# Patient Record
Sex: Female | Born: 1975 | Hispanic: No | Marital: Married | Smoking: Never smoker
Health system: Southern US, Community
[De-identification: ages and names within clinical notes are randomized; demographics above are authoritative.]

## PROBLEM LIST (undated history)

## (undated) ENCOUNTER — Inpatient Hospital Stay (HOSPITAL_COMMUNITY): Payer: Self-pay

## (undated) DIAGNOSIS — L309 Dermatitis, unspecified: Secondary | ICD-10-CM

## (undated) DIAGNOSIS — E049 Nontoxic goiter, unspecified: Secondary | ICD-10-CM

## (undated) DIAGNOSIS — Z87442 Personal history of urinary calculi: Secondary | ICD-10-CM

## (undated) DIAGNOSIS — Z8669 Personal history of other diseases of the nervous system and sense organs: Secondary | ICD-10-CM

## (undated) DIAGNOSIS — G8929 Other chronic pain: Secondary | ICD-10-CM

## (undated) DIAGNOSIS — E042 Nontoxic multinodular goiter: Secondary | ICD-10-CM

## (undated) DIAGNOSIS — M1990 Unspecified osteoarthritis, unspecified site: Secondary | ICD-10-CM

## (undated) DIAGNOSIS — K581 Irritable bowel syndrome with constipation: Secondary | ICD-10-CM

## (undated) DIAGNOSIS — S83207A Unspecified tear of unspecified meniscus, current injury, left knee, initial encounter: Secondary | ICD-10-CM

## (undated) DIAGNOSIS — K603 Anal fistula: Secondary | ICD-10-CM

## (undated) DIAGNOSIS — G4733 Obstructive sleep apnea (adult) (pediatric): Secondary | ICD-10-CM

## (undated) DIAGNOSIS — U071 COVID-19: Secondary | ICD-10-CM

## (undated) DIAGNOSIS — F329 Major depressive disorder, single episode, unspecified: Principal | ICD-10-CM

## (undated) DIAGNOSIS — K219 Gastro-esophageal reflux disease without esophagitis: Secondary | ICD-10-CM

## (undated) DIAGNOSIS — Z86011 Personal history of benign neoplasm of the brain: Secondary | ICD-10-CM

## (undated) DIAGNOSIS — N281 Cyst of kidney, acquired: Secondary | ICD-10-CM

## (undated) DIAGNOSIS — Z8711 Personal history of peptic ulcer disease: Secondary | ICD-10-CM

## (undated) DIAGNOSIS — R51 Headache: Secondary | ICD-10-CM

## (undated) DIAGNOSIS — N200 Calculus of kidney: Secondary | ICD-10-CM

## (undated) DIAGNOSIS — K649 Unspecified hemorrhoids: Secondary | ICD-10-CM

## (undated) DIAGNOSIS — R058 Other specified cough: Secondary | ICD-10-CM

## (undated) DIAGNOSIS — F419 Anxiety disorder, unspecified: Principal | ICD-10-CM

## (undated) DIAGNOSIS — Z8719 Personal history of other diseases of the digestive system: Secondary | ICD-10-CM

## (undated) DIAGNOSIS — G932 Benign intracranial hypertension: Secondary | ICD-10-CM

## (undated) DIAGNOSIS — F32A Depression, unspecified: Secondary | ICD-10-CM

## (undated) DIAGNOSIS — R519 Headache, unspecified: Secondary | ICD-10-CM

## (undated) HISTORY — PX: OTHER SURGICAL HISTORY: SHX169

## (undated) HISTORY — DX: Unspecified hemorrhoids: K64.9

## (undated) HISTORY — DX: Gastro-esophageal reflux disease without esophagitis: K21.9

## (undated) HISTORY — DX: Anxiety disorder, unspecified: F41.9

## (undated) HISTORY — DX: Nontoxic goiter, unspecified: E04.9

## (undated) HISTORY — DX: Major depressive disorder, single episode, unspecified: F32.9

## (undated) HISTORY — DX: Anal fistula: K60.3

---

## 1998-10-19 ENCOUNTER — Emergency Department (HOSPITAL_COMMUNITY): Admission: EM | Admit: 1998-10-19 | Discharge: 1998-10-19 | Payer: Self-pay | Admitting: Emergency Medicine

## 1998-10-19 ENCOUNTER — Encounter: Payer: Self-pay | Admitting: Emergency Medicine

## 1998-10-21 ENCOUNTER — Encounter: Admission: RE | Admit: 1998-10-21 | Discharge: 1998-10-21 | Payer: Self-pay | Admitting: Urology

## 1998-10-31 ENCOUNTER — Encounter: Payer: Self-pay | Admitting: Urology

## 1998-10-31 ENCOUNTER — Encounter: Admission: RE | Admit: 1998-10-31 | Discharge: 1998-10-31 | Payer: Self-pay | Admitting: Urology

## 2000-04-11 ENCOUNTER — Encounter: Payer: Self-pay | Admitting: Family Medicine

## 2000-04-11 ENCOUNTER — Ambulatory Visit (HOSPITAL_COMMUNITY): Admission: RE | Admit: 2000-04-11 | Discharge: 2000-04-11 | Payer: Self-pay | Admitting: Family Medicine

## 2000-06-16 ENCOUNTER — Ambulatory Visit (HOSPITAL_COMMUNITY): Admission: RE | Admit: 2000-06-16 | Discharge: 2000-06-16 | Payer: Self-pay | Admitting: Neurology

## 2000-06-16 ENCOUNTER — Encounter: Payer: Self-pay | Admitting: Neurology

## 2000-07-22 ENCOUNTER — Encounter: Admission: RE | Admit: 2000-07-22 | Discharge: 2000-10-20 | Payer: Self-pay | Admitting: Neurology

## 2000-08-05 ENCOUNTER — Ambulatory Visit (HOSPITAL_COMMUNITY): Admission: RE | Admit: 2000-08-05 | Discharge: 2000-08-05 | Payer: Self-pay | Admitting: Neurology

## 2002-01-11 HISTORY — PX: CHOLECYSTECTOMY: SHX55

## 2002-11-03 ENCOUNTER — Ambulatory Visit (HOSPITAL_COMMUNITY): Admission: RE | Admit: 2002-11-03 | Discharge: 2002-11-03 | Payer: Self-pay | Admitting: Internal Medicine

## 2002-11-03 ENCOUNTER — Encounter: Payer: Self-pay | Admitting: Internal Medicine

## 2002-11-19 ENCOUNTER — Ambulatory Visit (HOSPITAL_COMMUNITY): Admission: RE | Admit: 2002-11-19 | Discharge: 2002-11-19 | Payer: Self-pay | Admitting: Internal Medicine

## 2003-01-03 ENCOUNTER — Observation Stay (HOSPITAL_COMMUNITY): Admission: RE | Admit: 2003-01-03 | Discharge: 2003-01-04 | Payer: Self-pay | Admitting: General Surgery

## 2003-01-03 ENCOUNTER — Encounter (INDEPENDENT_AMBULATORY_CARE_PROVIDER_SITE_OTHER): Payer: Self-pay | Admitting: *Deleted

## 2003-01-03 HISTORY — PX: LAPAROSCOPIC CHOLECYSTECTOMY: SUR755

## 2003-11-03 ENCOUNTER — Emergency Department (HOSPITAL_COMMUNITY): Admission: EM | Admit: 2003-11-03 | Discharge: 2003-11-03 | Payer: Self-pay | Admitting: Family Medicine

## 2004-10-05 ENCOUNTER — Ambulatory Visit: Payer: Self-pay | Admitting: Nurse Practitioner

## 2004-10-15 ENCOUNTER — Ambulatory Visit: Payer: Self-pay | Admitting: *Deleted

## 2004-11-27 ENCOUNTER — Ambulatory Visit: Payer: Self-pay | Admitting: Nurse Practitioner

## 2004-12-02 ENCOUNTER — Ambulatory Visit: Payer: Self-pay | Admitting: Nurse Practitioner

## 2004-12-07 ENCOUNTER — Ambulatory Visit (HOSPITAL_COMMUNITY): Admission: RE | Admit: 2004-12-07 | Discharge: 2004-12-07 | Payer: Self-pay | Admitting: Internal Medicine

## 2004-12-11 ENCOUNTER — Ambulatory Visit: Payer: Self-pay | Admitting: Nurse Practitioner

## 2005-02-12 ENCOUNTER — Ambulatory Visit: Payer: Self-pay | Admitting: Nurse Practitioner

## 2005-03-16 ENCOUNTER — Ambulatory Visit: Payer: Self-pay | Admitting: Family Medicine

## 2005-03-16 ENCOUNTER — Ambulatory Visit (HOSPITAL_COMMUNITY): Admission: RE | Admit: 2005-03-16 | Discharge: 2005-03-16 | Payer: Self-pay | Admitting: Nurse Practitioner

## 2005-04-06 ENCOUNTER — Ambulatory Visit: Payer: Self-pay | Admitting: Nurse Practitioner

## 2005-05-05 ENCOUNTER — Ambulatory Visit: Payer: Self-pay | Admitting: Internal Medicine

## 2005-06-09 ENCOUNTER — Ambulatory Visit: Payer: Self-pay | Admitting: Internal Medicine

## 2009-02-07 ENCOUNTER — Emergency Department (HOSPITAL_COMMUNITY): Admission: EM | Admit: 2009-02-07 | Discharge: 2009-02-07 | Payer: Self-pay | Admitting: Emergency Medicine

## 2009-02-11 ENCOUNTER — Emergency Department (HOSPITAL_COMMUNITY): Admission: EM | Admit: 2009-02-11 | Discharge: 2009-02-11 | Payer: Self-pay | Admitting: Family Medicine

## 2010-05-29 NOTE — Procedures (Signed)
. [facility]  Patient:    Stephanie Davies                          MRN: [id number] Adm. Date:  [id number] Attending:  Erich Montane                           Procedure Report  ADDRESS:  [address], [place], [place] [address].  DESCRIPTION OF PROCEDURE:  The patient was prepped and draped in the left lateral decubitus position with Betadine.  The L4-5 interspace was entered without difficulty.  The opening pressure was 245 mmH2O.  Approximately 12 cc of CSF was removed and sent for studies, including cell count, differential, protein, glucose, and cryptococcal antigens, with two tubes to be held for further evaluation.  The patient tolerated the procedure well. DD:  08/05/00 TD:  08/05/00 Job: [id number]

## 2010-05-29 NOTE — Op Note (Signed)
NAMEJeanann Davies                             ACCOUNT NO.:  [id number]   MEDICAL RECORD NO.:  [id number]                   PATIENT TYPE:  OBV   LOCATION:  0340                                 FACILITY:  [facility]   PHYSICIAN:  Angelia Mould. Derrell Lolling, M.D.             DATE OF BIRTH:  02/15/1975   DATE OF PROCEDURE:  01/03/2003  DATE OF DISCHARGE:                                 OPERATIVE REPORT   PREOPERATIVE DIAGNOSES:  Chronic cholecystitis with cholelithiasis.   POSTOPERATIVE DIAGNOSES:  Chronic cholecystitis with cholelithiasis.   OPERATION PERFORMED:  Laparoscopic cholecystectomy with intraoperative  cholangiogram.   SURGEON:  Angelia Mould. Derrell Lolling, M.D.   FIRST ASSISTANT:  Anselm Pancoast. Zachery Dakins, M.D.   OPERATIVE INDICATIONS:  This is a 35 year old [place] female, who was sent  to me with a two month history of intermittent episodes of epigastric pain  and nausea.  She has occasional vomiting.  Her lab work and specifically her  liver function tests are normal.  A gallbladder ultrasound shows single, 14  mm gallstone and no other findings.  She is brought to the operating room  electively for cholecystectomy.   OPERATIVE FINDINGS:  The gallbladder was chronically inflamed.  There were  extensive adhesions of omentum to the gallbladder which required a fairly  extensive dissection.  The anatomy of the cystic duct and the cystic artery  and common bile duct were conventional.  The cystic duct was tiny but was  patent.  The cholangiogram showed normal intrahepatic and extrahepatic  biliary anatomy, no filling defects, and good flow of contrast into the  duodenum with no evidence of obstruction.  The liver looked normal.  The  stomach and duodenum, small intestine, large intestine were normal.  The  right ovary had a functional cyst on it.  The uterus could not be visualized  because the bladder was distended.   OPERATIVE TECHNIQUE:  Following the induction of general endotracheal  anesthesia, the patient's abdomen was prepped and draped in a sterile  fashion.  Marcaine 0.5% with epinephrine was used as a local infiltration  anesthetic.  A vertically-oriented incision was made at the lower rim of the  umbilicus.  The fascia was incised in the midline and the abdominal cavity  entered under direct vision.  A 10 mm Hasson trocar was inserted and secured  with a pursestring suture of 0 Vicryl.  Pneumoperitoneum was created.  Video  camera was inserted with visualization and findings as described above.  A  10 mm trocar was placed in the subxiphoid region and two 5 mm trocars placed  in the right mid abdomen.  The patient was positioned.  The gallbladder  fundus was elevated.  We took down all the adhesions off the gallbladder,  dissected out the infundibulum of the gallbladder.  We then dissected out  the cystic duct and the cystic artery.  The cystic artery was divided into  two branches.  The anterior branch was isolated as it wound around the  cystic duct, was secured with metal clips and divided.  The posterior branch  was then visualized as it went onto the gallbladder wall posteriorly, was  isolated, secured with multiple metal clips and divided.  This created a  large window behind the cystic duct.  The cystic duct was secured with a  metal clip close to the gallbladder.  The cholangiogram catheter was  inserted into the cystic duct.  A cholangiogram was obtained using the C-  arm.  The intrahepatic and extrahepatic biliary anatomy was normal.  There  were no filling defects, and there was good flow of contrast into the  duodenum on the cholangiogram.  The cholangiogram catheter was removed.  The  cystic duct was secured with multiple metal clips and divided.  The  gallbladder was dissected from its bed with electrocautery and removed  through the umbilical port.  The operative field was inspected and irrigated  a little bit.  It was completely dry.  The irrigation  fluid was completely  clear.  There was no evidence of any bleeding or bile leak whatsoever.  The  trocars were removed under direct vision, and there was no bleeding from the  trocar sites.  The pneumoperitoneum was released.  The fascia at the  umbilicus was closed with 0 Vicryl sutures.  The skin incisions were closed  with subcuticular sutures of 4-0 Vicryl and Steri-Strips.  Clean bandages  were placed and the patient taken to the recovery room in stable condition.  Estimated blood loss was about 10 cc.  Complications none.  Sponge, needle,  and instrument counts were correct.                                               Angelia Mould. Derrell Lolling, M.D.    HMI/MEDQ  D:  01/03/2003  T:  01/03/2003  Job:  [id number]   cc:   Tresa Endo L. Philipp Deputy, M.D.  [phone] [address]  [place] [address]  Fax: [phone]

## 2010-11-24 ENCOUNTER — Encounter: Payer: Self-pay | Admitting: Gastroenterology

## 2010-12-14 ENCOUNTER — Ambulatory Visit: Payer: Self-pay | Admitting: Gastroenterology

## 2010-12-30 ENCOUNTER — Ambulatory Visit: Payer: Self-pay | Admitting: Gastroenterology

## 2010-12-31 ENCOUNTER — Other Ambulatory Visit: Payer: Self-pay | Admitting: Specialist

## 2010-12-31 ENCOUNTER — Telehealth: Payer: Self-pay | Admitting: Gastroenterology

## 2010-12-31 DIAGNOSIS — G43909 Migraine, unspecified, not intractable, without status migrainosus: Secondary | ICD-10-CM

## 2010-12-31 DIAGNOSIS — G8929 Other chronic pain: Secondary | ICD-10-CM

## 2010-12-31 DIAGNOSIS — G932 Benign intracranial hypertension: Secondary | ICD-10-CM

## 2010-12-31 NOTE — Telephone Encounter (Signed)
Message copied by Arna Snipe on Thu Dec 31, 2010  4:57 PM ------      Message from: Donata Duff      Created: Wed Dec 30, 2010 10:52 AM       Please bill pt

## 2011-01-14 ENCOUNTER — Ambulatory Visit
Admission: RE | Admit: 2011-01-14 | Discharge: 2011-01-14 | Disposition: A | Discharge status: Home / Self Care | Payer: PRIVATE HEALTH INSURANCE | Source: Ambulatory Visit | Attending: Specialist | Admitting: Specialist

## 2011-01-14 ENCOUNTER — Other Ambulatory Visit: Payer: Self-pay | Admitting: Specialist

## 2011-01-14 DIAGNOSIS — G43909 Migraine, unspecified, not intractable, without status migrainosus: Secondary | ICD-10-CM

## 2011-01-14 DIAGNOSIS — G8929 Other chronic pain: Secondary | ICD-10-CM

## 2011-01-14 DIAGNOSIS — G932 Benign intracranial hypertension: Secondary | ICD-10-CM

## 2011-01-14 LAB — CSF CELL COUNT WITH DIFFERENTIAL
RBC Count, CSF: 0 cu mm
Tube #: 4
WBC, CSF: 0 cu mm (ref 0–5)

## 2011-01-14 LAB — PROTEIN, CSF: Total Protein, CSF: 29 mg/dL (ref 15–45)

## 2011-01-14 LAB — GLUCOSE, CSF: Glucose, CSF: 58 mg/dL (ref 43–76)

## 2011-01-14 NOTE — Patient Instructions (Signed)
Lumbar Puncture Discharge Instructions  1. Go home and rest quietly for the next 24 hours.  It is important to lie flat for the next 24 hours.  Get up only to go to the restroom.  You may lie in the bed or on a couch on your back, your stomach, your left side or your right side.  You may have one pillow under your head.  You may have pillows between your knees while you are on your side or under your knees while you are on your back.  2. DO NOT drive today.  Recline the seat as far back as it will go, while still wearing your seat belt, on the way home.  3. You may get up to go to the bathroom as needed.  You may sit up for 10 minutes to eat.  You may resume your normal diet and medications unless otherwise indicated. Drink lots of extra fluids for the next few days.  4. The incidence of headache, nausea, or vomiting is about 5% (one in 20 patients).  If you develop a headache, lie flat and drink plenty of fluids until the headache goes away.  Caffeinated beverages may be helpful.  If you develop severe nausea and vomiting or a headache that does not go away with flat bed rest, call [phone].  5. You may resume normal activities after your 24 hours of bed rest is over; however, do not exert yourself strongly or do any heavy lifting tomorrow.  6. Call your physician for a follow-up appointment.   7. If you have any questions or if complications develop after you arrive home, please call [phone].  Discharge instructions have been explained to the patient.  The patient, or the person responsible for the patient, fully understands these instructions.

## 2011-01-16 ENCOUNTER — Encounter (HOSPITAL_COMMUNITY): Payer: Self-pay | Admitting: Emergency Medicine

## 2011-01-16 ENCOUNTER — Emergency Department (INDEPENDENT_AMBULATORY_CARE_PROVIDER_SITE_OTHER)
Admission: EM | Admit: 2011-01-16 | Discharge: 2011-01-16 | Disposition: A | Discharge status: Home / Self Care | Payer: PRIVATE HEALTH INSURANCE | Source: Home / Self Care | Attending: Family Medicine | Admitting: Family Medicine

## 2011-01-16 DIAGNOSIS — G971 Other reaction to spinal and lumbar puncture: Secondary | ICD-10-CM

## 2011-01-16 LAB — ANGIOTENSIN CONVERTING ENZYME, CSF: ACE, CSF: 4 U/L (ref <=15)

## 2011-01-16 MED ORDER — ONDANSETRON HCL 4 MG PO TABS: 4.0000 mg | ORAL_TABLET | Freq: Three times a day (TID) | ORAL | Status: AC | PRN

## 2011-01-16 MED ORDER — OXYCODONE-ACETAMINOPHEN 5-325 MG PO TABS: ORAL_TABLET | ORAL | Status: AC

## 2011-01-16 NOTE — Discharge Instructions (Signed)
This could be a CSF leak from your recent lumbar puncture. Please call the center that performed the procedure for follow up. Until then, take medications as directed. Return to care should your symptoms not improve, or worsen in any way.

## 2011-01-16 NOTE — ED Provider Notes (Signed)
History     CSN: [id number]  Arrival date & time 01/16/11  1712   First MD Initiated Contact with Patient 01/16/11 1733      Chief Complaint  Patient presents with  . Migraine    (Consider location/radiation/quality/duration/timing/severity/associated sxs/prior treatment) HPI Comments: Stephanie Davies presents for evaluation of persistent headache after receiving a lumbar puncture 2 days ago. She states that she was referred by her neurologist for an MRI and lumbar puncture for evaluation of headaches (evaluating for migraine vs pseudotumor cerebri). She reports that she was told that she had elevated opening pressure, and some CSF was removed.   Patient is a 36 y.o. female presenting with headaches. The history is provided by the patient.  Headache The primary symptoms include headaches, dizziness and nausea. Primary symptoms do not include syncope, paresthesias, focal weakness, fever or vomiting. The symptoms began 2 days ago. The symptoms are unchanged. The neurological symptoms are diffuse.  The headache began 2 days ago. Headache is a new problem. The headache is present continuously. The pain from the headache is at a severity of 10/10. The headache is not associated with paresthesias or weakness.  Dizziness also occurs with nausea. Dizziness does not occur with vomiting or weakness.   Additional symptoms do not include weakness. Workup history includes MRI and lumbar puncture.    Past Medical History  Diagnosis Date  . Alopecia     Past Surgical History  Procedure Date  . Cholecystectomy     Family History  Problem Relation Age of Onset  . Esophageal cancer Maternal Grandmother   . Esophageal cancer Maternal Grandfather   . Diabetes Mother   . Diabetes Maternal Grandmother   . Diabetes Maternal Grandfather     History  Substance Use Topics  . Smoking status: Never Smoker   . Smokeless tobacco: Not on file  . Alcohol Use: No    OB History    Grav Para Term Preterm  Abortions TAB SAB Ect Mult Living                  Review of Systems  Constitutional: Negative.  Negative for fever.  Eyes: Negative.   Respiratory: Negative.   Cardiovascular: Negative.  Negative for syncope.  Gastrointestinal: Positive for nausea. Negative for vomiting.  Genitourinary: Negative.   Musculoskeletal: Negative.   Skin: Negative.   Neurological: Positive for dizziness and headaches. Negative for focal weakness, weakness, numbness and paresthesias.    Allergies  Review of patient's allergies indicates no known allergies.  Home Medications   Current Outpatient Rx  Name Route Sig Dispense Refill  . ONDANSETRON HCL 4 MG PO TABS Oral Take 1 tablet (4 mg total) by mouth every 8 (eight) hours as needed for nausea. 15 tablet 0  . OXYCODONE-ACETAMINOPHEN 5-325 MG PO TABS  Take one to two tablets every 4 to 6 hours as needed for pain 20 tablet 0    BP 138/90  Pulse 82  Temp(Src) 99.7 F (37.6 C) (Oral)  Resp 16  SpO2 99%  LMP 01/05/2011  Physical Exam  Nursing note and vitals reviewed. Constitutional: She is oriented to person, place, and time. She appears well-developed and well-nourished.  HENT:  Head: Normocephalic and atraumatic.  Right Ear: Tympanic membrane is retracted.  Left Ear: Tympanic membrane is retracted.  Mouth/Throat: Uvula is midline, oropharynx is clear and moist and mucous membranes are normal.  Eyes: Conjunctivae, EOM and lids are normal. Pupils are equal, round, and reactive to light.  Fundoscopic exam:  The right eye shows no hemorrhage and no papilledema.       The left eye shows no hemorrhage and no papilledema.  Neck: Normal range of motion.  Pulmonary/Chest: Effort normal and breath sounds normal. She has no wheezes.  Musculoskeletal: Normal range of motion.  Neurological: She is alert and oriented to person, place, and time.  Skin: Skin is warm and dry.  Psychiatric: Her behavior is normal.    ED Course  Procedures (including  critical care time)  Labs Reviewed - No data to display No results found.   1. Headache following lumbar puncture       MDM  Advised to follow up with imaging center that performed procedure for re-evaluation of possible CSF leak        Richardo Priest, MD 01/16/11 2059

## 2011-01-16 NOTE — ED Notes (Signed)
Pt here with severe frontal h/a radiating to top of head and bialt eyes that started last Thursday s/p lumbar puncture.pt states she seen neurologist for sx on thurs and mri done and was [facility]sx sharp constant shooting pain with blurry vision,dizziness and nausea.pt also states spinal fluid was 34 and should've been 12.

## 2011-01-16 NOTE — ED Notes (Signed)
Sx started Friday post puncture and mri

## 2011-01-21 ENCOUNTER — Encounter: Payer: Self-pay | Admitting: *Deleted

## 2011-01-21 ENCOUNTER — Encounter: Payer: PRIVATE HEALTH INSURANCE | Attending: Specialist | Admitting: *Deleted

## 2011-01-21 DIAGNOSIS — E669 Obesity, unspecified: Secondary | ICD-10-CM | POA: Insufficient documentation

## 2011-01-21 DIAGNOSIS — Z713 Dietary counseling and surveillance: Secondary | ICD-10-CM | POA: Insufficient documentation

## 2011-01-21 NOTE — Progress Notes (Signed)
  Medical Nutrition Therapy:  Appt start time: 1500 end time:  1600.   Assessment:  Primary concerns today: Patient here with her mother who listened throughout the visit. Patient states she is in school full time and also has a full time job. She is getting married in about 5 months and wants to lose as much weight as she can before the wedding so she "can look perfect on her wedding day". She also states she has headaches that have been associated with her obesity and that a quick weight loss in necessary to treat that condition too. She has already adjusted her eating habits significantly as demonstrated below. Her previous food choices included frequent fast food meals, excessive fried foods, sweets and especially large portions of chocolate candy daily. She is not currently physically active other than walking to her classes when in school. She also states she cannot do any activity that involves bending over due to headaches.   MEDICATIONS: see list   DIETARY INTAKE:  Usual eating pattern includes 3 meals and 1-3 snacks per day.  Everyday foods currently include good variety from all food groups.  Avoided foods include soda, juices, rice and pasta lately  caffeine  24-hr recall:  B ( AM): 2 boiled eggs, [place] yogurt, water  Snk ( AM): fresh fruit  L ( PM): salad, fried fish, grilled chicken or [place], mixed vegetables or soup (previously large portions of sweets, rice, pasta sodas) Snk ( PM): fresh fruit, yogurt, looking for something  Sweet (used to eat chocolate candy) D ( PM): yogurt, milk, occasionally salad (lettuce, tomato, onion, cucumber, lemon and salt) Snk ( PM): not usually, if so- an orange Beverages: water,   Usual physical activity: walks to classes when in school, otherwise no  Estimated energy needs: 1400 calories 158 g carbohydrates 105 g protein 39 g fat  Progress Towards Goal(s):  In progress.   Nutritional Diagnosis:  NI-1.5 Excessive energy intake As  related to obesity and lack of exercise.  As evidenced by BMI of 38.9%.    Intervention:  Nutrition counseling provided on adequate nutrition for weight loss of up to 2 pounds per week. Also discussed value of increasing her activity level and suggested several options including water aerobics and other aerobic choices. Cautioned her about her comment of looking perfect on her wedding day and that their marriage would be based on their relationship that they build together, not what each of them looks like on that particular day. Also cautioned her on the risks of losing too much weight too quickly in terms of muscle mass and potential headaches from too little food.  Goals:  Eat 3 meals/day, Avoid meal skipping   Consider following "Plate Method" for portion control and to increase vegetable servings  Limit carbohydrate1-2 servings/meal   Choose more whole grains, lean protein, low-fat dairy, and fruits/non-starchy vegetables.   Aim for >30 min of physical activity daily such as water aerobics, bicycling, fast walking as tolerated with headaches  Limit sugar-sweetened beverages and concentrated sweets  Read Food Labels for Total Carbohydrate, Fat and Sodium content of foods   Handouts given during visit include:  Carb Counting and Label Reading Handouts  Meal Plan Card  Monitoring/Evaluation:  Dietary intake, exercise, label reading, and body weight in 4 week(s).

## 2011-01-22 NOTE — Patient Instructions (Signed)
Goals:  Eat 3 meals/day, Avoid meal skipping   Consider following "Plate Method" for portion control and to increase vegetable servings  Limit carbohydrate1-2 servings/meal   Choose more whole grains, lean protein, low-fat dairy, and fruits/non-starchy vegetables.   Aim for >30 min of physical activity daily such as water aerobics, bicycling, fast walking as tolerated with headaches  Limit sugar-sweetened beverages and concentrated sweets  Read Food Labels for Total Carbohydrate, Fat and Sodium content of foods

## 2011-02-12 LAB — FUNGUS CULTURE W SMEAR: Smear Result: NONE SEEN

## 2011-02-22 ENCOUNTER — Ambulatory Visit: Payer: PRIVATE HEALTH INSURANCE | Admitting: *Deleted

## 2011-08-23 ENCOUNTER — Encounter (HOSPITAL_COMMUNITY): Payer: Self-pay | Admitting: Emergency Medicine

## 2011-08-23 ENCOUNTER — Encounter (HOSPITAL_COMMUNITY): Payer: Self-pay

## 2011-08-23 ENCOUNTER — Emergency Department (INDEPENDENT_AMBULATORY_CARE_PROVIDER_SITE_OTHER)
Admission: EM | Admit: 2011-08-23 | Discharge: 2011-08-23 | Disposition: A | Discharge status: Other Acute Inpatient Hospital | Payer: Self-pay | Source: Home / Self Care | Attending: Emergency Medicine | Admitting: Emergency Medicine

## 2011-08-23 ENCOUNTER — Emergency Department (HOSPITAL_COMMUNITY)
Admission: EM | Admit: 2011-08-23 | Discharge: 2011-08-24 | Disposition: A | Discharge status: Home / Self Care | Payer: PRIVATE HEALTH INSURANCE | Attending: Emergency Medicine | Admitting: Emergency Medicine

## 2011-08-23 DIAGNOSIS — G932 Benign intracranial hypertension: Secondary | ICD-10-CM

## 2011-08-23 DIAGNOSIS — R519 Headache, unspecified: Secondary | ICD-10-CM

## 2011-08-23 DIAGNOSIS — R51 Headache: Secondary | ICD-10-CM | POA: Insufficient documentation

## 2011-08-23 HISTORY — DX: Benign intracranial hypertension: G93.2

## 2011-08-23 LAB — POCT URINALYSIS DIP (DEVICE)
Bilirubin Urine: NEGATIVE
Glucose, UA: NEGATIVE mg/dL
Hgb urine dipstick: NEGATIVE
Ketones, ur: NEGATIVE mg/dL
Leukocytes, UA: NEGATIVE
Nitrite: NEGATIVE
Protein, ur: NEGATIVE mg/dL
Specific Gravity, Urine: 1.02 (ref 1.005–1.030)
Urobilinogen, UA: 0.2 mg/dL (ref 0.0–1.0)
pH: 5.5 (ref 5.0–8.0)

## 2011-08-23 LAB — POCT PREGNANCY, URINE: Preg Test, Ur: NEGATIVE

## 2011-08-23 MED ORDER — TETRACAINE HCL 0.5 % OP SOLN
1.0000 [drp] | Freq: Once | OPHTHALMIC | Status: AC
  Administered 2011-08-23: 1 [drp] via OPHTHALMIC
  Filled 2011-08-23: qty 2

## 2011-08-23 MED ORDER — IBUPROFEN 800 MG PO TABS
800.0000 mg | ORAL_TABLET | Freq: Once | ORAL | Status: AC
  Administered 2011-08-24: 800 mg via ORAL
  Filled 2011-08-23: qty 1

## 2011-08-23 NOTE — ED Provider Notes (Signed)
History     CSN: [id number]  Arrival date & time 08/23/11  1751   First MD Initiated Contact with Patient 08/23/11 2103      Chief Complaint  Patient presents with  . Headache    (Consider location/radiation/quality/duration/timing/severity/associated sxs/prior treatment) HPI Comments: Patient is a 36 year old female with a recent diagnosis of pseudotumor cerebri that presents to the emergency department from urgent care with a chief complaint of headache.  Patient is currently being followed by Dr. Neale Burly this.  Headache onset began approximately 3 weeks ago, location is bifrontal, characterized as gradually worsening, & described as a throbbing sensation associated with intermittent dizziness. She denies any nausea, vomiting, change in vision, tinnitus, paresthesias, numbness, weakness, tingling sensation, slurred speech, ataxia or disequilibrium.  Patient states her headaches were treated with a lumbar puncture 3 months ago which did not relieve her pain. Later received multiple trigger point injections which only temporarily relieved her headache.  Since this time her headache has been slowly coming back.  She has no other complaints at this time. She denies being placed on any medication for her ICP.   Patient is a 36 y.o. female presenting with headaches.  Headache  Pertinent negatives include no fever.    Past Medical History  Diagnosis Date  . Alopecia   . Pseudotumor cerebri     Past Surgical History  Procedure Date  . Cholecystectomy     Family History  Problem Relation Age of Onset  . Esophageal cancer Maternal Grandmother   . Esophageal cancer Maternal Grandfather   . Diabetes Mother   . Diabetes Maternal Grandmother   . Diabetes Maternal Grandfather     History  Substance Use Topics  . Smoking status: Never Smoker   . Smokeless tobacco: Not on file  . Alcohol Use: No    OB History    Grav Para Term Preterm Abortions TAB SAB Ect Mult Living        Review of Systems  Constitutional: Negative for fever, diaphoresis and activity change.  HENT: Negative for congestion and neck pain.   Eyes: Negative for photophobia, pain, discharge, redness, itching and visual disturbance.  Respiratory: Negative for cough.   Genitourinary: Negative for dysuria.  Musculoskeletal: Negative for myalgias.  Skin: Negative for color change and wound.  Neurological: Positive for dizziness and headaches. Negative for tremors, seizures, syncope, facial asymmetry, speech difficulty, weakness, light-headedness and numbness.  All other systems reviewed and are negative.    Allergies  Review of patient's allergies indicates no known allergies.  Home Medications  No current outpatient prescriptions on file.  BP 122/79  Pulse 93  Temp 98.5 F (36.9 C) (Oral)  Resp 18  SpO2 99%  LMP 08/14/2011  Physical Exam  Nursing note and vitals reviewed. Constitutional: She is oriented to person, place, and time. She appears well-developed and well-nourished. No distress.  HENT:  Head: Normocephalic and atraumatic.  Eyes: Conjunctivae and EOM are normal. Pupils are equal, round, and reactive to light. No scleral icterus.       IOPs < 20 bilaterally. No Visual field deficits.   Neck: Normal range of motion and full passive range of motion without pain. Neck supple. No JVD present. Carotid bruit is not present. No rigidity. No Brudzinski's sign noted.  Cardiovascular: Normal rate, regular rhythm, normal heart sounds and intact distal pulses.   Pulmonary/Chest: Effort normal and breath sounds normal. No respiratory distress. She has no wheezes. She has no rales.  Musculoskeletal: Normal range of motion.  Lymphadenopathy:    She has no cervical adenopathy.  Neurological: She is alert and oriented to person, place, and time. She has normal strength. No cranial nerve deficit or sensory deficit. She displays a negative Romberg sign. Coordination and gait normal. GCS  eye subscore is 4. GCS verbal subscore is 5. GCS motor subscore is 6.       A&O x3.  Able to follow commands. CN III-XII intact  Motor strength 5/5 bilaterally. Light touch intact in all 4 distal limbs.  Intact finger to nose, shin to heel and rapid alternating movements. No ataxia or dysequilibrium.   Skin: Skin is warm and dry. No rash noted. She is not diaphoretic.  Psychiatric: She has a normal mood and affect. Her behavior is normal.    ED Course  Procedures (including critical care time)  Labs Reviewed - No data to display No results found.   No diagnosis found.    MDM  HA  Patient with diagnosis of pseudotumor cerebri followed by Dr. Neale Burly presents to the emergency department with a gradually worsening headache over the last 3 weeks.  No focal neuro deficits or objective findings on physical exam.  Vision and intraocular pressures within normal limits.  Patient has been advised to followup with her neurologist and to return to the emergency department if symptoms worsen or she develops any visual changes.  Recommended low sodium diet and weight reduction and advised patient to discuss prophylactic medication with her physician. At this time there does not appear to be any evidence of an acute emergency medical condition and the patient appears stable for discharge with appropriate outpatient follow up. Pt case discussed with Dr. Judd Lien who agrees with my plan.          Jaci Carrel, [place] 08/23/11 2339

## 2011-08-23 NOTE — ED Notes (Signed)
Pt sent from [facility] for increased HA and dizziness x 3 weeks; pt with hx of pseudotumor cerebri and sent for further eval

## 2011-08-23 NOTE — ED Provider Notes (Signed)
Chief Complaint  Patient presents with  . Headache    History of Present Illness:   The patient is a 36 year old female with a history of pseudotumor cerebri who presents today with a three-week history of a constant bifrontal headache. This feels like a sharp pain and is rated 10 over 10 in intensity at times, however right now it is a 5/10. It's associated with nausea, eye pain, photophobia, and photophobia. She denies any vomiting. She's had no blurry vision or diplopia. No difficulty hearing, ringing in her ears, paresthesias, numbness, tingling, weakness, difficulty speaking, difficulty ambulating, or passing out spells. She also has had a three-week history of intermittent dizziness. This comes and goes and lasts for seconds at a time. The headache tends to get worse during the dizzy spells. There no specific precipitating or relieving factors. She denies any whirling vertigo, but feels like she is about to fall. Both her ears itch but she denies any ear pain. She does have a history of pseudotumor cerebri which was diagnosed about 6 months ago. She underwent an LP and has been followed by Dr. Santiago Glad. She's had ongoing headaches since then and received trigger point injections, but she states the headaches are worse the past 3 weeks. She denies any fever, chills, sweats, or stiff neck.  Review of Systems:  Other than noted above, the patient denies any of the following symptoms: Systemic:  No fever, chills, fatigue, photophobia, stiff neck. Eye:  No redness, eye pain, discharge, blurred vision, or diplopia. ENT:  No nasal congestion, rhinorrhea, sinus pressure or pain, sneezing, earache, or sore throat.  No jaw claudication. Neuro:  No paresthesias, loss of consciousness, seizure activity, muscle weakness, trouble with coordination or gait, trouble speaking or swallowing. Psych:  No depression, anxiety or trouble sleeping.  PMFSH:  Past medical history, family history, social history,  meds, and allergies were reviewed.  Physical Exam:   Vital signs:  BP 112/74  Pulse 98  Temp 98.4 F (36.9 C) (Oral)  Resp 18  SpO2 96%  LMP 08/14/2011 General:  Alert and oriented.  In no distress. Eye:  Lids and conjunctivas normal.  PERRL,  Full EOMs.  Fundi benign with normal discs and vessels. ENT:  No cranial or facial tenderness to palpation.  TMs and canals clear.  Nasal mucosa was normal and uncongested without any drainage. No intra oral lesions, pharynx clear, mucous membranes moist, dentition normal. Neck:  Supple, full ROM, no tenderness to palpation.  No adenopathy or mass. Neuro:  Alert and orented times 3.  Speech was clear, fluent, and appropriate.  Cranial nerves intact. No pronator drift, muscle strength normal. Finger to nose normal.  DTRs were 2+ and symmetrical.Station and gait were normal.  Romberg's sign was normal.  Able to perform tandem gait well. Psych:  Normal affect.  Assessment:  The encounter diagnosis was Pseudotumor cerebri. It sounds like her pseudotumor has come back again, and I feel she needs further evaluation, possibly a CT scan to assess.  Plan:   1.  The following meds were prescribed:   New Prescriptions   No medications on file   2.  The patient was transferred to the emergency department by shuttle.   Reuben Likes, MD 08/23/11 1745

## 2011-08-23 NOTE — ED Notes (Signed)
Stated history of pseudotumor cerebri ; c/o HA for past 3 weeks , no relief w OTC medications; c/o dizziness that comes and goes , pain in her ear, nausea, but no vomiting; LMP 8-3

## 2011-08-23 NOTE — ED Notes (Signed)
Pt c/o 10/10 HA, PA notified, new orders placed.

## 2011-08-24 NOTE — ED Provider Notes (Signed)
Medical screening examination/treatment/procedure(s) were performed by non-physician practitioner and as supervising physician I was immediately available for consultation/collaboration.  Issac Moure, MD 08/24/11 0130 

## 2013-01-11 NOTE — L&D Delivery Note (Signed)
Delivery Note  Second stage started at -2 pushed times 2 hours progress made to +2 descent and became frustrated, requested vacuum and refused to push any further.  Dr. Despina Hidden consulted to discuss vacuum. Discussion between patient and healthcare team to continue to encourage pushing due to adequate progress with decent. Dr. Despina Hidden and CNM remain at bedside throughout remainder of second stage. At 3:15 AM a viable and healthy female was delivered via Vaginal, Spontaneous Delivery (Presentation: ; R Occiput Anterior).  APGAR: 6, 8. Placenta status: delivered shultz , .  Cord: 3 vessels with the following complications: None.  Cord pH: attempted to obtain, unable to aspirate sufficient sample size.  Anesthesia: Epidural  Lacerations: 1st degree Suture Repair: 3.0 Monocryl Est. Blood Loss (mL):  Mom to postpartum.  Baby to Couplet care / Skin to Skin Supervised by Wynelle Bourgeois CNM.  Lind Covert 09/03/2013, 4:03 AM\  Attended delivery Agree with note Aviva Signs, CNM

## 2013-05-14 ENCOUNTER — Encounter (HOSPITAL_COMMUNITY): Payer: Self-pay | Admitting: Emergency Medicine

## 2013-05-14 ENCOUNTER — Emergency Department (INDEPENDENT_AMBULATORY_CARE_PROVIDER_SITE_OTHER)
Admission: EM | Admit: 2013-05-14 | Discharge: 2013-05-14 | Disposition: A | Discharge status: Home / Self Care | Payer: PRIVATE HEALTH INSURANCE | Source: Home / Self Care

## 2013-05-14 DIAGNOSIS — Z3490 Encounter for supervision of normal pregnancy, unspecified, unspecified trimester: Secondary | ICD-10-CM

## 2013-05-14 DIAGNOSIS — Z3201 Encounter for pregnancy test, result positive: Secondary | ICD-10-CM

## 2013-05-14 LAB — POCT PREGNANCY, URINE: Preg Test, Ur: POSITIVE — AB

## 2013-05-14 MED ORDER — PRENATAL VITAMINS 28-0.8 MG PO TABS: ORAL_TABLET | ORAL | Status: DC

## 2013-05-14 NOTE — ED Notes (Signed)
[place]. Needs pregnancy confirmation for services

## 2013-05-14 NOTE — Discharge Instructions (Signed)
Prenatal Care  °WHAT IS PRENATAL CARE?  °Prenatal care means health care during your pregnancy, before your baby is born. It is very important to take care of yourself and your baby during your pregnancy by:  °· Getting early prenatal care. If you know you are pregnant, or think you might be pregnant, call your health care provider as soon as possible. Schedule a visit for a prenatal exam. °· Getting regular prenatal care. Follow your health care provider's schedule for blood and other necessary tests. Do not miss appointments. °· Doing everything you can to keep yourself and your baby healthy during your pregnancy. °· Getting complete care. Prenatal care should include evaluation of the medical, dietary, educational, psychological, and social needs of you and your significant other. The medical and genetic history of your family and the family of your baby's father should be discussed with your health care provider. °· Discussing with your health care provider: °· Prescription, over-the-counter, and herbal medicines that you take. °· Any history of substance abuse, alcohol use, smoking, and illegal drug use. °· Any history of domestic abuse and violence. °· Immunizations you have received. °· Your nutrition and diet. °· The amount of exercise you do. °· Any environmental and occupational hazards to which you are exposed. °· History of sexually transmitted infections for both you and your partner. °· Previous pregnancies you have had. °WHY IS PRENATAL CARE SO IMPORTANT?  °By regularly seeing your health care provider, you help ensure that problems can be identified early so that they can be treated as soon as possible. Other problems might be prevented. Many studies have shown that early and regular prenatal care is important for the health of mothers and their babies.  °HOW CAN I TAKE CARE OF MYSELF WHILE I AM PREGNANT?  °Here are ways to take care of yourself and your baby:  °· Start or continue taking your  multivitamin with 400 micrograms (mcg) of folic acid every day. °· Get early and regular prenatal care. It is very important to see a health care provider during your pregnancy. Your health care provider will check at each visit to make sure that you and the baby are healthy. If there are any problems, action can be taken right away to help you and the baby. °· Eat a healthy diet that includes: °· Fruits. °· Vegetables. °· Foods low in saturated fat. °· Whole grains. °· Calcium-rich foods, such as milk, yogurt, and hard cheeses. °· Drink 6 to 8 glasses of liquids a day. °· Unless your health care provider tells you not to, try to be physically active for 30 minutes, most days of the week. If you are pressed for time, you can get your activity in through 10-minute segments, three times a day. °· Do not smoke, drink alcohol, or use drugs. These can cause long-term damage to your baby. Talk with your health care provider about steps to take to stop smoking. Talk with a member of your faith community, a counselor, a trusted friend, or your health care provider if you are concerned about your alcohol or drug use. °· Ask your health care provider before taking any medicine, even over-the-counter medicines. Some medicines are not safe to take during pregnancy. °· Get plenty of rest and sleep. °· Avoid hot tubs and saunas during pregnancy. °· Do not have X-rays taken unless absolutely necessary and with the recommendation of your health care provider. A lead shield can be placed on your abdomen to protect the   baby when X-rays are taken in other parts of the body. °· Do not empty the cat litter when you are pregnant. It may contain a parasite that causes an infection called toxoplasmosis, which can cause birth defects. Also, use gloves when working in garden areas used by cats. °· Do not eat uncooked or undercooked meats or fish. °· Do not eat soft, mold-ripened cheeses (Brie, Camembert, and chevre) or soft, blue-veined  cheese (Danish blue and Roquefort). °· Stay away from toxic chemicals like: °· Insecticides. °· Solvents (some cleaners or paint thinners). °· Lead. °· Mercury. °· Sexual intercourse may continue until the end of the pregnancy, unless you have a medical problem or there is a problem with the pregnancy and your health care provider tells you not to. °· Do not wear high-heel shoes, especially during the second half of the pregnancy. You can lose your balance and fall. °· Do not take long trips, unless absolutely necessary. Be sure to see your health care provider before going on the trip. °· Do not sit in one position for more than 2 hours when on a trip. °· Take a copy of your medical records when going on a trip. Know where a hospital is located in the city you are visiting, in case of an emergency. °· Most dangerous household products will have pregnancy warnings on their labels. Ask your health care provider about products if you are unsure. °· Limit or eliminate your caffeine intake from coffee, tea, sodas, medicines, and chocolate. °· Many women continue working through pregnancy. Staying active might help you stay healthier. If you have a question about the safety or the hours you work at your particular job, talk with your health care provider. °· Get informed: °· Read books. °· Watch videos. °· Go to childbirth classes for you and your significant other. °· Talk with experienced moms. °· Ask your health care provider about childbirth education classes for you and your partner. Classes can help you and your partner prepare for the birth of your baby. °· Ask about a baby doctor (pediatrician) and methods and pain medicine for labor, delivery, and possible cesarean delivery. °HOW OFTEN SHOULD I SEE MY HEALTH CARE PROVIDER DURING PREGNANCY?  °Your health care provider will give you a schedule for your prenatal visits. You will have visits more often as you get closer to the end of your pregnancy. An average  pregnancy lasts about 40 weeks.  °A typical schedule includes visiting your health care provider:  °· About once each month during your first 6 months of pregnancy. °· Every 2 weeks during the next 2 months. °· Weekly in the last month, until the delivery date. °Your health care provider will probably want to see you more often if: °· You are older than 35 years. °· Your pregnancy is high risk because you have certain health problems or problems with the pregnancy, such as: °· Diabetes. °· High blood pressure. °· The baby is not growing on schedule, according to the dates of the pregnancy. °Your health care provider will do special tests to make sure you and the baby are not having any serious problems. °WHAT HAPPENS DURING PRENATAL VISITS?  °· At your first prenatal visit, your health care provider will do a physical exam and talk to you about your health history and the health history of your partner and your family. Your health care provider will be able to tell you what date to expect your baby to be born on. °·   Your first physical exam will include checks of your blood pressure, measurements of your height and weight, and an exam of your pelvic organs. Your health care provider will do a Pap test if you have not had one recently and will do cultures of your cervix to make sure there is no infection. °· At each prenatal visit, there will be tests of your blood, urine, blood pressure, weight, and checking the progress of the baby. °· At your later prenatal visits, your health care provider will check how you are doing and how the baby is developing. You may have a number of tests done as your pregnancy progresses. °· Ultrasound exams are often used to check on the baby's growth and health. °· You may have more urine and blood tests, as well as special tests, if needed. These may include amniocentesis to examine fluid in the pregnancy sac, stress tests to check how the baby responds to contractions, or a  biophysical profile to measure fetus well-being. Your health care provider will explain the tests and why they are necessary. °· You should discuss with your health care provider your plans to breastfeed or bottle-feed your baby. °· Each visit is also a chance for you to learn about staying healthy during pregnancy and to ask questions. °Document Released: 12/31/2002 Document Revised: 10/18/2012 Document Reviewed: 06/15/2012 °ExitCare® Patient Information ©2014 [employer]. ° °

## 2013-05-14 NOTE — ED Provider Notes (Signed)
CSN: [id number]     Arrival date & time 05/14/13  1025 History   First MD Initiated Contact with Patient 05/14/13 1210     Chief Complaint  Patient presents with  . Possible Pregnancy   (Consider location/radiation/quality/duration/timing/severity/associated sxs/prior Treatment) HPI Comments: 38 y o F from [place], recently arrived to [place] to have her birth in country. She had been under the care of an Obstetrician in [place] prior to arrival. LMP 11/17/12. She is requesting a confirmation note of pregnancy to start the process of Medicaid. Denies medical problems.   Past Medical History  Diagnosis Date  . Alopecia   . Pseudotumor cerebri    Past Surgical History  Procedure Laterality Date  . Cholecystectomy     Family History  Problem Relation Age of Onset  . Esophageal cancer Maternal Grandmother   . Esophageal cancer Maternal Grandfather   . Diabetes Mother   . Diabetes Maternal Grandmother   . Diabetes Maternal Grandfather    History  Substance Use Topics  . Smoking status: Never Smoker   . Smokeless tobacco: Not on file  . Alcohol Use: No   OB History   Grav Para Term Preterm Abortions TAB SAB Ect Mult Living                 Review of Systems  All other systems reviewed and are negative.   Allergies  Review of patient's allergies indicates no known allergies.  Home Medications   Prior to Admission medications   Not on File   BP 133/96  Pulse 90  Temp(Src) 98.5 F (36.9 C) (Oral)  Resp 18  SpO2 98%  LMP 11/17/2012 Physical Exam  Nursing note and vitals reviewed. Constitutional: She is oriented to person, place, and time. She appears well-developed and well-nourished. No distress.  Cardiovascular: Normal rate.   Pulmonary/Chest: Effort normal. No respiratory distress.  Neurological: She is alert and oriented to person, place, and time. She exhibits normal muscle tone.  Skin: Skin is warm and dry.  Psychiatric: She has a normal mood and affect.    ED  Course  Procedures (including critical care time) Labs Review Labs Reviewed  POCT PREGNANCY, URINE - Abnormal; Notable for the following:    Preg Test, Ur POSITIVE (*)    All other components within normal limits    Imaging Review No results found.   MDM   1. Pregnancy     Prenatal vitamins Note of pregnancy.    Hayden Rasmussenavid Peyson Postema, NP 05/14/13 [phone]

## 2013-05-14 NOTE — ED Provider Notes (Signed)
Medical screening examination/treatment/procedure(s) were performed by non-physician practitioner and as supervising physician I was immediately available for consultation/collaboration.  Lorris Carducci, M.D.  Beryl Hornberger C Bennie Chirico, MD 05/14/13 1941 

## 2013-05-19 ENCOUNTER — Encounter (HOSPITAL_COMMUNITY): Payer: Self-pay

## 2013-05-19 ENCOUNTER — Emergency Department (HOSPITAL_COMMUNITY)
Admission: EM | Admit: 2013-05-19 | Discharge: 2013-05-19 | Disposition: A | Discharge status: Home / Self Care | Payer: Self-pay

## 2013-05-19 ENCOUNTER — Inpatient Hospital Stay (HOSPITAL_COMMUNITY)
Admission: AD | Admit: 2013-05-19 | Discharge: 2013-05-19 | Disposition: A | Discharge status: Home / Self Care | Payer: Medicaid Other | Source: Ambulatory Visit | Attending: Family Medicine | Admitting: Family Medicine

## 2013-05-19 ENCOUNTER — Inpatient Hospital Stay (HOSPITAL_COMMUNITY): Payer: Medicaid Other

## 2013-05-19 DIAGNOSIS — O99891 Other specified diseases and conditions complicating pregnancy: Secondary | ICD-10-CM | POA: Insufficient documentation

## 2013-05-19 DIAGNOSIS — K5900 Constipation, unspecified: Secondary | ICD-10-CM | POA: Insufficient documentation

## 2013-05-19 DIAGNOSIS — R109 Unspecified abdominal pain: Secondary | ICD-10-CM

## 2013-05-19 DIAGNOSIS — R300 Dysuria: Secondary | ICD-10-CM | POA: Insufficient documentation

## 2013-05-19 DIAGNOSIS — O9989 Other specified diseases and conditions complicating pregnancy, childbirth and the puerperium: Principal | ICD-10-CM

## 2013-05-19 LAB — URINALYSIS, ROUTINE W REFLEX MICROSCOPIC
Bilirubin Urine: NEGATIVE
Glucose, UA: NEGATIVE mg/dL
Hgb urine dipstick: NEGATIVE
Ketones, ur: NEGATIVE mg/dL
Leukocytes, UA: NEGATIVE
Nitrite: NEGATIVE
Protein, ur: NEGATIVE mg/dL
Specific Gravity, Urine: 1.025 (ref 1.005–1.030)
Urobilinogen, UA: 0.2 mg/dL (ref 0.0–1.0)
pH: 6 (ref 5.0–8.0)

## 2013-05-19 LAB — WET PREP, GENITAL
Clue Cells Wet Prep HPF POC: NONE SEEN
Trich, Wet Prep: NONE SEEN
Yeast Wet Prep HPF POC: NONE SEEN

## 2013-05-19 MED ORDER — DOCUSATE SODIUM 100 MG PO CAPS: 100.0000 mg | ORAL_CAPSULE | Freq: Two times a day (BID) | ORAL | Status: DC

## 2013-05-19 MED ORDER — HYDROCODONE-ACETAMINOPHEN 5-325 MG PO TABS: 1.0000 | ORAL_TABLET | Freq: Four times a day (QID) | ORAL | Status: DC | PRN

## 2013-05-19 NOTE — Progress Notes (Signed)
Phone answered by Dr. Shawnie PonsPratt. Asked if [facility] provider could see pt and Dr. Shawnie PonsPratt agreed

## 2013-05-19 NOTE — Progress Notes (Signed)
Notified of Dr. Shawnie PonsPratt request to see pt. Agreed and will see pt

## 2013-05-19 NOTE — Progress Notes (Signed)
Notified of pt arrival in [facility]. Will come see pt 

## 2013-05-19 NOTE — MAU Provider Note (Signed)
Attestation of Attending Supervision of Advanced Practitioner (PA/CNM/NP): Evaluation and management procedures were performed by the Advanced Practitioner under my supervision and collaboration.  I have reviewed the Advanced Practitioner's note and chart, and I agree with the management and plan.  Gavin Faivre S Kesa Birky, MD [facility] Attending 05/19/2013 5:38 PM   

## 2013-05-19 NOTE — Discharge Instructions (Signed)
Abdominal Pain During Pregnancy °Abdominal pain is common in pregnancy. Most of the time, it does not cause harm. There are many causes of abdominal pain. Some causes are more serious than others. Some of the causes of abdominal pain in pregnancy are easily diagnosed. Occasionally, the diagnosis takes time to understand. Other times, the cause is not determined. Abdominal pain can be a sign that something is very wrong with the pregnancy, or the pain may have nothing to do with the pregnancy at all. For this reason, always tell your health care provider if you have any abdominal discomfort. °HOME CARE INSTRUCTIONS  °Monitor your abdominal pain for any changes. The following actions may help to alleviate any discomfort you are experiencing: °· Do not have sexual intercourse or put anything in your vagina until your symptoms go away completely. °· Get plenty of rest until your pain improves. °· Drink clear fluids if you feel nauseous. Avoid solid food as long as you are uncomfortable or nauseous. °· Only take over-the-counter or prescription medicine as directed by your health care provider. °· Keep all follow-up appointments with your health care provider. °SEEK IMMEDIATE MEDICAL CARE IF: °· You are bleeding, leaking fluid, or passing tissue from the vagina. °· You have increasing pain or cramping. °· You have persistent vomiting. °· You have painful or bloody urination. °· You have a fever. °· You notice a decrease in your baby's movements. °· You have extreme weakness or feel faint. °· You have shortness of breath, with or without abdominal pain. °· You develop a severe headache with abdominal pain. °· You have abnormal vaginal discharge with abdominal pain. °· You have persistent diarrhea. °· You have abdominal pain that continues even after rest, or gets worse. °MAKE SURE YOU:  °· Understand these instructions. °· Will watch your condition. °· Will get help right away if you are not doing well or get  worse. °Document Released: 12/28/2004 Document Revised: 10/18/2012 Document Reviewed: 07/27/2012 °ExitCare® Patient Information ©2014 [employer]. ° °

## 2013-05-19 NOTE — MAU Provider Note (Signed)
Chief Complaint:  Flank Pain and Burning when voiding    Stephanie Davies is a 38 y.o.  G1P0 with IUP at 139w6d presenting for Flank Pain and Burning when voiding   Pt just moved back from [place] last week and has not established care in this country. She has had 3 days of left flank pain that is constant.  shrp pain that radiates around her side to her abdomen.  Does get worse with laying on her left side.  Some nausea. No vomiting.  +constipation and gas and initially thought her symptoms were related to this but she had a bowel movement this AM and her symptoms did not improve.  Due to this, she decided to come in.  + dysuria and increased frequency. No hematuria.   No ctx, lof, vb.  +FM.  PNC prior in [place]. Wants to establish care in [place].      Menstrual History: OB History   Grav Para Term Preterm Abortions TAB SAB Ect Mult Living   1                Patient's last menstrual period was 11/17/2012.      Past Medical History  Diagnosis Date  . Alopecia   . Pseudotumor cerebri     Past Surgical History  Procedure Laterality Date  . Cholecystectomy      Family History  Problem Relation Age of Onset  . Esophageal cancer Maternal Grandmother   . Esophageal cancer Maternal Grandfather   . Diabetes Mother   . Diabetes Maternal Grandmother   . Diabetes Maternal Grandfather     History  Substance Use Topics  . Smoking status: Never Smoker   . Smokeless tobacco: Not on file  . Alcohol Use: No     No Known Allergies  Prescriptions prior to admission  Medication Sig Dispense Refill  . CALCIUM PO Take 1 tablet by mouth daily.      . Cholecalciferol (VITAMIN D PO) Take 1 tablet by mouth daily.      . IRON PO Take 1 tablet by mouth daily.      Marland Kitchen. OVER THE COUNTER MEDICATION Take 2 tablets by mouth 2 (two) times daily as needed (pain). Paracetamol 500 mg oversea brand of Tylenol.      . Prenatal Vit-Fe Fumarate-FA (PRENATAL MULTIVITAMIN) TABS tablet Take 1 tablet by mouth daily  at 12 noon.        Review of Systems - Negative except for what is mentioned in HPI.  Physical Exam  Blood pressure 117/63, pulse 105, temperature 98.6 F (37 C), temperature source Oral, resp. rate 18, height 5\' 6"  (1.676 m), weight 107.956 kg (238 lb), last menstrual period 11/17/2012, SpO2 98.00%. GENERAL: Well-developed, well-nourished female in no acute distress.  LUNGS: Clear to auscultation bilaterally.  HEART: Regular rate and rhythm. ABDOMEN: Soft, nontender, nondistended, gravid. Appropriate for GA. Tender on the left flank but no true CVA tenderness. Tender with palpation of the bladder and LLQ. No rebound or guarding.    EXTREMITIES: Nontender, no edema, 2+ distal pulses. Cervical Exam: Dilatation 0cm   Effacement thick%   Station -3  SSE: NEFG, normal vagina, cervix visually closed and long.  Physiologic discharge. Uterus appropriate for dates.    FHT:  Baseline rate 150 bpm   Variability moderate  Accelerations present   Decelerations none Contractions: quiet   Labs: Results for orders placed during the hospital encounter of 05/19/13 (from the past 24 hour(s))  URINALYSIS, ROUTINE W REFLEX MICROSCOPIC  Collection Time    05/19/13 11:10 AM      Result Value Ref Range   Color, Urine YELLOW  YELLOW   APPearance CLEAR  CLEAR   Specific Gravity, Urine 1.025  1.005 - 1.030   pH 6.0  5.0 - 8.0   Glucose, UA NEGATIVE  NEGATIVE mg/dL   Hgb urine dipstick NEGATIVE  NEGATIVE   Bilirubin Urine NEGATIVE  NEGATIVE   Ketones, ur NEGATIVE  NEGATIVE mg/dL   Protein, ur NEGATIVE  NEGATIVE mg/dL   Urobilinogen, UA 0.2  0.0 - 1.0 mg/dL   Nitrite NEGATIVE  NEGATIVE   Leukocytes, UA NEGATIVE  NEGATIVE    Imaging Studies:  No results found.  Assessment: Stephanie Davies is  38 y.o. G1P0 at 9356w6d presents with Flank Pain and Burning when voiding   - UA neg for infection. Hx and exam most consistent with likely nephrolithiasis. Exam very mild and reassuring.   Ddx included chronic  constipation vs. Gastrtits.   - renal US done and neg for hydronephrosis - no blood on UA  Plan: 1) flank pain - still likely nephrolithiasis as above.  - given hat and strainer for urine - vicodin for only severe pain - ok to cont taking tylenol  - discussed reasons to return- including severe worsening, changes in symptoms or other concerns  2) constipation - recommend daily colace  3) PNC - message sent to clinic for appt - recommend she call if no response in the next 3 business days.    4) FWB - cat I tracing.   Mckinlee Dunk L Darrill Vre[place] 5/9/20153:07 PM

## 2013-05-19 NOTE — MAU Note (Signed)
Pt states for past three days has had burning with voiding, and is having left sided flank pain.

## 2013-05-21 LAB — GC/CHLAMYDIA PROBE AMP
CT Probe RNA: NEGATIVE
GC Probe RNA: NEGATIVE

## 2013-05-27 ENCOUNTER — Encounter (HOSPITAL_COMMUNITY): Payer: Self-pay | Admitting: Family

## 2013-05-27 ENCOUNTER — Inpatient Hospital Stay (HOSPITAL_COMMUNITY)
Admission: AD | Admit: 2013-05-27 | Discharge: 2013-05-27 | Disposition: A | Discharge status: Home / Self Care | Payer: Medicaid Other | Source: Ambulatory Visit | Attending: Obstetrics and Gynecology | Admitting: Obstetrics and Gynecology

## 2013-05-27 DIAGNOSIS — O9989 Other specified diseases and conditions complicating pregnancy, childbirth and the puerperium: Principal | ICD-10-CM

## 2013-05-27 DIAGNOSIS — O99891 Other specified diseases and conditions complicating pregnancy: Secondary | ICD-10-CM

## 2013-05-27 DIAGNOSIS — O0930 Supervision of pregnancy with insufficient antenatal care, unspecified trimester: Secondary | ICD-10-CM | POA: Insufficient documentation

## 2013-05-27 DIAGNOSIS — N6459 Other signs and symptoms in breast: Secondary | ICD-10-CM

## 2013-05-27 NOTE — MAU Note (Signed)
38 yo, G1P0 at 5741w0d, presents to MAU with c/o bilateral breast itching, progressively worsening over the last 10 weeks. Denies medications, detergent changes. Has changed from [employer] to [employer] recently, problem persists.  No PNC to date. No VB, LOF, contractions. Reports +FM.

## 2013-05-27 NOTE — MAU Provider Note (Signed)
  History    G1 @ 27 wks no PNC in with itching breast. States has been going on for several weeks and she has been scratching them. Was med to stop them from itching. CSN: [id number]  Arrival date and time: 05/27/13 1138   None     Chief Complaint  Patient presents with  . Pruritis  . Breast Problem   HPI  OB History   Grav Para Term Preterm Abortions TAB SAB Ect Mult Living   1               Past Medical History  Diagnosis Date  . Alopecia   . Pseudotumor cerebri     Past Surgical History  Procedure Laterality Date  . Cholecystectomy      Family History  Problem Relation Age of Onset  . Esophageal cancer Maternal Grandmother   . Esophageal cancer Maternal Grandfather   . Diabetes Mother   . Diabetes Maternal Grandmother   . Diabetes Maternal Grandfather     History  Substance Use Topics  . Smoking status: Never Smoker   . Smokeless tobacco: Not on file  . Alcohol Use: No    Allergies: No Known Allergies  Prescriptions prior to admission  Medication Sig Dispense Refill  . HYDROcodone-acetaminophen (NORCO/VICODIN) 5-325 MG per tablet Take 1 tablet by mouth every 6 (six) hours as needed.  15 tablet  0    Review of Systems  Constitutional: Negative.   HENT: Negative.   Eyes: Negative.   Respiratory: Negative.   Cardiovascular: Negative.   Gastrointestinal: Negative.   Genitourinary: Negative.   Musculoskeletal: Negative.   Skin: Positive for itching.  Neurological: Negative.   Endo/Heme/Allergies: Negative.   Psychiatric/Behavioral: Negative.    Physical Exam   Blood pressure 133/57, pulse 96, temperature 98.7 F (37.1 C), temperature source Oral, resp. rate 16, last menstrual period 11/17/2012.  Physical Exam  Constitutional: She is oriented to person, place, and time. She appears well-developed and well-nourished.  HENT:  Head: Normocephalic.  Cardiovascular: Normal rate, regular rhythm, normal heart sounds and intact distal pulses.    Respiratory: Effort normal and breath sounds normal.  GI: Soft. Bowel sounds are normal.  Genitourinary: Vagina normal and uterus normal.  Musculoskeletal: Normal range of motion.  Neurological: She is alert and oriented to person, place, and time. She has normal reflexes.  Skin: Skin is warm and dry.  Areas of scratches from pt scratching self.  Psychiatric: She has a normal mood and affect. Her behavior is normal. Judgment and thought content normal.    MAU Course  Procedures  MDM Breast growth from pregnancy  Assessment and Plan  breast growth from pregnancy, OTC cortisone crm BID.  Ferdie PingMarie Darlene Derrick Orris 05/27/2013, 12:22 PM

## 2013-05-28 NOTE — MAU Provider Note (Signed)
Attestation of Attending Supervision of Advanced Practitioner (CNM/NP): Evaluation and management procedures were performed by the Advanced Practitioner under my supervision and collaboration.  I have reviewed the Advanced Practitioner's note and chart, and I agree with the management and plan.  Aliyanah Rozas 05/28/2013 12:22 AM   

## 2013-06-05 ENCOUNTER — Other Ambulatory Visit: Payer: Medicaid Other

## 2013-06-05 DIAGNOSIS — O0930 Supervision of pregnancy with insufficient antenatal care, unspecified trimester: Secondary | ICD-10-CM

## 2013-06-05 DIAGNOSIS — Z23 Encounter for immunization: Secondary | ICD-10-CM

## 2013-06-05 MED ORDER — TETANUS-DIPHTH-ACELL PERTUSSIS 5-2.5-18.5 LF-MCG/0.5 IM SUSP
0.5000 mL | Freq: Once | INTRAMUSCULAR | Status: DC
Start: 2013-06-05 — End: 2013-06-25

## 2013-06-06 ENCOUNTER — Encounter: Payer: Self-pay | Admitting: Obstetrics & Gynecology

## 2013-06-06 ENCOUNTER — Telehealth: Payer: Self-pay | Admitting: *Deleted

## 2013-06-06 DIAGNOSIS — O26899 Other specified pregnancy related conditions, unspecified trimester: Secondary | ICD-10-CM | POA: Insufficient documentation

## 2013-06-06 DIAGNOSIS — Z3490 Encounter for supervision of normal pregnancy, unspecified, unspecified trimester: Secondary | ICD-10-CM | POA: Insufficient documentation

## 2013-06-06 DIAGNOSIS — Z6791 Unspecified blood type, Rh negative: Secondary | ICD-10-CM | POA: Insufficient documentation

## 2013-06-06 LAB — OBSTETRIC PANEL
Antibody Screen: NEGATIVE
Basophils Absolute: 0 10*3/uL (ref 0.0–0.1)
Basophils Relative: 0 % (ref 0–1)
Eosinophils Absolute: 0.1 10*3/uL (ref 0.0–0.7)
Eosinophils Relative: 1 % (ref 0–5)
HCT: 34.2 % — ABNORMAL LOW (ref 36.0–46.0)
Hemoglobin: 11.6 g/dL — ABNORMAL LOW (ref 12.0–15.0)
Hepatitis B Surface Ag: NEGATIVE
Lymphocytes Relative: 13 % (ref 12–46)
Lymphs Abs: 1.4 10*3/uL (ref 0.7–4.0)
MCH: 27.7 pg (ref 26.0–34.0)
MCHC: 33.9 g/dL (ref 30.0–36.0)
MCV: 81.6 fL (ref 78.0–100.0)
Monocytes Absolute: 0.5 10*3/uL (ref 0.1–1.0)
Monocytes Relative: 5 % (ref 3–12)
Neutro Abs: 8.6 10*3/uL — ABNORMAL HIGH (ref 1.7–7.7)
Neutrophils Relative %: 81 % — ABNORMAL HIGH (ref 43–77)
Platelets: 348 10*3/uL (ref 150–400)
RBC: 4.19 MIL/uL (ref 3.87–5.11)
RDW: 15.3 % (ref 11.5–15.5)
Rh Type: NEGATIVE
Rubella: 4.78 Index — ABNORMAL HIGH (ref <0.90)
WBC: 10.6 10*3/uL — ABNORMAL HIGH (ref 4.0–10.5)

## 2013-06-06 LAB — HIV ANTIBODY (ROUTINE TESTING W REFLEX): HIV 1&2 Ab, 4th Generation: NONREACTIVE

## 2013-06-06 LAB — GLUCOSE TOLERANCE, 1 HOUR (50G) W/O FASTING: Glucose, 1 Hour GTT: 112 mg/dL (ref 70–140)

## 2013-06-06 NOTE — Telephone Encounter (Signed)
Patient called back in to the front office and I informed her of O - blood type and need for rhogam. Patient verbalized understanding. Told patient we would give her the rhogam at her visit on Monday. Patient verbalized understanding and had no further questions

## 2013-06-06 NOTE — Telephone Encounter (Signed)
Attempted to call patient to inform of O NEG blood type and need for Rhogam, patient's numbers listed area not active.  Called Emergency contact and left message that we are attempting to call patient and to please have her call the clinic.

## 2013-06-07 ENCOUNTER — Other Ambulatory Visit: Payer: Medicaid Other

## 2013-06-07 LAB — HEMOGLOBINOPATHY EVALUATION
Hemoglobin Other: 0 %
Hgb A2 Quant: 2.8 % (ref 2.2–3.2)
Hgb A: 97.2 % (ref 96.8–97.8)
Hgb F Quant: 0 % (ref 0.0–2.0)
Hgb S Quant: 0 %

## 2013-06-07 LAB — PRESCRIPTION MONITORING PROFILE (19 PANEL)
Amphetamine/Meth: NEGATIVE ng/mL
Barbiturate Screen, Urine: NEGATIVE ng/mL
Benzodiazepine Screen, Urine: NEGATIVE ng/mL
Buprenorphine, Urine: NEGATIVE ng/mL
Cannabinoid Scrn, Ur: NEGATIVE ng/mL
Carisoprodol, Urine: NEGATIVE ng/mL
Cocaine Metabolites: NEGATIVE ng/mL
Creatinine, Urine: 100.64 mg/dL (ref >20.0)
Fentanyl, Ur: NEGATIVE ng/mL
MDMA URINE: NEGATIVE ng/mL
Meperidine, Ur: NEGATIVE ng/mL
Methadone Screen, Urine: NEGATIVE ng/mL
Methaqualone: NEGATIVE ng/mL
Nitrites, Initial: NEGATIVE ug/mL
Opiate Screen, Urine: NEGATIVE ng/mL
Oxycodone Screen, Ur: NEGATIVE ng/mL
Phencyclidine, Ur: NEGATIVE ng/mL
Propoxyphene: NEGATIVE ng/mL
Tapentadol, urine: NEGATIVE ng/mL
Tramadol Scrn, Ur: NEGATIVE ng/mL
Zolpidem, Urine: NEGATIVE ng/mL
pH, Initial: 6.4 pH (ref 4.5–8.9)

## 2013-06-07 LAB — CULTURE, OB URINE: Colony Count: 65000

## 2013-06-11 ENCOUNTER — Encounter: Payer: Self-pay | Admitting: Family Medicine

## 2013-06-11 ENCOUNTER — Ambulatory Visit (INDEPENDENT_AMBULATORY_CARE_PROVIDER_SITE_OTHER): Payer: Medicaid Other | Admitting: Family Medicine

## 2013-06-11 VITALS — BP 119/67 | HR 103 | Temp 98.2°F | Wt 248.5 lb

## 2013-06-11 DIAGNOSIS — Z3490 Encounter for supervision of normal pregnancy, unspecified, unspecified trimester: Secondary | ICD-10-CM

## 2013-06-11 DIAGNOSIS — L299 Pruritus, unspecified: Secondary | ICD-10-CM

## 2013-06-11 DIAGNOSIS — Z3480 Encounter for supervision of other normal pregnancy, unspecified trimester: Secondary | ICD-10-CM

## 2013-06-11 DIAGNOSIS — K2990 Gastroduodenitis, unspecified, without bleeding: Secondary | ICD-10-CM

## 2013-06-11 DIAGNOSIS — O360990 Maternal care for other rhesus isoimmunization, unspecified trimester, not applicable or unspecified: Secondary | ICD-10-CM

## 2013-06-11 DIAGNOSIS — O26899 Other specified pregnancy related conditions, unspecified trimester: Secondary | ICD-10-CM

## 2013-06-11 DIAGNOSIS — Z6791 Unspecified blood type, Rh negative: Secondary | ICD-10-CM

## 2013-06-11 DIAGNOSIS — K2970 Gastritis, unspecified, without bleeding: Secondary | ICD-10-CM

## 2013-06-11 DIAGNOSIS — M549 Dorsalgia, unspecified: Secondary | ICD-10-CM

## 2013-06-11 LAB — POCT URINALYSIS DIP (DEVICE)
Bilirubin Urine: NEGATIVE
Glucose, UA: NEGATIVE mg/dL
Ketones, ur: NEGATIVE mg/dL
Leukocytes, UA: NEGATIVE
Nitrite: NEGATIVE
Protein, ur: NEGATIVE mg/dL
Specific Gravity, Urine: 1.015 (ref 1.005–1.030)
Urobilinogen, UA: 0.2 mg/dL (ref 0.0–1.0)
pH: 7 (ref 5.0–8.0)

## 2013-06-11 LAB — COMPREHENSIVE METABOLIC PANEL
ALT: 19 U/L (ref 0–35)
AST: 13 U/L (ref 0–37)
Albumin: 3.1 g/dL — ABNORMAL LOW (ref 3.5–5.2)
Alkaline Phosphatase: 101 U/L (ref 39–117)
BUN: 8 mg/dL (ref 6–23)
CO2: 19 mEq/L (ref 19–32)
Calcium: 9.2 mg/dL (ref 8.4–10.5)
Chloride: 106 mEq/L (ref 96–112)
Creat: 0.57 mg/dL (ref 0.50–1.10)
Glucose, Bld: 89 mg/dL (ref 70–99)
Potassium: 4.4 mEq/L (ref 3.5–5.3)
Sodium: 137 mEq/L (ref 135–145)
Total Bilirubin: 0.2 mg/dL (ref 0.2–1.2)
Total Protein: 6 g/dL (ref 6.0–8.3)

## 2013-06-11 MED ORDER — RHO D IMMUNE GLOBULIN 1500 UNIT/2ML IJ SOSY
300.0000 ug | PREFILLED_SYRINGE | Freq: Once | INTRAMUSCULAR | Status: AC
  Administered 2013-06-11: 300 ug via INTRAMUSCULAR

## 2013-06-11 MED ORDER — CYCLOBENZAPRINE HCL 10 MG PO TABS
10.0000 mg | ORAL_TABLET | Freq: Every day | ORAL | Status: DC
Start: 2013-06-11 — End: 2013-07-30

## 2013-06-11 MED ORDER — RANITIDINE HCL 150 MG PO TABS
150.0000 mg | ORAL_TABLET | Freq: Two times a day (BID) | ORAL | Status: DC
Start: 2013-06-11 — End: 2013-07-27

## 2013-06-11 MED ORDER — PRENATAL VITAMINS 0.8 MG PO TABS
1.0000 | ORAL_TABLET | Freq: Every day | ORAL | Status: DC
Start: 2013-06-11 — End: 2014-01-21

## 2013-06-11 NOTE — Progress Notes (Signed)
U/S scheduled 06/14/13 at 830 am.

## 2013-06-11 NOTE — Progress Notes (Signed)
  Subjective:    Stephanie Davies is a G1P0 [redacted]w[redacted]d by LMP c/w 11wk SONO being seen today for her first obstetrical visit.  Her obstetrical history is significant for late entry into [facility] here. Previously had care in [place] but left several months ago. Patient does intend to breast feed. Pregnancy history fully reviewed.  Patient reports having multiple aches and pains in back and hips. Worse with change in positions.  Also having nausea and epigastric pain- constant. Doesn't seem to get better with any of her home remedies. Having some swelling in her lower extremities. Occasionally has some itching particularly on her belly and her hands and feet.   Filed Vitals:   06/11/13 0931  BP: 119/67  Pulse: 103  Temp: 98.2 F (36.8 C)  Weight: 112.719 kg (248 lb 8 oz)    HISTORY: OB History  Gravida Para Term Preterm AB SAB TAB Ectopic Multiple Living  1             # Outcome Date GA Lbr Len/2nd Weight Sex Delivery Anes PTL Lv  1 CUR              Past Medical History  Diagnosis Date  . Alopecia   . Pseudotumor cerebri    Past Surgical History  Procedure Laterality Date  . Cholecystectomy     Family History  Problem Relation Age of Onset  . Esophageal cancer Maternal Grandmother   . Diabetes Maternal Grandmother   . Esophageal cancer Maternal Grandfather   . Diabetes Maternal Grandfather   . Diabetes Mother   . Hypertension Mother      Exam    Uterus:   appropriate for dates   Skin: normal coloration and turgor, no rashes    Neurologic: normal   Extremities: normal strength, tone, and muscle mass   HEENT PERRLA and extra ocular movement intact   Mouth/Teeth mucous membranes moist, pharynx normal without lesions   Neck supple   Cardiovascular: regular rate and rhythm   Respiratory:  appears well, vitals normal, no respiratory distress, acyanotic, normal RR, ear and throat exam is normal, neck free of mass or lymphadenopathy, chest clear, no wheezing, crepitations, rhonchi,  normal symmetric air entry   Abdomen: soft, non-tender; bowel sounds normal; no masses,  no organomegaly   Urinary: urethral meatus normal      Assessment:    Pregnancy: G1P0 Patient Active Problem List   Diagnosis Date Noted  . Rh negative status during pregnancy 06/06/2013  . Supervision of low-risk pregnancy 06/06/2013  . Need for diphtheria-tetanus-pertussis (Tdap) vaccine 06/05/2013        Plan:     1) pelvic pain - all related to pregnancy - discussed no "magic cure" - cont stretching and tylenol   2) epigastric pain - likely gastritis and LES dysfunction - rx of zantac  3) hx of "low fluid"  - per pt report on [place] in [place] - will obtain complete anatomy scan here   4) rh neg - rhogam today  Initial labs drawn. Prenatal vitamins. Problem list reviewed and updated.  Ultrasound discussed; fetal survey: ordered.  Follow up in 4 weeks. 50% of 30 min visit spent on counseling and coordination of care.    Lunna Vogelgesang L Montay Vanvoorhis 06/11/2013

## 2013-06-11 NOTE — Patient Instructions (Signed)
Third Trimester of Pregnancy  The third trimester is from week 29 through week 42, months 7 through 9. The third trimester is a time when the fetus is growing rapidly. At the end of the ninth month, the fetus is about 20 inches in length and weighs 6 10 pounds.   BODY CHANGES  Your body goes through many changes during pregnancy. The changes vary from woman to woman.    Your weight will continue to increase. You can expect to gain 25 35 pounds (11 16 kg) by the end of the pregnancy.   You may begin to get stretch marks on your hips, abdomen, and breasts.   You may urinate more often because the fetus is moving lower into your pelvis and pressing on your bladder.   You may develop or continue to have heartburn as a result of your pregnancy.   You may develop constipation because certain hormones are causing the muscles that push waste through your intestines to slow down.   You may develop hemorrhoids or swollen, bulging veins (varicose veins).   You may have pelvic pain because of the weight gain and pregnancy hormones relaxing your joints between the bones in your pelvis. Back aches may result from over exertion of the muscles supporting your posture.   Your breasts will continue to grow and be tender. A yellow discharge may leak from your breasts called colostrum.   Your belly button may stick out.   You may feel short of breath because of your expanding uterus.   You may notice the fetus "dropping," or moving lower in your abdomen.   You may have a bloody mucus discharge. This usually occurs a few days to a week before labor begins.   Your cervix becomes thin and soft (effaced) near your due date.  WHAT TO EXPECT AT YOUR PRENATAL EXAMS   You will have prenatal exams every 2 weeks until week 36. Then, you will have weekly prenatal exams. During a routine prenatal visit:   You will be weighed to make sure you and the fetus are growing normally.   Your blood pressure is taken.   Your abdomen will be  measured to track your baby's growth.   The fetal heartbeat will be listened to.   Any test results from the previous visit will be discussed.   You may have a cervical check near your due date to see if you have effaced.  At around 36 weeks, your caregiver will check your cervix. At the same time, your caregiver will also perform a test on the secretions of the vaginal tissue. This test is to determine if a type of bacteria, Group B streptococcus, is present. Your caregiver will explain this further.  Your caregiver may ask you:   What your birth plan is.   How you are feeling.   If you are feeling the baby move.   If you have had any abnormal symptoms, such as leaking fluid, bleeding, severe headaches, or abdominal cramping.   If you have any questions.  Other tests or screenings that may be performed during your third trimester include:   Blood tests that check for low iron levels (anemia).   Fetal testing to check the health, activity level, and growth of the fetus. Testing is done if you have certain medical conditions or if there are problems during the pregnancy.  FALSE LABOR  You may feel small, irregular contractions that eventually go away. These are called Braxton Hicks contractions, or   false labor. Contractions may last for hours, days, or even weeks before true labor sets in. If contractions come at regular intervals, intensify, or become painful, it is best to be seen by your caregiver.   SIGNS OF LABOR    Menstrual-like cramps.   Contractions that are 5 minutes apart or less.   Contractions that start on the top of the uterus and spread down to the lower abdomen and back.   A sense of increased pelvic pressure or back pain.   A watery or bloody mucus discharge that comes from the vagina.  If you have any of these signs before the 37th week of pregnancy, call your caregiver right away. You need to go to the hospital to get checked immediately.  HOME CARE INSTRUCTIONS    Avoid all  smoking, herbs, alcohol, and unprescribed drugs. These chemicals affect the formation and growth of the baby.   Follow your caregiver's instructions regarding medicine use. There are medicines that are either safe or unsafe to take during pregnancy.   Exercise only as directed by your caregiver. Experiencing uterine cramps is a good sign to stop exercising.   Continue to eat regular, healthy meals.   Wear a good support bra for breast tenderness.   Do not use hot tubs, steam rooms, or saunas.   Wear your seat belt at all times when driving.   Avoid raw meat, uncooked cheese, cat litter boxes, and soil used by cats. These carry germs that can cause birth defects in the baby.   Take your prenatal vitamins.   Try taking a stool softener (if your caregiver approves) if you develop constipation. Eat more high-fiber foods, such as fresh vegetables or fruit and whole grains. Drink plenty of fluids to keep your urine clear or pale yellow.   Take warm sitz baths to soothe any pain or discomfort caused by hemorrhoids. Use hemorrhoid cream if your caregiver approves.   If you develop varicose veins, wear support hose. Elevate your feet for 15 minutes, 3 4 times a day. Limit salt in your diet.   Avoid heavy lifting, wear low heal shoes, and practice good posture.   Rest a lot with your legs elevated if you have leg cramps or low back pain.   Visit your dentist if you have not gone during your pregnancy. Use a soft toothbrush to brush your teeth and be gentle when you floss.   A sexual relationship may be continued unless your caregiver directs you otherwise.   Do not travel far distances unless it is absolutely necessary and only with the approval of your caregiver.   Take prenatal classes to understand, practice, and ask questions about the labor and delivery.   Make a trial run to the hospital.   Pack your hospital bag.   Prepare the baby's nursery.   Continue to go to all your prenatal visits as directed  by your caregiver.  SEEK MEDICAL CARE IF:   You are unsure if you are in labor or if your water has broken.   You have dizziness.   You have mild pelvic cramps, pelvic pressure, or nagging pain in your abdominal area.   You have persistent nausea, vomiting, or diarrhea.   You have a bad smelling vaginal discharge.   You have pain with urination.  SEEK IMMEDIATE MEDICAL CARE IF:    You have a fever.   You are leaking fluid from your vagina.   You have spotting or bleeding from your vagina.     You have severe abdominal cramping or pain.   You have rapid weight loss or gain.   You have shortness of breath with chest pain.   You notice sudden or extreme swelling of your face, hands, ankles, feet, or legs.   You have not felt your baby move in over an hour.   You have severe headaches that do not go away with medicine.   You have vision changes.  Document Released: 12/22/2000 Document Revised: 08/30/2012 Document Reviewed: 02/29/2012  [employer] Patient Information 2014 [employer].

## 2013-06-11 NOTE — Progress Notes (Signed)
Edema in feet. C/o of intermittent lower pelvic pain/pressure.  Patient had all lab work done in MAU; needs rhogam today.  Discussed appropriate weight gain; patient verbalized understanding  New OB packets given.

## 2013-06-12 ENCOUNTER — Encounter: Payer: Self-pay | Admitting: Family Medicine

## 2013-06-12 DIAGNOSIS — L299 Pruritus, unspecified: Secondary | ICD-10-CM | POA: Insufficient documentation

## 2013-06-12 LAB — BILE ACIDS, TOTAL: Bile Acids Total: 7 umol/L (ref 0–19)

## 2013-06-14 ENCOUNTER — Ambulatory Visit (HOSPITAL_COMMUNITY)
Admission: RE | Admit: 2013-06-14 | Discharge: 2013-06-14 | Disposition: A | Discharge status: Home / Self Care | Payer: Medicaid Other | Source: Ambulatory Visit | Attending: Family Medicine | Admitting: Family Medicine

## 2013-06-14 ENCOUNTER — Other Ambulatory Visit: Payer: Self-pay | Admitting: Family Medicine

## 2013-06-14 ENCOUNTER — Encounter: Payer: Self-pay | Admitting: Family Medicine

## 2013-06-14 DIAGNOSIS — Z3689 Encounter for other specified antenatal screening: Secondary | ICD-10-CM | POA: Insufficient documentation

## 2013-06-14 DIAGNOSIS — Z3490 Encounter for supervision of normal pregnancy, unspecified, unspecified trimester: Secondary | ICD-10-CM

## 2013-06-22 ENCOUNTER — Encounter: Payer: Self-pay | Admitting: Family Medicine

## 2013-06-25 ENCOUNTER — Ambulatory Visit (INDEPENDENT_AMBULATORY_CARE_PROVIDER_SITE_OTHER): Payer: Medicaid Other | Admitting: Obstetrics & Gynecology

## 2013-06-25 VITALS — BP 115/68 | HR 104 | Temp 98.1°F | Wt 250.0 lb

## 2013-06-25 DIAGNOSIS — Z3490 Encounter for supervision of normal pregnancy, unspecified, unspecified trimester: Secondary | ICD-10-CM

## 2013-06-25 DIAGNOSIS — Z3480 Encounter for supervision of other normal pregnancy, unspecified trimester: Secondary | ICD-10-CM

## 2013-06-25 LAB — POCT URINALYSIS DIP (DEVICE)
Bilirubin Urine: NEGATIVE
Glucose, UA: NEGATIVE mg/dL
Ketones, ur: NEGATIVE mg/dL
Leukocytes, UA: NEGATIVE
Nitrite: NEGATIVE
Protein, ur: NEGATIVE mg/dL
Specific Gravity, Urine: 1.02 (ref 1.005–1.030)
Urobilinogen, UA: 0.2 mg/dL (ref 0.0–1.0)
pH: 6.5 (ref 5.0–8.0)

## 2013-06-25 NOTE — Patient Instructions (Signed)
Third Trimester of Pregnancy  The third trimester is from week 29 through week 42, months 7 through 9. The third trimester is a time when the fetus is growing rapidly. At the end of the ninth month, the fetus is about 20 inches in length and weighs 6 10 pounds.   BODY CHANGES  Your body goes through many changes during pregnancy. The changes vary from woman to woman.    Your weight will continue to increase. You can expect to gain 25 35 pounds (11 16 kg) by the end of the pregnancy.   You may begin to get stretch marks on your hips, abdomen, and breasts.   You may urinate more often because the fetus is moving lower into your pelvis and pressing on your bladder.   You may develop or continue to have heartburn as a result of your pregnancy.   You may develop constipation because certain hormones are causing the muscles that push waste through your intestines to slow down.   You may develop hemorrhoids or swollen, bulging veins (varicose veins).   You may have pelvic pain because of the weight gain and pregnancy hormones relaxing your joints between the bones in your pelvis. Back aches may result from over exertion of the muscles supporting your posture.   Your breasts will continue to grow and be tender. A yellow discharge may leak from your breasts called colostrum.   Your belly button may stick out.   You may feel short of breath because of your expanding uterus.   You may notice the fetus "dropping," or moving lower in your abdomen.   You may have a bloody mucus discharge. This usually occurs a few days to a week before labor begins.   Your cervix becomes thin and soft (effaced) near your due date.  WHAT TO EXPECT AT YOUR PRENATAL EXAMS   You will have prenatal exams every 2 weeks until week 36. Then, you will have weekly prenatal exams. During a routine prenatal visit:   You will be weighed to make sure you and the fetus are growing normally.   Your blood pressure is taken.   Your abdomen will be  measured to track your baby's growth.   The fetal heartbeat will be listened to.   Any test results from the previous visit will be discussed.   You may have a cervical check near your due date to see if you have effaced.  At around 36 weeks, your caregiver will check your cervix. At the same time, your caregiver will also perform a test on the secretions of the vaginal tissue. This test is to determine if a type of bacteria, Group B streptococcus, is present. Your caregiver will explain this further.  Your caregiver may ask you:   What your birth plan is.   How you are feeling.   If you are feeling the baby move.   If you have had any abnormal symptoms, such as leaking fluid, bleeding, severe headaches, or abdominal cramping.   If you have any questions.  Other tests or screenings that may be performed during your third trimester include:   Blood tests that check for low iron levels (anemia).   Fetal testing to check the health, activity level, and growth of the fetus. Testing is done if you have certain medical conditions or if there are problems during the pregnancy.  FALSE LABOR  You may feel small, irregular contractions that eventually go away. These are called Braxton Hicks contractions, or   false labor. Contractions may last for hours, days, or even weeks before true labor sets in. If contractions come at regular intervals, intensify, or become painful, it is best to be seen by your caregiver.   SIGNS OF LABOR    Menstrual-like cramps.   Contractions that are 5 minutes apart or less.   Contractions that start on the top of the uterus and spread down to the lower abdomen and back.   A sense of increased pelvic pressure or back pain.   A watery or bloody mucus discharge that comes from the vagina.  If you have any of these signs before the 37th week of pregnancy, call your caregiver right away. You need to go to the hospital to get checked immediately.  HOME CARE INSTRUCTIONS    Avoid all  smoking, herbs, alcohol, and unprescribed drugs. These chemicals affect the formation and growth of the baby.   Follow your caregiver's instructions regarding medicine use. There are medicines that are either safe or unsafe to take during pregnancy.   Exercise only as directed by your caregiver. Experiencing uterine cramps is a good sign to stop exercising.   Continue to eat regular, healthy meals.   Wear a good support bra for breast tenderness.   Do not use hot tubs, steam rooms, or saunas.   Wear your seat belt at all times when driving.   Avoid raw meat, uncooked cheese, cat litter boxes, and soil used by cats. These carry germs that can cause birth defects in the baby.   Take your prenatal vitamins.   Try taking a stool softener (if your caregiver approves) if you develop constipation. Eat more high-fiber foods, such as fresh vegetables or fruit and whole grains. Drink plenty of fluids to keep your urine clear or pale yellow.   Take warm sitz baths to soothe any pain or discomfort caused by hemorrhoids. Use hemorrhoid cream if your caregiver approves.   If you develop varicose veins, wear support hose. Elevate your feet for 15 minutes, 3 4 times a day. Limit salt in your diet.   Avoid heavy lifting, wear low heal shoes, and practice good posture.   Rest a lot with your legs elevated if you have leg cramps or low back pain.   Visit your dentist if you have not gone during your pregnancy. Use a soft toothbrush to brush your teeth and be gentle when you floss.   A sexual relationship may be continued unless your caregiver directs you otherwise.   Do not travel far distances unless it is absolutely necessary and only with the approval of your caregiver.   Take prenatal classes to understand, practice, and ask questions about the labor and delivery.   Make a trial run to the hospital.   Pack your hospital bag.   Prepare the baby's nursery.   Continue to go to all your prenatal visits as directed  by your caregiver.  SEEK MEDICAL CARE IF:   You are unsure if you are in labor or if your water has broken.   You have dizziness.   You have mild pelvic cramps, pelvic pressure, or nagging pain in your abdominal area.   You have persistent nausea, vomiting, or diarrhea.   You have a bad smelling vaginal discharge.   You have pain with urination.  SEEK IMMEDIATE MEDICAL CARE IF:    You have a fever.   You are leaking fluid from your vagina.   You have spotting or bleeding from your vagina.     You have severe abdominal cramping or pain.   You have rapid weight loss or gain.   You have shortness of breath with chest pain.   You notice sudden or extreme swelling of your face, hands, ankles, feet, or legs.   You have not felt your baby move in over an hour.   You have severe headaches that do not go away with medicine.   You have vision changes.  Document Released: 12/22/2000 Document Revised: 08/30/2012 Document Reviewed: 02/29/2012  [employer] Patient Information 2014 [employer].

## 2013-06-25 NOTE — Progress Notes (Signed)
Pelvic pressure and pain in upper abdomen sharp, constant at night, no contractions. Good fetal movement. Normal glucose testing

## 2013-06-25 NOTE — Progress Notes (Signed)
Patient reports pelvic pressure/pain

## 2013-06-27 ENCOUNTER — Encounter: Payer: Self-pay | Admitting: *Deleted

## 2013-07-10 ENCOUNTER — Telehealth: Payer: Self-pay | Admitting: *Deleted

## 2013-07-10 NOTE — Telephone Encounter (Signed)
Pt left message stating that she would like a referral to a dentist because she has infection of her gums on both sides of her mouth. This is causing pain and she cannot eat.

## 2013-07-16 ENCOUNTER — Encounter: Payer: Self-pay | Admitting: Obstetrics & Gynecology

## 2013-07-17 NOTE — Telephone Encounter (Signed)
[facility]. Called patient and informed her she should be called with an appointment. Gave her [facility] number to call if she wishes. Patient verbalized understanding. No further questions or concerns.

## 2013-07-18 ENCOUNTER — Encounter: Payer: Medicaid Other | Admitting: Advanced Practice Midwife

## 2013-07-18 ENCOUNTER — Telehealth: Payer: Self-pay | Admitting: *Deleted

## 2013-07-18 NOTE — Telephone Encounter (Signed)
Pt left message stating that she ahs been to the dentist and received a prescription for Pen VK. Even though the letter to the dentist stated that she could be treated as if not pregnant, she wants to know if this medication is safe during pregnancy. I called pt this morning and left message on her personal voice mail that she may take the Pen VK and that it is safe.

## 2013-07-20 ENCOUNTER — Ambulatory Visit (INDEPENDENT_AMBULATORY_CARE_PROVIDER_SITE_OTHER): Payer: Medicaid Other | Admitting: Obstetrics and Gynecology

## 2013-07-20 ENCOUNTER — Encounter: Payer: Self-pay | Admitting: Obstetrics and Gynecology

## 2013-07-20 VITALS — BP 122/72 | HR 102 | Temp 97.1°F | Wt 258.0 lb

## 2013-07-20 DIAGNOSIS — O9989 Other specified diseases and conditions complicating pregnancy, childbirth and the puerperium: Secondary | ICD-10-CM

## 2013-07-20 DIAGNOSIS — O26899 Other specified pregnancy related conditions, unspecified trimester: Secondary | ICD-10-CM | POA: Insufficient documentation

## 2013-07-20 DIAGNOSIS — R102 Pelvic and perineal pain: Principal | ICD-10-CM

## 2013-07-20 DIAGNOSIS — N949 Unspecified condition associated with female genital organs and menstrual cycle: Secondary | ICD-10-CM

## 2013-07-20 LAB — POCT URINALYSIS DIP (DEVICE)
Bilirubin Urine: NEGATIVE
Glucose, UA: NEGATIVE mg/dL
Hgb urine dipstick: NEGATIVE
Ketones, ur: NEGATIVE mg/dL
Leukocytes, UA: NEGATIVE
Nitrite: NEGATIVE
Protein, ur: NEGATIVE mg/dL
Specific Gravity, Urine: 1.02 (ref 1.005–1.030)
Urobilinogen, UA: 0.2 mg/dL (ref 0.0–1.0)
pH: 6.5 (ref 5.0–8.0)

## 2013-07-20 NOTE — Progress Notes (Signed)
Edema in feet/ankles.  Reports hip pain.

## 2013-07-20 NOTE — Progress Notes (Signed)
RF of AMA and mild obesity. Lots of questions about L&D, RLP, hemorrhoidal pain (no bleeding or constipation). Flexeril did not help RLP. Advised abd tightening and pelvic support girdle, increase Tylenol dose.

## 2013-07-20 NOTE — Patient Instructions (Addendum)
Third Trimester of Pregnancy The third trimester is from week 29 through week 42, months 7 through 9. The third trimester is a time when the fetus is growing rapidly. At the end of the ninth month, the fetus is about 20 inches in length and weighs 6-10 pounds.  BODY CHANGES Your body goes through many changes during pregnancy. The changes vary from woman to woman.   Your weight will continue to increase. You can expect to gain 25-35 pounds (11-16 kg) by the end of the pregnancy.  You may begin to get stretch marks on your hips, abdomen, and breasts.  You may urinate more often because the fetus is moving lower into your pelvis and pressing on your bladder.  You may develop or continue to have heartburn as a result of your pregnancy.  You may develop constipation because certain hormones are causing the muscles that push waste through your intestines to slow down.  You may develop hemorrhoids or swollen, bulging veins (varicose veins).  You may have pelvic pain because of the weight gain and pregnancy hormones relaxing your joints between the bones in your pelvis. Backaches may result from overexertion of the muscles supporting your posture.  You may have changes in your hair. These can include thickening of your hair, rapid growth, and changes in texture. Some women also have hair loss during or after pregnancy, or hair that feels dry or thin. Your hair will most likely return to normal after your baby is born.  Your breasts will continue to grow and be tender. A yellow discharge may leak from your breasts called colostrum.  Your belly button may stick out.  You may feel short of breath because of your expanding uterus.  You may notice the fetus "dropping," or moving lower in your abdomen.  You may have a bloody mucus discharge. This usually occurs a few days to a week before labor begins.  Your cervix becomes thin and soft (effaced) near your due date. WHAT TO EXPECT AT YOUR PRENATAL  EXAMS  You will have prenatal exams every 2 weeks until week 36. Then, you will have weekly prenatal exams. During a routine prenatal visit:  You will be weighed to make sure you and the fetus are growing normally.  Your blood pressure is taken.  Your abdomen will be measured to track your baby's growth.  The fetal heartbeat will be listened to.  Any test results from the previous visit will be discussed.  You may have a cervical check near your due date to see if you have effaced. At around 36 weeks, your caregiver will check your cervix. At the same time, your caregiver will also perform a test on the secretions of the vaginal tissue. This test is to determine if a type of bacteria, Group B streptococcus, is present. Your caregiver will explain this further. Your caregiver may ask you:  What your birth plan is.  How you are feeling.  If you are feeling the baby move.  If you have had any abnormal symptoms, such as leaking fluid, bleeding, severe headaches, or abdominal cramping.  If you have any questions. Other tests or screenings that may be performed during your third trimester include:  Blood tests that check for low iron levels (anemia).  Fetal testing to check the health, activity level, and growth of the fetus. Testing is done if you have certain medical conditions or if there are problems during the pregnancy. FALSE LABOR You may feel small, irregular contractions that   eventually go away. These are called Braxton Hicks contractions, or false labor. Contractions may last for hours, days, or even weeks before true labor sets in. If contractions come at regular intervals, intensify, or become painful, it is best to be seen by your caregiver.  SIGNS OF LABOR   Menstrual-like cramps.  Contractions that are 5 minutes apart or less.  Contractions that start on the top of the uterus and spread down to the lower abdomen and back.  A sense of increased pelvic pressure or back  pain.  A watery or bloody mucus discharge that comes from the vagina. If you have any of these signs before the 37th week of pregnancy, call your caregiver right away. You need to go to the hospital to get checked immediately. HOME CARE INSTRUCTIONS   Avoid all smoking, herbs, alcohol, and unprescribed drugs. These chemicals affect the formation and growth of the baby.  Follow your caregiver's instructions regarding medicine use. There are medicines that are either safe or unsafe to take during pregnancy.  Exercise only as directed by your caregiver. Experiencing uterine cramps is a good sign to stop exercising.  Continue to eat regular, healthy meals.  Wear a good support bra for breast tenderness.  Do not use hot tubs, steam rooms, or saunas.  Wear your seat belt at all times when driving.  Avoid raw meat, uncooked cheese, cat litter boxes, and soil used by cats. These carry germs that can cause birth defects in the baby.  Take your prenatal vitamins.  Try taking a stool softener (if your caregiver approves) if you develop constipation. Eat more high-fiber foods, such as fresh vegetables or fruit and whole grains. Drink plenty of fluids to keep your urine clear or pale yellow.  Take warm sitz baths to soothe any pain or discomfort caused by hemorrhoids. Use hemorrhoid cream if your caregiver approves.  If you develop varicose veins, wear support hose. Elevate your feet for 15 minutes, 3-4 times a day. Limit salt in your diet.  Avoid heavy lifting, wear low heal shoes, and practice good posture.  Rest a lot with your legs elevated if you have leg cramps or low back pain.  Visit your dentist if you have not gone during your pregnancy. Use a soft toothbrush to brush your teeth and be gentle when you floss.  A sexual relationship may be continued unless your caregiver directs you otherwise.  Do not travel far distances unless it is absolutely necessary and only with the approval  of your caregiver.  Take prenatal classes to understand, practice, and ask questions about the labor and delivery.  Make a trial run to the hospital.  Pack your hospital bag.  Prepare the baby's nursery.  Continue to go to all your prenatal visits as directed by your caregiver. SEEK MEDICAL CARE IF:  You are unsure if you are in labor or if your water has broken.  You have dizziness.  You have mild pelvic cramps, pelvic pressure, or nagging pain in your abdominal area.  You have persistent nausea, vomiting, or diarrhea.  You have a bad smelling vaginal discharge.  You have pain with urination. SEEK IMMEDIATE MEDICAL CARE IF:   You have a fever.  You are leaking fluid from your vagina.  You have spotting or bleeding from your vagina.  You have severe abdominal cramping or pain.  You have rapid weight loss or gain.  You have shortness of breath with chest pain.  You notice sudden or extreme swelling   of your face, hands, ankles, feet, or legs.  You have not felt your baby move in over an hour.  You have severe headaches that do not go away with medicine.  You have vision changes. Document Released: 12/22/2000 Document Revised: 01/02/2013 Document Reviewed: 02/29/2012 [employer] Patient Information 2015 [employer]. This information is not intended to replace advice given to you by your health care provider. Make sure you discuss any questions you have with your health care provider. Hemorrhoids Hemorrhoids are swollen veins around the rectum or anus. There are two types of hemorrhoids:   Internal hemorrhoids. These occur in the veins just inside the rectum. They may poke through to the outside and become irritated and painful.  External hemorrhoids. These occur in the veins outside the anus and can be felt as a painful swelling or hard lump near the anus. CAUSES  Pregnancy.   Obesity.   Constipation or diarrhea.   Straining to have a bowel movement.    Sitting for long periods on the toilet.  Heavy lifting or other activity that caused you to strain.  Anal intercourse. SYMPTOMS   Pain.   Anal itching or irritation.   Rectal bleeding.   Fecal leakage.   Anal swelling.   One or more lumps around the anus.  DIAGNOSIS  Your caregiver may be able to diagnose hemorrhoids by visual examination. Other examinations or tests that may be performed include:   Examination of the rectal area with a gloved hand (digital rectal exam).   Examination of anal canal using a small tube (scope).   A blood test if you have lost a significant amount of blood.  A test to look inside the colon (sigmoidoscopy or colonoscopy). TREATMENT Most hemorrhoids can be treated at home. However, if symptoms do not seem to be getting better or if you have a lot of rectal bleeding, your caregiver may perform a procedure to help make the hemorrhoids get smaller or remove them completely. Possible treatments include:   Placing a rubber band at the base of the hemorrhoid to cut off the circulation (rubber band ligation).   Injecting a chemical to shrink the hemorrhoid (sclerotherapy).   Using a tool to burn the hemorrhoid (infrared light therapy).   Surgically removing the hemorrhoid (hemorrhoidectomy).   Stapling the hemorrhoid to block blood flow to the tissue (hemorrhoid stapling).  HOME CARE INSTRUCTIONS   Eat foods with fiber, such as whole grains, beans, nuts, fruits, and vegetables. Ask your doctor about taking products with added fiber in them (fibersupplements).  Increase fluid intake. Drink enough water and fluids to keep your urine clear or pale yellow.   Exercise regularly.   Go to the bathroom when you have the urge to have a bowel movement. Do not wait.   Avoid straining to have bowel movements.   Keep the anal area dry and clean. Use wet toilet paper or moist towelettes after a bowel movement.   Medicated creams  and suppositories may be used or applied as directed.   Only take over-the-counter or prescription medicines as directed by your caregiver.   Take warm sitz baths for 15-20 minutes, 3-4 times a day to ease pain and discomfort.   Place ice packs on the hemorrhoids if they are tender and swollen. Using ice packs between sitz baths may be helpful.   Put ice in a plastic bag.   Place a towel between your skin and the bag.   Leave the ice on for 15-20 minutes, 3-4   times a day.   Do not use a donut-shaped pillow or sit on the toilet for long periods. This increases blood pooling and pain.  SEEK MEDICAL CARE IF:  You have increasing pain and swelling that is not controlled by treatment or medicine.  You have uncontrolled bleeding.  You have difficulty or you are unable to have a bowel movement.  You have pain or inflammation outside the area of the hemorrhoids. MAKE SURE YOU:  Understand these instructions.  Will watch your condition.  Will get help right away if you are not doing well or get worse. Document Released: 12/26/1999 Document Revised: 12/15/2011 Document Reviewed: 11/02/2011 [employer] Patient Information 2015 [employer]. This information is not intended to replace advice given to you by your health care provider. Make sure you discuss any questions you have with your health care provider.  

## 2013-07-27 ENCOUNTER — Encounter: Payer: Self-pay | Admitting: Family Medicine

## 2013-07-27 ENCOUNTER — Ambulatory Visit (INDEPENDENT_AMBULATORY_CARE_PROVIDER_SITE_OTHER): Payer: Medicaid Other | Admitting: Family Medicine

## 2013-07-27 VITALS — BP 113/72 | HR 100 | Temp 96.9°F | Wt 260.3 lb

## 2013-07-27 DIAGNOSIS — L299 Pruritus, unspecified: Secondary | ICD-10-CM

## 2013-07-27 DIAGNOSIS — Z3480 Encounter for supervision of other normal pregnancy, unspecified trimester: Secondary | ICD-10-CM

## 2013-07-27 DIAGNOSIS — Z3493 Encounter for supervision of normal pregnancy, unspecified, third trimester: Secondary | ICD-10-CM

## 2013-07-27 LAB — POCT URINALYSIS DIP (DEVICE)
Bilirubin Urine: NEGATIVE
Glucose, UA: NEGATIVE mg/dL
Ketones, ur: NEGATIVE mg/dL
Leukocytes, UA: NEGATIVE
Nitrite: NEGATIVE
Protein, ur: NEGATIVE mg/dL
Specific Gravity, Urine: 1.02 (ref 1.005–1.030)
Urobilinogen, UA: 0.2 mg/dL (ref 0.0–1.0)
pH: 6.5 (ref 5.0–8.0)

## 2013-07-27 LAB — OB RESULTS CONSOLE GC/CHLAMYDIA
Chlamydia: NEGATIVE
Gonorrhea: NEGATIVE

## 2013-07-27 NOTE — Progress Notes (Signed)
Patient reports a lot of pelvic pressure and some pain

## 2013-07-27 NOTE — Progress Notes (Signed)
S: 38 yo G1 @ [id number] here for ROBV - having lots of pelvic pressure.  - no ctx, lof, vb. +FM - also has a dental abscess that needs a workup and needs a letter for the dentist  O: See flowsheet  A/P - 36 week cultures obtained - reassurance about pain and swelling - letter from front desk for dentistry - f/u in 1 week

## 2013-07-27 NOTE — Patient Instructions (Signed)
Third Trimester of Pregnancy The third trimester is from week 29 through week 42, months 7 through 9. The third trimester is a time when the fetus is growing rapidly. At the end of the ninth month, the fetus is about 20 inches in length and weighs 6-10 pounds.  BODY CHANGES Your body goes through many changes during pregnancy. The changes vary from woman to woman.   Your weight will continue to increase. You can expect to gain 25-35 pounds (11-16 kg) by the end of the pregnancy.  You may begin to get stretch marks on your hips, abdomen, and breasts.  You may urinate more often because the fetus is moving lower into your pelvis and pressing on your bladder.  You may develop or continue to have heartburn as a result of your pregnancy.  You may develop constipation because certain hormones are causing the muscles that push waste through your intestines to slow down.  You may develop hemorrhoids or swollen, bulging veins (varicose veins).  You may have pelvic pain because of the weight gain and pregnancy hormones relaxing your joints between the bones in your pelvis. Backaches may result from overexertion of the muscles supporting your posture.  You may have changes in your hair. These can include thickening of your hair, rapid growth, and changes in texture. Some women also have hair loss during or after pregnancy, or hair that feels dry or thin. Your hair will most likely return to normal after your baby is born.  Your breasts will continue to grow and be tender. A yellow discharge may leak from your breasts called colostrum.  Your belly button may stick out.  You may feel short of breath because of your expanding uterus.  You may notice the fetus "dropping," or moving lower in your abdomen.  You may have a bloody mucus discharge. This usually occurs a few days to a week before labor begins.  Your cervix becomes thin and soft (effaced) near your due date. WHAT TO EXPECT AT YOUR PRENATAL  EXAMS  You will have prenatal exams every 2 weeks until week 36. Then, you will have weekly prenatal exams. During a routine prenatal visit:  You will be weighed to make sure you and the fetus are growing normally.  Your blood pressure is taken.  Your abdomen will be measured to track your baby's growth.  The fetal heartbeat will be listened to.  Any test results from the previous visit will be discussed.  You may have a cervical check near your due date to see if you have effaced. At around 36 weeks, your caregiver will check your cervix. At the same time, your caregiver will also perform a test on the secretions of the vaginal tissue. This test is to determine if a type of bacteria, Group B streptococcus, is present. Your caregiver will explain this further. Your caregiver may ask you:  What your birth plan is.  How you are feeling.  If you are feeling the baby move.  If you have had any abnormal symptoms, such as leaking fluid, bleeding, severe headaches, or abdominal cramping.  If you have any questions. Other tests or screenings that may be performed during your third trimester include:  Blood tests that check for low iron levels (anemia).  Fetal testing to check the health, activity level, and growth of the fetus. Testing is done if you have certain medical conditions or if there are problems during the pregnancy. FALSE LABOR You may feel small, irregular contractions that   eventually go away. These are called Braxton Hicks contractions, or false labor. Contractions may last for hours, days, or even weeks before true labor sets in. If contractions come at regular intervals, intensify, or become painful, it is best to be seen by your caregiver.  SIGNS OF LABOR   Menstrual-like cramps.  Contractions that are 5 minutes apart or less.  Contractions that start on the top of the uterus and spread down to the lower abdomen and back.  A sense of increased pelvic pressure or back  pain.  A watery or bloody mucus discharge that comes from the vagina. If you have any of these signs before the 37th week of pregnancy, call your caregiver right away. You need to go to the hospital to get checked immediately. HOME CARE INSTRUCTIONS   Avoid all smoking, herbs, alcohol, and unprescribed drugs. These chemicals affect the formation and growth of the baby.  Follow your caregiver's instructions regarding medicine use. There are medicines that are either safe or unsafe to take during pregnancy.  Exercise only as directed by your caregiver. Experiencing uterine cramps is a good sign to stop exercising.  Continue to eat regular, healthy meals.  Wear a good support bra for breast tenderness.  Do not use hot tubs, steam rooms, or saunas.  Wear your seat belt at all times when driving.  Avoid raw meat, uncooked cheese, cat litter boxes, and soil used by cats. These carry germs that can cause birth defects in the baby.  Take your prenatal vitamins.  Try taking a stool softener (if your caregiver approves) if you develop constipation. Eat more high-fiber foods, such as fresh vegetables or fruit and whole grains. Drink plenty of fluids to keep your urine clear or pale yellow.  Take warm sitz baths to soothe any pain or discomfort caused by hemorrhoids. Use hemorrhoid cream if your caregiver approves.  If you develop varicose veins, wear support hose. Elevate your feet for 15 minutes, 3-4 times a day. Limit salt in your diet.  Avoid heavy lifting, wear low heal shoes, and practice good posture.  Rest a lot with your legs elevated if you have leg cramps or low back pain.  Visit your dentist if you have not gone during your pregnancy. Use a soft toothbrush to brush your teeth and be gentle when you floss.  A sexual relationship may be continued unless your caregiver directs you otherwise.  Do not travel far distances unless it is absolutely necessary and only with the approval  of your caregiver.  Take prenatal classes to understand, practice, and ask questions about the labor and delivery.  Make a trial run to the hospital.  Pack your hospital bag.  Prepare the baby's nursery.  Continue to go to all your prenatal visits as directed by your caregiver. SEEK MEDICAL CARE IF:  You are unsure if you are in labor or if your water has broken.  You have dizziness.  You have mild pelvic cramps, pelvic pressure, or nagging pain in your abdominal area.  You have persistent nausea, vomiting, or diarrhea.  You have a bad smelling vaginal discharge.  You have pain with urination. SEEK IMMEDIATE MEDICAL CARE IF:   You have a fever.  You are leaking fluid from your vagina.  You have spotting or bleeding from your vagina.  You have severe abdominal cramping or pain.  You have rapid weight loss or gain.  You have shortness of breath with chest pain.  You notice sudden or extreme swelling   of your face, hands, ankles, feet, or legs.  You have not felt your baby move in over an hour.  You have severe headaches that do not go away with medicine.  You have vision changes. Document Released: 12/22/2000 Document Revised: 01/02/2013 Document Reviewed: 02/29/2012 [employer] Patient Information 2015 [employer]. This information is not intended to replace advice given to you by your health care provider. Make sure you discuss any questions you have with your health care provider.  

## 2013-07-28 ENCOUNTER — Encounter (HOSPITAL_COMMUNITY): Payer: Self-pay

## 2013-07-28 ENCOUNTER — Inpatient Hospital Stay (HOSPITAL_COMMUNITY)
Admission: AD | Admit: 2013-07-28 | Discharge: 2013-07-28 | Disposition: A | Discharge status: Home / Self Care | Payer: Medicaid Other | Source: Ambulatory Visit | Attending: Obstetrics & Gynecology | Admitting: Obstetrics & Gynecology

## 2013-07-28 DIAGNOSIS — K029 Dental caries, unspecified: Secondary | ICD-10-CM

## 2013-07-28 DIAGNOSIS — K047 Periapical abscess without sinus: Secondary | ICD-10-CM

## 2013-07-28 LAB — GC/CHLAMYDIA PROBE AMP
CT Probe RNA: NEGATIVE
GC Probe RNA: NEGATIVE

## 2013-07-28 MED ORDER — HYDROCODONE-ACETAMINOPHEN 5-325 MG PO TABS: 1.0000 | ORAL_TABLET | Freq: Once | ORAL | Status: DC

## 2013-07-28 MED ORDER — CLINDAMYCIN HCL 300 MG PO CAPS: 300.0000 mg | ORAL_CAPSULE | Freq: Three times a day (TID) | ORAL | Status: DC

## 2013-07-28 MED ORDER — HYDROCODONE-ACETAMINOPHEN 5-325 MG PO TABS
1.0000 | ORAL_TABLET | Freq: Once | ORAL | Status: DC
  Filled 2013-07-28: qty 1

## 2013-07-28 NOTE — Discharge Instructions (Signed)

## 2013-07-28 NOTE — MAU Note (Addendum)
Saw dentist 1 week ago. Scheduled to have tooth extraction Monday. Started having pain on opposite side on mouth yesterday, R lower. States she finished course of PCN prescribed by dentist for infection in the tooth that is to be pulled. Took Tylenol at 0800, helps a little bit. Had clinic appointment yesterday and was told everything is OK with pregnancy and baby. Pain in mouth started after that.

## 2013-07-28 NOTE — MAU Provider Note (Signed)
Attestation of Attending Supervision of Fellow: Evaluation and management procedures were performed by the Fellow under my supervision and collaboration.  I have reviewed the Fellow's note and chart, and I agree with the management and plan.    

## 2013-07-28 NOTE — MAU Provider Note (Signed)
°  History     CSN: [id number]  Arrival date and time: 07/28/13 [id number]   None     Chief Complaint  Patient presents with   Dental Pain   HPI  Seen yesterday in clinic, dental abscess noted.  Seen by dentist 2 weeks ago, finished penicillin yesterday, has appointment with dentist on Monday.  Pain initially was on right side, but now also on left side starting yesterday.  Has not noticed any facial swelling, feels gum is swollen.  Started taking tylenol q6h x 1 day, no improvement in pain.  Anbesol OTC has helped somewhat.  +FM, no LOF, no VB, no allergies.   Past Medical History  Diagnosis Date   Alopecia    Pseudotumor cerebri     Past Surgical History  Procedure Laterality Date   Cholecystectomy      Family History  Problem Relation Age of Onset   Esophageal cancer Maternal Grandmother    Diabetes Maternal Grandmother    Esophageal cancer Maternal Grandfather    Diabetes Maternal Grandfather    Diabetes Mother    Hypertension Mother     History  Substance Use Topics   Smoking status: Never Smoker    Smokeless tobacco: Never Used   Alcohol Use: No    Allergies: No Known Allergies  Prescriptions prior to admission  Medication Sig Dispense Refill   acetaminophen (TYLENOL) 325 MG tablet Take 650 mg by mouth every 6 (six) hours as needed for mild pain or moderate pain.       cyclobenzaprine (FLEXERIL) 10 MG tablet Take 1 tablet (10 mg total) by mouth at bedtime.  30 tablet  1   penicillin v potassium (VEETID) 500 MG tablet Take 500 mg by mouth 4 (four) times daily. Pt started on 07/20/13 for 7 days. Last dose was 07/27/13.       Prenatal Multivit-Min-Fe-FA (PRENATAL VITAMINS) 0.8 MG tablet Take 1 tablet by mouth daily.  30 tablet  12    Review of Systems  Constitutional: Negative for fever, chills and weight loss.  HENT: Negative for ear pain.        Gum pain  Respiratory: Negative for cough.   Cardiovascular: Negative for leg swelling.    Gastrointestinal: Negative for nausea and vomiting.   Physical Exam   Blood pressure 125/71, pulse 98, temperature 99 F (37.2 C), temperature source Oral, resp. rate 18, height 5\' 6"  (1.676 m), weight 117.935 kg (260 lb), last menstrual period 11/17/2012, SpO2 100.00%.  Physical Exam  Constitutional: She appears well-developed and well-nourished. No distress.  HENT:  Nose: Nose normal.  Mouth/Throat:    Cardiovascular: Normal rate and regular rhythm.   No murmur heard. Respiratory: Effort normal and breath sounds normal. No respiratory distress.  GI: Soft.  Gravid  Skin: Skin is warm and dry.    MAU Course  Procedures  MDM Hydrocodone 5/325mg  x 1 for pain  Assessment and Plan  Labor warnings discussed, advised to follow up dentist, has appointment on Monday and vows to do so.  - change abx to clindamycin as new symptoms started on pen VK, however new symptoms are likely also in part due to exposed root. - Rx for hydrocodone 5/325mg  1-2 po q 4-6h pain, rx to be written by Dr. Gillis SantaBeck   Kristy Acosta 07/28/2013, 10:15 AM

## 2013-07-29 LAB — CULTURE, BETA STREP (GROUP B ONLY)

## 2013-07-29 LAB — OB RESULTS CONSOLE GBS: GBS: NEGATIVE

## 2013-07-30 ENCOUNTER — Inpatient Hospital Stay (HOSPITAL_COMMUNITY)
Admission: AD | Admit: 2013-07-30 | Discharge: 2013-08-01 | DRG: 781 | Disposition: A | Discharge status: Home / Self Care | Payer: Medicaid Other | Source: Ambulatory Visit | Attending: Obstetrics & Gynecology | Admitting: Obstetrics & Gynecology

## 2013-07-30 ENCOUNTER — Encounter (HOSPITAL_COMMUNITY): Payer: Self-pay | Admitting: *Deleted

## 2013-07-30 DIAGNOSIS — O9989 Other specified diseases and conditions complicating pregnancy, childbirth and the puerperium: Secondary | ICD-10-CM

## 2013-07-30 DIAGNOSIS — O360990 Maternal care for other rhesus isoimmunization, unspecified trimester, not applicable or unspecified: Secondary | ICD-10-CM | POA: Diagnosis present

## 2013-07-30 DIAGNOSIS — Z8249 Family history of ischemic heart disease and other diseases of the circulatory system: Secondary | ICD-10-CM

## 2013-07-30 DIAGNOSIS — Z800 Family history of malignant neoplasm of digestive organs: Secondary | ICD-10-CM | POA: Diagnosis not present

## 2013-07-30 DIAGNOSIS — L299 Pruritus, unspecified: Secondary | ICD-10-CM

## 2013-07-30 DIAGNOSIS — Z87442 Personal history of urinary calculi: Secondary | ICD-10-CM

## 2013-07-30 DIAGNOSIS — K047 Periapical abscess without sinus: Secondary | ICD-10-CM | POA: Diagnosis not present

## 2013-07-30 DIAGNOSIS — Z833 Family history of diabetes mellitus: Secondary | ICD-10-CM

## 2013-07-30 DIAGNOSIS — N949 Unspecified condition associated with female genital organs and menstrual cycle: Secondary | ICD-10-CM | POA: Diagnosis present

## 2013-07-30 DIAGNOSIS — O99891 Other specified diseases and conditions complicating pregnancy: Principal | ICD-10-CM | POA: Diagnosis present

## 2013-07-30 HISTORY — DX: Headache: R51

## 2013-07-30 HISTORY — DX: Calculus of kidney: N20.0

## 2013-07-30 LAB — CBC WITH DIFFERENTIAL/PLATELET
Basophils Absolute: 0 10*3/uL (ref 0.0–0.1)
Basophils Relative: 0 % (ref 0–1)
Eosinophils Absolute: 0.2 10*3/uL (ref 0.0–0.7)
Eosinophils Relative: 1 % (ref 0–5)
HCT: 34.4 % — ABNORMAL LOW (ref 36.0–46.0)
Hemoglobin: 11.5 g/dL — ABNORMAL LOW (ref 12.0–15.0)
Lymphocytes Relative: 8 % — ABNORMAL LOW (ref 12–46)
Lymphs Abs: 1 10*3/uL (ref 0.7–4.0)
MCH: 27.5 pg (ref 26.0–34.0)
MCHC: 33.4 g/dL (ref 30.0–36.0)
MCV: 82.3 fL (ref 78.0–100.0)
Monocytes Absolute: 1 10*3/uL (ref 0.1–1.0)
Monocytes Relative: 7 % (ref 3–12)
Neutro Abs: 11 10*3/uL — ABNORMAL HIGH (ref 1.7–7.7)
Neutrophils Relative %: 84 % — ABNORMAL HIGH (ref 43–77)
Platelets: 391 10*3/uL (ref 150–400)
RBC: 4.18 MIL/uL (ref 3.87–5.11)
RDW: 15.2 % (ref 11.5–15.5)
WBC: 13.1 10*3/uL — ABNORMAL HIGH (ref 4.0–10.5)

## 2013-07-30 MED ORDER — DOCUSATE SODIUM 100 MG PO CAPS
100.0000 mg | ORAL_CAPSULE | Freq: Every day | ORAL | Status: DC
  Administered 2013-07-31: 100 mg via ORAL
  Filled 2013-07-30: qty 1

## 2013-07-30 MED ORDER — DEXTROSE 5 % IN LACTATED RINGERS IV BOLUS: 100.0000 mL | Freq: Once | INTRAVENOUS | Status: DC

## 2013-07-30 MED ORDER — OXYCODONE-ACETAMINOPHEN 5-325 MG PO TABS
1.0000 | ORAL_TABLET | Freq: Four times a day (QID) | ORAL | Status: DC | PRN
  Administered 2013-07-31 – 2013-08-01 (×4): 2 via ORAL
  Filled 2013-07-30 (×4): qty 2

## 2013-07-30 MED ORDER — ACETAMINOPHEN 325 MG PO TABS: 650.0000 mg | ORAL_TABLET | ORAL | Status: DC | PRN

## 2013-07-30 MED ORDER — HYDROMORPHONE HCL PF 1 MG/ML IJ SOLN
INTRAMUSCULAR | Status: AC
  Filled 2013-07-30: qty 1

## 2013-07-30 MED ORDER — DEXTROSE IN LACTATED RINGERS 5 % IV SOLN
INTRAVENOUS | Status: AC
  Administered 2013-07-30: 17:00:00 via INTRAVENOUS

## 2013-07-30 MED ORDER — PRENATAL MULTIVITAMIN CH
1.0000 | ORAL_TABLET | Freq: Every day | ORAL | Status: DC
  Administered 2013-07-30 – 2013-08-01 (×3): 1 via ORAL
  Filled 2013-07-30 (×3): qty 1

## 2013-07-30 MED ORDER — ZOLPIDEM TARTRATE 5 MG PO TABS: 5.0000 mg | ORAL_TABLET | Freq: Every evening | ORAL | Status: DC | PRN

## 2013-07-30 MED ORDER — DEXTROSE 5 % IN LACTATED RINGERS IV BOLUS
1000.0000 mL | Freq: Once | INTRAVENOUS | Status: AC
  Administered 2013-07-30: 1000 mL via INTRAVENOUS

## 2013-07-30 MED ORDER — CALCIUM CARBONATE ANTACID 500 MG PO CHEW: 2.0000 | CHEWABLE_TABLET | ORAL | Status: DC | PRN

## 2013-07-30 MED ORDER — SODIUM CHLORIDE 0.9 % IV SOLN
3.0000 g | Freq: Four times a day (QID) | INTRAVENOUS | Status: DC
  Administered 2013-07-30 – 2013-08-01 (×8): 3 g via INTRAVENOUS
  Filled 2013-07-30 (×9): qty 3

## 2013-07-30 MED ORDER — DEXTROSE-NACL 5-0.45 % IV SOLN: INTRAVENOUS | Status: DC

## 2013-07-30 MED ORDER — HYDROMORPHONE HCL PF 1 MG/ML IJ SOLN
1.0000 mg | INTRAMUSCULAR | Status: DC | PRN
  Administered 2013-07-30: 1 mg via INTRAVENOUS
  Administered 2013-07-30: 2 mg via INTRAVENOUS
  Administered 2013-07-30 – 2013-07-31 (×2): 1 mg via INTRAVENOUS
  Administered 2013-07-31 (×2): 2 mg via INTRAVENOUS
  Filled 2013-07-30 (×3): qty 1
  Filled 2013-07-30 (×3): qty 2

## 2013-07-30 NOTE — MAU Note (Addendum)
Seen by dentist today, sent over with letter for IV antibiotics. Had been on oral antibiotics - started on Sat.  Infection is getting worse, that is why the dentist wants her to be on IV rx.  Fever chills, body shakes and diarrhea yesterday

## 2013-07-30 NOTE — H&P (Signed)
FACULTY PRACTICE ANTEPARTUM ADMISSION HISTORY AND PHYSICAL NOTE   History of Present Illness: Stephanie Davies is a 38 y.o. G1P0 at [redacted]w[redacted]d admitted for dental abscess unresponsive to oral antibiotics. Patient reports the fetal movement as active. Patient reports uterine contraction  activity as none. Patient reports  vaginal bleeding as none. Patient describes fluid per vagina as None. Fetal presentation is unsure.  Patient Active Problem List   Diagnosis Date Noted  . Dental abscess 07/30/2013  . Pain of round ligament affecting pregnancy, antepartum 07/20/2013  . Itching 06/12/2013  . Rh negative status during pregnancy 06/06/2013  . Supervision of low-risk pregnancy 06/06/2013  . Need for diphtheria-tetanus-pertussis (Tdap) vaccine 06/05/2013    Past Medical History  Diagnosis Date  . Alopecia   . Pseudotumor cerebri   . Headache(784.0)   . Kidney stone     Past Surgical History  Procedure Laterality Date  . Cholecystectomy      OB History  Gravida Para Term Preterm AB SAB TAB Ectopic Multiple Living  1             # Outcome Date GA Lbr Len/2nd Weight Sex Delivery Anes PTL Lv  1 CUR               History   Social History  . Marital Status: Married    Spouse Name: N/A    Number of Children: N/A  . Years of Education: N/A   Social History Main Topics  . Smoking status: Never Smoker   . Smokeless tobacco: Never Used  . Alcohol Use: No  . Drug Use: No  . Sexual Activity: Not Currently    Birth Control/ Protection: None   Other Topics Concern  . None   Social History Narrative  . None    Family History  Problem Relation Age of Onset  . Esophageal cancer Maternal Grandmother   . Esophageal cancer Maternal Grandfather   . Diabetes Maternal Grandfather   . Diabetes Mother   . Hypertension Mother     No Known Allergies  Prescriptions prior to admission  Medication Sig Dispense Refill  . acetaminophen (TYLENOL) 325 MG tablet Take 650 mg by mouth  every 6 (six) hours as needed for mild pain or moderate pain.      . clindamycin (CLEOCIN) 300 MG capsule Take 1 capsule (300 mg total) by mouth 3 (three) times daily.  21 capsule  0  . HYDROcodone-acetaminophen (NORCO/VICODIN) 5-325 MG per tablet Take 1 tablet by mouth once.  20 tablet  0  . Prenatal Multivit-Min-Fe-FA (PRENATAL VITAMINS) 0.8 MG tablet Take 1 tablet by mouth daily.  30 tablet  12    Review of Systems - Negative except pain in right lower jaw for several days.  Was seen by dentisit and given antibiotics but, despite 2 different antibiotics pt still c/o pain and swelling.  Vitals:  BP 124/63  Pulse 106  Temp(Src) 98.4 F (36.9 C) (Oral)  Resp 18  Ht 5\' 5"  (1.651 m)  Wt 256 lb (116.121 kg)  BMI 42.60 kg/m2  SpO2 98%  LMP 11/17/2012 Physical Examination: GENERAL: Well-developed, well-nourished female in no acute distress.  LUNGS: Clear to auscultation bilaterally.  HEART: Regular rate and rhythm. ABDOMEN: Soft, nontender, nondistended. No organomegaly. EXTREMITIES: No cyanosis, clubbing, or edema, 2+ distal pulses  CERVIX: Not evaluated.   Membranes:intact Fetal Monitoring:Baseline: 140's bpm, Variability: Good {> 6 bpm), Accelerations: Reactive and Decelerations: Absent Tocometer: Flat  Labs: cbc pending  Imaging Studies: No results found.  Assessment and Plan: Patient Active Problem List   Diagnosis Date Noted  . Dental abscess 07/30/2013  . Pain of round ligament affecting pregnancy, antepartum 07/20/2013  . Itching 06/12/2013  . Rh negative status during pregnancy 06/06/2013  . Supervision of low-risk pregnancy 06/06/2013  . Need for diphtheria-tetanus-pertussis (Tdap) vaccine 06/05/2013   Admit to Antenatal Unasyn 3gm IVPB q 6 hours Routine antenatal care  Mikella Linsley L. Harraway-Smith, M.D., Evern CoreFACOG

## 2013-07-31 LAB — CBC
HCT: 30.8 % — ABNORMAL LOW (ref 36.0–46.0)
Hemoglobin: 10.1 g/dL — ABNORMAL LOW (ref 12.0–15.0)
MCH: 27.1 pg (ref 26.0–34.0)
MCHC: 32.8 g/dL (ref 30.0–36.0)
MCV: 82.6 fL (ref 78.0–100.0)
Platelets: 387 10*3/uL (ref 150–400)
RBC: 3.73 MIL/uL — ABNORMAL LOW (ref 3.87–5.11)
RDW: 15.3 % (ref 11.5–15.5)
WBC: 12.8 10*3/uL — ABNORMAL HIGH (ref 4.0–10.5)

## 2013-07-31 NOTE — Progress Notes (Signed)
Patient ID: Stephanie Davies, female   DOB: 09-Sep-1975, 38 y.o.   MRN: [id number] ACULTY PRACTICE ANTEPARTUM COMPREHENSIVE PROGRESS NOTE  Stephanie Davies is a 38 y.o. G1P0 at [redacted]w[redacted]d  who is admitted for IV atbx for dental abscess no responsive to po atbx .   Fetal presentation is unsure. Length of Stay:  1  Days  Subjective: Pt reports that her pain is improved.  She wants to try a soft regular diet. Patient reports good fetal movement.  She reports no uterine contractions, no bleeding and no loss of fluid per vagina.  Vitals:  Blood pressure 115/64, pulse 108, temperature 98.7 F (37.1 C), temperature source Oral, resp. rate 18, height 5\' 5"  (1.651 m), weight 256 lb (116.121 kg), last menstrual period 11/17/2012, SpO2 98.00%. Physical Examination: General appearance - alert, well appearing, and in no distress HEENT: facial swelling on right is decreased.  There is tenderness and a 2-3 cm knot over her mandible on the right side.  This is still exquisitely tender   Abdomen - soft, nontender, nondistended, no masses or organomegaly Gravid Extremities: no edema, redness or tenderness in the calves or thighs   Fetal Monitoring:  Baseline: 120's bpm, Variability: Good {> 6 bpm) and Accelerations: Reactive  Labs:  Results for orders placed during the hospital encounter of 07/30/13 (from the past 24 hour(s))  CBC WITH DIFFERENTIAL   Collection Time    07/30/13 12:45 PM      Result Value Ref Range   WBC 13.1 (*) 4.0 - 10.5 K/uL   RBC 4.18  3.87 - 5.11 MIL/uL   Hemoglobin 11.5 (*) 12.0 - 15.0 g/dL   HCT 40.9 (*) 81.1 - 91.4 %   MCV 82.3  78.0 - 100.0 fL   MCH 27.5  26.0 - 34.0 pg   MCHC 33.4  30.0 - 36.0 g/dL   RDW 78.2  95.6 - 21.3 %   Platelets 391  150 - 400 K/uL   Neutrophils Relative % 84 (*) 43 - 77 %   Neutro Abs 11.0 (*) 1.7 - 7.7 K/uL   Lymphocytes Relative 8 (*) 12 - 46 %   Lymphs Abs 1.0  0.7 - 4.0 K/uL   Monocytes Relative 7  3 - 12 %   Monocytes Absolute 1.0  0.1 - 1.0 K/uL   Eosinophils Relative 1  0 - 5 %   Eosinophils Absolute 0.2  0.0 - 0.7 K/uL   Basophils Relative 0  0 - 1 %   Basophils Absolute 0.0  0.0 - 0.1 K/uL  TYPE AND SCREEN   Collection Time    07/30/13  3:00 PM      Result Value Ref Range   ABO/RH(D) O NEG     Antibody Screen POS     Sample Expiration 08/02/2013     DAT, IgG NEG     Antibody Identification NO CLINICALLY SIGNIFICANT ANTIBODY IDENTIFIED     Unit Number [id number]     Blood Component Type RED CELLS,LR     Unit division 00     Status of Unit ALLOCATED     Transfusion Status OK TO TRANSFUSE     Crossmatch Result COMPATIBLE     Unit Number [id number]     Blood Component Type RBC LR PHER1     Unit division 00     Status of Unit ALLOCATED     Transfusion Status OK TO TRANSFUSE     Crossmatch Result COMPATIBLE    CBC   Collection Time  07/31/13  5:15 AM      Result Value Ref Range   WBC 12.8 (*) 4.0 - 10.5 K/uL   RBC 3.73 (*) 3.87 - 5.11 MIL/uL   Hemoglobin 10.1 (*) 12.0 - 15.0 g/dL   HCT 78.430.8 (*) 69.636.0 - 29.546.0 %   MCV 82.6  78.0 - 100.0 fL   MCH 27.1  26.0 - 34.0 pg   MCHC 32.8  30.0 - 36.0 g/dL   RDW 28.415.3  13.211.5 - 44.015.5 %   Platelets 387  150 - 400 K/uL    Imaging Studies:    n/a   Medications:  Scheduled . ampicillin-sulbactam (UNASYN) IV  3 g Intravenous Q6H  . docusate sodium  100 mg Oral Daily  . prenatal multivitamin  1 tablet Oral Q1200   I have reviewed the patient's current medications.  ASSESSMENT: Patient Active Problem List   Diagnosis Date Noted  . Dental abscess 07/30/2013  . Pain of round ligament affecting pregnancy, antepartum 07/20/2013  . Itching 06/12/2013  . Rh negative status during pregnancy 06/06/2013  . Supervision of low-risk pregnancy 06/06/2013  . Need for diphtheria-tetanus-pertussis (Tdap) vaccine 06/05/2013    PLAN: Keep IV unasyn Advance to regular diet Continue routine antenatal care.   HARRAWAY-SMITH, Kha Hari 07/31/2013,8:41 AM

## 2013-07-31 NOTE — Progress Notes (Signed)
UR completed 

## 2013-08-01 ENCOUNTER — Encounter: Payer: Medicaid Other | Admitting: Obstetrics & Gynecology

## 2013-08-01 DIAGNOSIS — L299 Pruritus, unspecified: Secondary | ICD-10-CM

## 2013-08-01 MED ORDER — OXYCODONE-ACETAMINOPHEN 5-325 MG PO TABS: 1.0000 | ORAL_TABLET | Freq: Four times a day (QID) | ORAL | Status: DC | PRN

## 2013-08-01 MED ORDER — AMOXICILLIN-POT CLAVULANATE 875-125 MG PO TABS: 1.0000 | ORAL_TABLET | Freq: Two times a day (BID) | ORAL | Status: DC

## 2013-08-01 NOTE — Discharge Summary (Signed)
Physician Discharge Summary  Patient ID: Stephanie Davies MRN: [id number] DOB/AGE: October 18, 1975 37 y.o.  Admit date: 07/30/2013 Discharge date: 08/01/2013  Admission Diagnoses: dental abscess, 36.[redacted] weeks EGA  Discharge Diagnoses: same + 36.[redacted] weeks EGA Active Problems:   Dental abscess   Discharged Condition: good  Hospital Course: She was admitted and given IV unasyn. Her ability to eat and smile and generally move her jaw rapidly improved. She remained afebrile and by HD#2, she was improved enough to go home on oral antibiotics.  Consults: None  Significant Diagnostic Studies: cervical cultures were obtained on the day of discharge  Treatments: antibiotics: Unasyn  Discharge Exam: Blood pressure 114/65, pulse 105, temperature 98.5 F (36.9 C), temperature source Oral, resp. rate 20, height 5\' 5"  (1.651 m), weight 116.121 kg (256 lb), last menstrual period 11/17/2012, SpO2 98.00%. General appearance: alert Abd- obese, benign, gravid NST-reviewed and reactive  Disposition: 01-Home or Self Care     Medication List    STOP taking these medications       acetaminophen 325 MG tablet  Commonly known as:  TYLENOL     clindamycin 300 MG capsule  Commonly known as:  CLEOCIN     HYDROcodone-acetaminophen 5-325 MG per tablet  Commonly known as:  NORCO/VICODIN      TAKE these medications       amoxicillin-clavulanate 875-125 MG per tablet  Commonly known as:  AUGMENTIN  Take 1 tablet by mouth 2 (two) times daily.     oxyCODONE-acetaminophen 5-325 MG per tablet  Commonly known as:  PERCOCET/ROXICET  Take 1-2 tablets by mouth every 6 (six) hours as needed for moderate pain or severe pain.     Prenatal Vitamins 0.8 MG tablet  Take 1 tablet by mouth daily.           Follow-up Information   [facility] Schedule an appointment as soon as possible for a visit in 1 week.   Contact information:   [address] [place]  [id number] [phone]      Signed: Allie BossierDOVE,Seema Blum C. 08/01/2013, 8:04 AM

## 2013-08-01 NOTE — Discharge Instructions (Signed)

## 2013-08-01 NOTE — Progress Notes (Signed)
Asked to see this mom in antenatal.. She is being DC today- now 38 Kenly Henckel. Is not able to attend BF classes because they are full. Mom to watch video on Patient Education Channel about breast feeding. Reviewed skin to skin, feeding cues and how to latch baby to breast. Mom asking about making enough milk- encouraged to feed whenever she sees feeding cues and discussed supply and demand. No further questions about breast feeding Asking about large size nursing bras. Suggested looking in our store downstairs. BF brochure given with resources for support after DC. To call prn

## 2013-08-02 LAB — TYPE AND SCREEN
ABO/RH(D): O NEG
Antibody Screen: POSITIVE
DAT, IgG: NEGATIVE
Unit division: 0
Unit division: 0

## 2013-08-09 ENCOUNTER — Ambulatory Visit (INDEPENDENT_AMBULATORY_CARE_PROVIDER_SITE_OTHER): Payer: Medicaid Other | Admitting: Obstetrics & Gynecology

## 2013-08-09 VITALS — BP 116/60 | HR 106 | Temp 98.1°F | Wt 257.5 lb

## 2013-08-09 DIAGNOSIS — K047 Periapical abscess without sinus: Secondary | ICD-10-CM

## 2013-08-09 DIAGNOSIS — Z3493 Encounter for supervision of normal pregnancy, unspecified, third trimester: Secondary | ICD-10-CM

## 2013-08-09 DIAGNOSIS — Z3480 Encounter for supervision of other normal pregnancy, unspecified trimester: Secondary | ICD-10-CM

## 2013-08-09 LAB — POCT URINALYSIS DIP (DEVICE)
Bilirubin Urine: NEGATIVE
Glucose, UA: NEGATIVE mg/dL
Ketones, ur: NEGATIVE mg/dL
Leukocytes, UA: NEGATIVE
Nitrite: NEGATIVE
Protein, ur: NEGATIVE mg/dL
Specific Gravity, Urine: 1.015 (ref 1.005–1.030)
Urobilinogen, UA: 0.2 mg/dL (ref 0.0–1.0)
pH: 6.5 (ref 5.0–8.0)

## 2013-08-09 NOTE — Patient Instructions (Signed)
Return to clinic for any obstetric concerns or go to [facility] for evaluation  

## 2013-08-09 NOTE — Progress Notes (Signed)
Reports moderate pelvic pressure, declines pelvic exam today. Recently had two teeth extracted, dental abscess resolving. Still on antibiotics. No other complaints or concerns.  Fetal movement and labor precautions reviewed.

## 2013-08-16 ENCOUNTER — Encounter: Payer: Self-pay | Admitting: Obstetrics and Gynecology

## 2013-08-16 ENCOUNTER — Ambulatory Visit (INDEPENDENT_AMBULATORY_CARE_PROVIDER_SITE_OTHER): Payer: Medicaid Other | Admitting: Obstetrics and Gynecology

## 2013-08-16 VITALS — BP 116/55 | HR 110 | Temp 98.4°F | Wt 260.6 lb

## 2013-08-16 DIAGNOSIS — O360131 Maternal care for anti-D [Rh] antibodies, third trimester, fetus 1: Secondary | ICD-10-CM

## 2013-08-16 DIAGNOSIS — O360990 Maternal care for other rhesus isoimmunization, unspecified trimester, not applicable or unspecified: Secondary | ICD-10-CM

## 2013-08-16 DIAGNOSIS — O3090 Multiple gestation, unspecified, unspecified trimester: Secondary | ICD-10-CM

## 2013-08-16 DIAGNOSIS — Z3480 Encounter for supervision of other normal pregnancy, unspecified trimester: Secondary | ICD-10-CM

## 2013-08-16 DIAGNOSIS — Z23 Encounter for immunization: Secondary | ICD-10-CM

## 2013-08-16 DIAGNOSIS — Z3493 Encounter for supervision of normal pregnancy, unspecified, third trimester: Secondary | ICD-10-CM

## 2013-08-16 LAB — COMPREHENSIVE METABOLIC PANEL
ALT: 30 U/L (ref 0–35)
AST: 14 U/L (ref 0–37)
Albumin: 3.1 g/dL — ABNORMAL LOW (ref 3.5–5.2)
Alkaline Phosphatase: 158 U/L — ABNORMAL HIGH (ref 39–117)
BUN: 6 mg/dL (ref 6–23)
CO2: 19 mEq/L (ref 19–32)
Calcium: 9.3 mg/dL (ref 8.4–10.5)
Chloride: 107 mEq/L (ref 96–112)
Creat: 0.52 mg/dL (ref 0.50–1.10)
Glucose, Bld: 98 mg/dL (ref 70–99)
Potassium: 4 mEq/L (ref 3.5–5.3)
Sodium: 137 mEq/L (ref 135–145)
Total Bilirubin: 0.2 mg/dL (ref 0.2–1.2)
Total Protein: 5.9 g/dL — ABNORMAL LOW (ref 6.0–8.3)

## 2013-08-16 LAB — POCT URINALYSIS DIP (DEVICE)
Bilirubin Urine: NEGATIVE
Glucose, UA: NEGATIVE mg/dL
Ketones, ur: NEGATIVE mg/dL
Nitrite: NEGATIVE
Protein, ur: NEGATIVE mg/dL
Specific Gravity, Urine: 1.015 (ref 1.005–1.030)
Urobilinogen, UA: 0.2 mg/dL (ref 0.0–1.0)
pH: 6 (ref 5.0–8.0)

## 2013-08-16 LAB — CBC
HCT: 32.1 % — ABNORMAL LOW (ref 36.0–46.0)
Hemoglobin: 11 g/dL — ABNORMAL LOW (ref 12.0–15.0)
MCH: 27 pg (ref 26.0–34.0)
MCHC: 34.3 g/dL (ref 30.0–36.0)
MCV: 78.9 fL (ref 78.0–100.0)
Platelets: 419 10*3/uL — ABNORMAL HIGH (ref 150–400)
RBC: 4.07 MIL/uL (ref 3.87–5.11)
RDW: 15.7 % — ABNORMAL HIGH (ref 11.5–15.5)
WBC: 11 10*3/uL — ABNORMAL HIGH (ref 4.0–10.5)

## 2013-08-16 NOTE — Progress Notes (Signed)
Patient is doing well with several complaints. She reports sinus congestions with headaches relieved by tylenol. She reports epigastric pain uncertain if related to heartburn. Will check cbc, cmp and urine P:C. Patient also reports 3 days of vaginal pruritis, not relieved by monistat. Wet prep collected

## 2013-08-16 NOTE — Progress Notes (Signed)
C/o severe itching x 3 days vaginal area.

## 2013-08-17 LAB — PROTEIN / CREATININE RATIO, URINE
Creatinine, Urine: 111.4 mg/dL
Protein Creatinine Ratio: 0.1 (ref <0.15)
Total Protein, Urine: 11 mg/dL

## 2013-08-17 LAB — WET PREP, GENITAL
Clue Cells Wet Prep HPF POC: NONE SEEN
Trich, Wet Prep: NONE SEEN
WBC, Wet Prep HPF POC: NONE SEEN

## 2013-08-20 ENCOUNTER — Telehealth: Payer: Self-pay | Admitting: General Practice

## 2013-08-20 DIAGNOSIS — B379 Candidiasis, unspecified: Secondary | ICD-10-CM

## 2013-08-20 MED ORDER — FLUCONAZOLE 150 MG PO TABS: 150.0000 mg | ORAL_TABLET | Freq: Once | ORAL | Status: DC

## 2013-08-20 NOTE — Telephone Encounter (Signed)
Patient called and left message that she would like her results and that the doctor said we would call her in something for vaginal itching.  Per chart review, wet prep did come back for yeast. Called patient and informed her of results and medication sent to pharmacy. Patient verbalized understanding and had no other questions

## 2013-08-21 ENCOUNTER — Encounter: Payer: Self-pay | Admitting: Obstetrics and Gynecology

## 2013-08-21 ENCOUNTER — Ambulatory Visit (INDEPENDENT_AMBULATORY_CARE_PROVIDER_SITE_OTHER): Payer: Medicaid Other | Admitting: Obstetrics and Gynecology

## 2013-08-21 VITALS — BP 99/84 | HR 107 | Temp 98.3°F | Wt 261.8 lb

## 2013-08-21 DIAGNOSIS — Z23 Encounter for immunization: Secondary | ICD-10-CM

## 2013-08-21 DIAGNOSIS — O360131 Maternal care for anti-D [Rh] antibodies, third trimester, fetus 1: Secondary | ICD-10-CM

## 2013-08-21 DIAGNOSIS — Z3493 Encounter for supervision of normal pregnancy, unspecified, third trimester: Secondary | ICD-10-CM

## 2013-08-21 DIAGNOSIS — Z3480 Encounter for supervision of other normal pregnancy, unspecified trimester: Secondary | ICD-10-CM

## 2013-08-21 DIAGNOSIS — O360990 Maternal care for other rhesus isoimmunization, unspecified trimester, not applicable or unspecified: Secondary | ICD-10-CM

## 2013-08-21 DIAGNOSIS — O3090 Multiple gestation, unspecified, unspecified trimester: Secondary | ICD-10-CM

## 2013-08-21 LAB — POCT URINALYSIS DIP (DEVICE)
Bilirubin Urine: NEGATIVE
Glucose, UA: NEGATIVE mg/dL
Hgb urine dipstick: NEGATIVE
Ketones, ur: NEGATIVE mg/dL
Leukocytes, UA: NEGATIVE
Nitrite: NEGATIVE
Protein, ur: NEGATIVE mg/dL
Specific Gravity, Urine: 1.02 (ref 1.005–1.030)
Urobilinogen, UA: 0.2 mg/dL (ref 0.0–1.0)
pH: 6.5 (ref 5.0–8.0)

## 2013-08-21 NOTE — Progress Notes (Signed)
Patient is doing well without complaints. FM/labor precautions reviewed. Postdate testing at next visit. Will schedule postdate IOL at 41 weeks

## 2013-08-21 NOTE — Progress Notes (Signed)
Edema in feet.  Reports intermittent pelvic pain.   IOL scheduled for 07/31/13 (friday) at 1930. Patient to be informed at next visit.

## 2013-08-28 ENCOUNTER — Encounter: Payer: Self-pay | Admitting: Obstetrics and Gynecology

## 2013-08-28 ENCOUNTER — Telehealth (HOSPITAL_COMMUNITY): Payer: Self-pay | Admitting: *Deleted

## 2013-08-28 ENCOUNTER — Ambulatory Visit (INDEPENDENT_AMBULATORY_CARE_PROVIDER_SITE_OTHER): Payer: Medicaid Other | Admitting: Obstetrics and Gynecology

## 2013-08-28 ENCOUNTER — Ambulatory Visit (HOSPITAL_COMMUNITY)
Admission: RE | Admit: 2013-08-28 | Discharge: 2013-08-28 | Disposition: A | Discharge status: Home / Self Care | Payer: Medicaid Other | Source: Ambulatory Visit | Attending: Obstetrics and Gynecology | Admitting: Obstetrics and Gynecology

## 2013-08-28 ENCOUNTER — Other Ambulatory Visit (HOSPITAL_COMMUNITY): Payer: Medicaid Other

## 2013-08-28 VITALS — BP 115/70 | HR 109 | Wt 261.4 lb

## 2013-08-28 DIAGNOSIS — O368390 Maternal care for abnormalities of the fetal heart rate or rhythm, unspecified trimester, not applicable or unspecified: Secondary | ICD-10-CM | POA: Diagnosis not present

## 2013-08-28 DIAGNOSIS — O360990 Maternal care for other rhesus isoimmunization, unspecified trimester, not applicable or unspecified: Secondary | ICD-10-CM

## 2013-08-28 DIAGNOSIS — Z3689 Encounter for other specified antenatal screening: Secondary | ICD-10-CM | POA: Diagnosis not present

## 2013-08-28 DIAGNOSIS — O480 Post-term pregnancy: Secondary | ICD-10-CM | POA: Insufficient documentation

## 2013-08-28 DIAGNOSIS — O360131 Maternal care for anti-D [Rh] antibodies, third trimester, fetus 1: Secondary | ICD-10-CM

## 2013-08-28 DIAGNOSIS — O3090 Multiple gestation, unspecified, unspecified trimester: Secondary | ICD-10-CM

## 2013-08-28 DIAGNOSIS — Z3480 Encounter for supervision of other normal pregnancy, unspecified trimester: Secondary | ICD-10-CM

## 2013-08-28 DIAGNOSIS — Z23 Encounter for immunization: Secondary | ICD-10-CM

## 2013-08-28 DIAGNOSIS — Z3493 Encounter for supervision of normal pregnancy, unspecified, third trimester: Secondary | ICD-10-CM

## 2013-08-28 LAB — POCT URINALYSIS DIP (DEVICE)
Bilirubin Urine: NEGATIVE
Glucose, UA: NEGATIVE mg/dL
Ketones, ur: NEGATIVE mg/dL
Leukocytes, UA: NEGATIVE
Nitrite: NEGATIVE
Protein, ur: NEGATIVE mg/dL
Specific Gravity, Urine: 1.02 (ref 1.005–1.030)
Urobilinogen, UA: 0.2 mg/dL (ref 0.0–1.0)
pH: 6 (ref 5.0–8.0)

## 2013-08-28 MED ORDER — ZOLPIDEM TARTRATE 5 MG PO TABS: 5.0000 mg | ORAL_TABLET | Freq: Every evening | ORAL | Status: DC | PRN

## 2013-08-28 NOTE — Progress Notes (Signed)
Pt reports decreased FM x2 days- feels like the baby is stretching but not many kicks.

## 2013-08-28 NOTE — Telephone Encounter (Signed)
Preadmission screen  

## 2013-08-28 NOTE — Progress Notes (Signed)
Patient is doing well without complaints. FM/labor precautions reviewed. Patient scheduled for IOL on 8/21  NST reviewed and category II. Patient sent for BPP

## 2013-08-31 ENCOUNTER — Encounter (HOSPITAL_COMMUNITY): Payer: Self-pay

## 2013-08-31 ENCOUNTER — Inpatient Hospital Stay (HOSPITAL_COMMUNITY)
Admission: RE | Admit: 2013-08-31 | Discharge: 2013-09-05 | DRG: 774 | Disposition: A | Discharge status: Home / Self Care | Payer: Medicaid Other | Source: Ambulatory Visit | Attending: Obstetrics & Gynecology | Admitting: Obstetrics & Gynecology

## 2013-08-31 VITALS — BP 104/55 | HR 85 | Temp 98.6°F | Resp 20 | Ht 66.0 in | Wt 261.0 lb

## 2013-08-31 DIAGNOSIS — L299 Pruritus, unspecified: Secondary | ICD-10-CM

## 2013-08-31 DIAGNOSIS — Z800 Family history of malignant neoplasm of digestive organs: Secondary | ICD-10-CM

## 2013-08-31 DIAGNOSIS — Z3490 Encounter for supervision of normal pregnancy, unspecified, unspecified trimester: Secondary | ICD-10-CM

## 2013-08-31 DIAGNOSIS — E669 Obesity, unspecified: Secondary | ICD-10-CM | POA: Diagnosis present

## 2013-08-31 DIAGNOSIS — O360990 Maternal care for other rhesus isoimmunization, unspecified trimester, not applicable or unspecified: Secondary | ICD-10-CM | POA: Diagnosis not present

## 2013-08-31 DIAGNOSIS — Z8249 Family history of ischemic heart disease and other diseases of the circulatory system: Secondary | ICD-10-CM | POA: Diagnosis not present

## 2013-08-31 DIAGNOSIS — O09519 Supervision of elderly primigravida, unspecified trimester: Secondary | ICD-10-CM | POA: Diagnosis present

## 2013-08-31 DIAGNOSIS — Z6841 Body Mass Index (BMI) 40.0 and over, adult: Secondary | ICD-10-CM | POA: Diagnosis not present

## 2013-08-31 DIAGNOSIS — Z833 Family history of diabetes mellitus: Secondary | ICD-10-CM | POA: Diagnosis not present

## 2013-08-31 DIAGNOSIS — Z87442 Personal history of urinary calculi: Secondary | ICD-10-CM

## 2013-08-31 DIAGNOSIS — O99214 Obesity complicating childbirth: Secondary | ICD-10-CM

## 2013-08-31 DIAGNOSIS — O360131 Maternal care for anti-D [Rh] antibodies, third trimester, fetus 1: Secondary | ICD-10-CM

## 2013-08-31 DIAGNOSIS — O480 Post-term pregnancy: Secondary | ICD-10-CM | POA: Diagnosis present

## 2013-08-31 LAB — CBC
HCT: 35.2 % — ABNORMAL LOW (ref 36.0–46.0)
Hemoglobin: 11.7 g/dL — ABNORMAL LOW (ref 12.0–15.0)
MCH: 26.7 pg (ref 26.0–34.0)
MCHC: 33.2 g/dL (ref 30.0–36.0)
MCV: 80.4 fL (ref 78.0–100.0)
Platelets: 415 10*3/uL — ABNORMAL HIGH (ref 150–400)
RBC: 4.38 MIL/uL (ref 3.87–5.11)
RDW: 15.7 % — ABNORMAL HIGH (ref 11.5–15.5)
WBC: 12.4 10*3/uL — ABNORMAL HIGH (ref 4.0–10.5)

## 2013-08-31 MED ORDER — LIDOCAINE HCL (PF) 1 % IJ SOLN: 30.0000 mL | INTRAMUSCULAR | Status: DC | PRN

## 2013-08-31 MED ORDER — OXYCODONE-ACETAMINOPHEN 5-325 MG PO TABS: 1.0000 | ORAL_TABLET | ORAL | Status: DC | PRN

## 2013-08-31 MED ORDER — LACTATED RINGERS IV SOLN: 500.0000 mL | INTRAVENOUS | Status: DC | PRN

## 2013-08-31 MED ORDER — LACTATED RINGERS IV SOLN
INTRAVENOUS | Status: DC
  Administered 2013-08-31 – 2013-09-02 (×6): via INTRAVENOUS

## 2013-08-31 MED ORDER — OXYTOCIN BOLUS FROM INFUSION
500.0000 mL | INTRAVENOUS | Status: DC
  Administered 2013-09-03: 500 mL via INTRAVENOUS

## 2013-08-31 MED ORDER — LACTATED RINGERS IV SOLN: INTRAVENOUS | Status: DC

## 2013-08-31 MED ORDER — LACTATED RINGERS IV SOLN
500.0000 mL | INTRAVENOUS | Status: DC | PRN
  Administered 2013-09-02 (×2): 500 mL via INTRAVENOUS

## 2013-08-31 MED ORDER — CITRIC ACID-SODIUM CITRATE 334-500 MG/5ML PO SOLN: 30.0000 mL | ORAL | Status: DC | PRN

## 2013-08-31 MED ORDER — OXYTOCIN 40 UNITS IN LACTATED RINGERS INFUSION - SIMPLE MED: 62.5000 mL/h | INTRAVENOUS | Status: DC

## 2013-08-31 MED ORDER — TERBUTALINE SULFATE 1 MG/ML IJ SOLN: 0.2500 mg | Freq: Once | INTRAMUSCULAR | Status: AC | PRN

## 2013-08-31 MED ORDER — ACETAMINOPHEN 325 MG PO TABS
650.0000 mg | ORAL_TABLET | ORAL | Status: DC | PRN
  Administered 2013-09-03: 650 mg via ORAL
  Filled 2013-08-31: qty 2

## 2013-08-31 MED ORDER — ACETAMINOPHEN 325 MG PO TABS: 650.0000 mg | ORAL_TABLET | ORAL | Status: DC | PRN

## 2013-08-31 MED ORDER — LIDOCAINE HCL (PF) 1 % IJ SOLN
30.0000 mL | INTRAMUSCULAR | Status: AC | PRN
  Administered 2013-09-03: 30 mL via SUBCUTANEOUS
  Filled 2013-08-31: qty 30

## 2013-08-31 MED ORDER — IBUPROFEN 600 MG PO TABS
600.0000 mg | ORAL_TABLET | Freq: Four times a day (QID) | ORAL | Status: DC | PRN
  Administered 2013-09-03: 600 mg via ORAL
  Filled 2013-08-31: qty 1

## 2013-08-31 MED ORDER — OXYTOCIN BOLUS FROM INFUSION: 500.0000 mL | INTRAVENOUS | Status: DC

## 2013-08-31 MED ORDER — ONDANSETRON HCL 4 MG/2ML IJ SOLN: 4.0000 mg | Freq: Four times a day (QID) | INTRAMUSCULAR | Status: DC | PRN

## 2013-08-31 MED ORDER — IBUPROFEN 600 MG PO TABS: 600.0000 mg | ORAL_TABLET | Freq: Four times a day (QID) | ORAL | Status: DC | PRN

## 2013-08-31 MED ORDER — ONDANSETRON HCL 4 MG/2ML IJ SOLN
4.0000 mg | Freq: Four times a day (QID) | INTRAMUSCULAR | Status: DC | PRN
  Administered 2013-09-02: 4 mg via INTRAVENOUS
  Filled 2013-08-31: qty 2

## 2013-08-31 MED ORDER — FLEET ENEMA 7-19 GM/118ML RE ENEM: 1.0000 | ENEMA | RECTAL | Status: DC | PRN

## 2013-08-31 MED ORDER — MISOPROSTOL 200 MCG PO TABS
50.0000 ug | ORAL_TABLET | ORAL | Status: DC | PRN
  Administered 2013-08-31 – 2013-09-01 (×2): 50 ug via ORAL
  Filled 2013-08-31 (×2): qty 0.5

## 2013-08-31 NOTE — H&P (Signed)
Stephanie Davies is a 38 y.o. female G1P0 with IUP at 3346w0d presenting for IOL due to PD. She denies any CTx, VB, or LOF and notes good FM. She denies any complications this pregnancy and no meds other than PNV.    [facility]  Prenatal History/Complications: none  Past Medical History: Past Medical History  Diagnosis Date  . Alopecia   . Pseudotumor cerebri   . Headache(784.0)   . Kidney stone     Past Surgical History: Past Surgical History  Procedure Laterality Date  . Cholecystectomy      Obstetrical History: OB History   Grav Para Term Preterm Abortions TAB SAB Ect Mult Living   1               Social History: History   Social History  . Marital Status: Married    Spouse Name: N/A    Number of Children: N/A  . Years of Education: N/A   Social History Main Topics  . Smoking status: Never Smoker   . Smokeless tobacco: Never Used  . Alcohol Use: No  . Drug Use: No  . Sexual Activity: Not Currently    Birth Control/ Protection: None   Other Topics Concern  . None   Social History Narrative  . None    Family History: Family History  Problem Relation Age of Onset  . Esophageal cancer Maternal Grandmother   . Esophageal cancer Maternal Grandfather   . Diabetes Maternal Grandfather   . Diabetes Mother   . Hypertension Mother     Allergies: No Known Allergies  Prescriptions prior to admission  Medication Sig Dispense Refill  . acetaminophen (TYLENOL) 325 MG tablet Take 650 mg by mouth every 6 (six) hours as needed for headache.      . Prenatal Multivit-Min-Fe-FA (PRENATAL VITAMINS) 0.8 MG tablet Take 1 tablet by mouth daily.  30 tablet  12    Review of Systems: Negative unless otherwise stated in History above  Physicial Blood pressure 137/62, pulse 112, temperature 98.5 F (36.9 C), temperature source Oral, resp. rate 18, height 5\' 6"  (1.676 m), weight 118.389 kg (261 lb), last menstrual period 11/17/2012. General appearance: alert  and cooperative Presentation: cephalic Fetal monitoringBaseline: 145 bpm, Variability: Good {> 6 bpm), Accelerations: Reactive and Decelerations: Absent Uterine activityFrequency: Every 5 minutes Dilation: Closed Effacement (%): Thick Station: -2 Exam by:: Dr Gayla DossJoyner  Prenatal labs: ABO, Rh: --/--/O NEG (07/20 1500) Antibody: POS (07/20 1500) Rubella:   RPR: NON REAC (05/26 1025)  HBsAg: NEGATIVE (05/26 1025)  HIV: NONREACTIVE (05/26 1025)  GBS: Negative (07/19 0000)  GTT: normal  Prenatal Transfer Tool  Maternal Diabetes: No Genetic Screening: Declined Maternal Ultrasounds/Referrals: Normal Fetal Ultrasounds or other Referrals:  None Maternal Substance Abuse:  No Significant Maternal Medications:  None Significant Maternal Lab Results: Lab values include: Group B Strep negative  No results found for this or any previous visit (from the past 24 hour(s)).  Assessment: Stephanie Davies is a 38 y.o. G1P0 at 4446w0d by here for IOL due to PD.    #Labor:admit to birthing suite; IOL with PO cytotec #Pain: Labor support #FWB: Cat 1 #ID:  GBS negative #Feeding: Breast #MOC:  ? #Circ:  ? - sex unknown  Wenda LowJames Joyner MD [facility] PGY-2 08/31/2013, 10:03 PM  I have seen and examined this patient and I agree with the above. Cam HaiSHAW, Dazaria Macneill CNM 10:12 PM 08/31/2013

## 2013-09-01 ENCOUNTER — Inpatient Hospital Stay (HOSPITAL_COMMUNITY): Payer: Medicaid Other | Admitting: Anesthesiology

## 2013-09-01 ENCOUNTER — Encounter (HOSPITAL_COMMUNITY): Payer: Medicaid Other | Admitting: Anesthesiology

## 2013-09-01 LAB — RPR

## 2013-09-01 LAB — TYPE AND SCREEN
ABO/RH(D): O NEG
Antibody Screen: NEGATIVE

## 2013-09-01 MED ORDER — FENTANYL 2.5 MCG/ML BUPIVACAINE 1/10 % EPIDURAL INFUSION (WH - ANES)
INTRAMUSCULAR | Status: DC | PRN
  Administered 2013-09-01: 14 mL/h via EPIDURAL
  Administered 2013-09-02: 06:00:00

## 2013-09-01 MED ORDER — TERBUTALINE SULFATE 1 MG/ML IJ SOLN: 0.2500 mg | Freq: Once | INTRAMUSCULAR | Status: AC | PRN

## 2013-09-01 MED ORDER — LIDOCAINE HCL (PF) 1 % IJ SOLN
INTRAMUSCULAR | Status: DC | PRN
  Administered 2013-09-01 (×2): 4 mL

## 2013-09-01 MED ORDER — BUTORPHANOL TARTRATE 1 MG/ML IJ SOLN
1.0000 mg | INTRAMUSCULAR | Status: DC | PRN
  Administered 2013-09-01: 1 mg via INTRAVENOUS
  Filled 2013-09-01: qty 1

## 2013-09-01 MED ORDER — FENTANYL 2.5 MCG/ML BUPIVACAINE 1/10 % EPIDURAL INFUSION (WH - ANES)
14.0000 mL/h | INTRAMUSCULAR | Status: DC | PRN
  Administered 2013-09-01 – 2013-09-02 (×4): 14 mL/h via EPIDURAL
  Filled 2013-09-01 (×5): qty 125

## 2013-09-01 MED ORDER — PHENYLEPHRINE 40 MCG/ML (10ML) SYRINGE FOR IV PUSH (FOR BLOOD PRESSURE SUPPORT)
80.0000 ug | PREFILLED_SYRINGE | INTRAVENOUS | Status: DC | PRN
  Filled 2013-09-01: qty 10
  Filled 2013-09-01: qty 2
  Filled 2013-09-01 (×3): qty 10

## 2013-09-01 MED ORDER — EPHEDRINE 5 MG/ML INJ
10.0000 mg | INTRAVENOUS | Status: DC | PRN
  Filled 2013-09-01: qty 2

## 2013-09-01 MED ORDER — OXYTOCIN 40 UNITS IN LACTATED RINGERS INFUSION - SIMPLE MED: 1.0000 m[IU]/min | INTRAVENOUS | Status: DC

## 2013-09-01 MED ORDER — FENTANYL 2.5 MCG/ML BUPIVACAINE 1/10 % EPIDURAL INFUSION (WH - ANES): 14.0000 mL/h | INTRAMUSCULAR | Status: DC | PRN

## 2013-09-01 MED ORDER — PHENYLEPHRINE 40 MCG/ML (10ML) SYRINGE FOR IV PUSH (FOR BLOOD PRESSURE SUPPORT)
80.0000 ug | PREFILLED_SYRINGE | INTRAVENOUS | Status: DC | PRN
  Filled 2013-09-01: qty 2

## 2013-09-01 MED ORDER — DIPHENHYDRAMINE HCL 50 MG/ML IJ SOLN: 12.5000 mg | INTRAMUSCULAR | Status: DC | PRN

## 2013-09-01 MED ORDER — LACTATED RINGERS IV SOLN: 500.0000 mL | Freq: Once | INTRAVENOUS | Status: DC

## 2013-09-01 MED ORDER — FENTANYL CITRATE 0.05 MG/ML IJ SOLN
100.0000 ug | INTRAMUSCULAR | Status: DC | PRN
  Administered 2013-09-01 (×6): 100 ug via INTRAVENOUS
  Filled 2013-09-01 (×5): qty 2

## 2013-09-01 MED ORDER — FENTANYL CITRATE 0.05 MG/ML IJ SOLN
INTRAMUSCULAR | Status: AC
  Filled 2013-09-01: qty 2

## 2013-09-01 NOTE — Anesthesia Procedure Notes (Signed)
Epidural Patient location during procedure: OB  Staffing Anesthesiologist: Berna Gitto R Performed by: anesthesiologist   Preanesthetic Checklist Completed: patient identified, pre-op evaluation, timeout performed, IV checked, risks and benefits discussed and monitors and equipment checked  Epidural Patient position: sitting Prep: site prepped and draped and DuraPrep Patient monitoring: heart rate Approach: midline Location: L3-L4 Injection technique: LOR air and LOR saline  Needle:  Needle type: Tuohy  Needle gauge: 17 G Needle length: 9 cm Needle insertion depth: 8 cm Catheter type: closed end flexible Catheter size: 19 Gauge Catheter at skin depth: 14 cm Test dose: negative  Assessment Sensory level: T8 Events: blood not aspirated, injection not painful, no injection resistance, negative IV test and no paresthesia  Additional Notes Reason for block:procedure for pain   

## 2013-09-01 NOTE — Progress Notes (Signed)
Stephanie Davies Stephanie Davies is a 38 y.o. G1P0 at 5327w1d admitted for induction of labor due to Post dates. Due date 08/24/13.  Subjective: Doing well; feeling some ctx. No complaints  Objective: BP 103/47  Pulse 99  Temp(Src) 98.1 F (36.7 C) (Oral)  Resp 18  Ht 5\' 6"  (1.676 m)  Wt 118.389 kg (261 lb)  BMI 42.15 kg/m2  LMP 11/17/2012     FHT:  FHR: 145 bpm, variability: moderate,  accelerations:  Present,  decelerations:  Absent UC:   regular, every 2-4 minutes SVE:   Dilation: Closed Effacement (%): Thick Station: -2 Exam by:: Stephanie Davies  Labs: Lab Results  Component Value Date   WBC 12.4* 08/31/2013   HGB 11.7* 08/31/2013   HCT 35.2* 08/31/2013   MCV 80.4 08/31/2013   PLT 415* 08/31/2013    Assessment / Plan: Induction of labor due to postterm,  progressing well on pitocin  Labor: Progressing normally and PO cytotec (50mg ) second dose. Watching for Tachysystole Preeclampsia:  no signs or symptoms of toxicity Fetal Wellbeing:  Category I Pain Control:  Labor support without medications I/D:  GBS neg Anticipated MOD:  NSVD  Stephanie Davies, Stephanie Davies 09/01/2013, 3:02 AM

## 2013-09-01 NOTE — Progress Notes (Signed)
Stephanie Davies is a 39 y.o. G1P0 at [redacted]w[redacted]d admitted for induction of labor due to Post dates. Due date 08/24/13.  Subjective: Feeling a lot of pressure/pain with ctx. Relief with Fentanyl but doesn't last  Objective: BP 125/72  Pulse 86  Temp(Src) 98 F (36.7 C) (Oral)  Resp 20  Ht  (1.676 m)  Wt 118.389 kg (261 lb)  BMI 42.15 kg/m2  LMP 11/17/2012     FHT:  FHR: 145 bpm, variability: moderate,  accelerations:  Present,  decelerations:  Absent UC:   regular, every 1-8 minutes SVE:   Dilation: 4.5 Effacement (%): 70 Station: -2 Exam by:: D. Poe, CNM  Labs: Lab Results  Component Value Date   WBC 12.4* 08/31/2013   HGB 11.7* 08/31/2013   HCT 35.2* 08/31/2013   MCV 80.4 08/31/2013   PLT 415* 08/31/2013    Assessment / Plan: Induction of labor due to postterm,  progressing well now s/p FB   Labor: Progressing normally and Will start Pit once pain controlled on Epidural  Preeclampsia:  no signs or symptoms of toxicity Fetal Wellbeing:  Category I Pain Control:  Fentanyl and Epidural order placed I/D:  GBS neg Anticipated MOD:  NSVD  Wenda Low 09/01/2013, 10:31 PM

## 2013-09-01 NOTE — Anesthesia Preprocedure Evaluation (Signed)
Anesthesia Evaluation  Patient identified by MRN, date of birth, ID band Patient awake    Reviewed: Allergy & Precautions, H&P , NPO status , Patient's Chart, lab work & pertinent test results  Airway Mallampati: II TM Distance: >3 FB Neck ROM: Full    Dental no notable dental hx.    Pulmonary neg pulmonary ROS,  breath sounds clear to auscultation  Pulmonary exam normal       Cardiovascular negative cardio ROS  Rhythm:Regular Rate:Normal     Neuro/Psych  Headaches, Pseudotumor cerebri. Dx 2 yrs ago. No VSP. Asymptomatic currently. negative psych ROS   GI/Hepatic negative GI ROS, Neg liver ROS,   Endo/Other  Morbid obesity  Renal/GU Renal disease     Musculoskeletal negative musculoskeletal ROS (+)   Abdominal (+) + obese,   Peds  Hematology negative hematology ROS (+)   Anesthesia Other Findings   Reproductive/Obstetrics (+) Pregnancy                           Anesthesia Physical Anesthesia Plan  ASA: III  Anesthesia Plan: Epidural   Post-op Pain Management:    Induction:   Airway Management Planned:   Additional Equipment:   Intra-op Plan:   Post-operative Plan:   Informed Consent: I have reviewed the patients History and Physical, chart, labs and discussed the procedure including the risks, benefits and alternatives for the proposed anesthesia with the patient or authorized representative who has indicated his/her understanding and acceptance.     Plan Discussed with:   Anesthesia Plan Comments: (Had long discussion with pt about epidural in setting of elevated ICP. She has been asymptomatic and understands the risks and wishes to proceed./)        Anesthesia Quick Evaluation

## 2013-09-01 NOTE — Progress Notes (Signed)
Records of MRI in 2013 given to anesthesia to review due to hx of pseudotumor ceribri for status of epidural procedure possibility.

## 2013-09-02 MED ORDER — OXYTOCIN 40 UNITS IN LACTATED RINGERS INFUSION - SIMPLE MED: 1.0000 m[IU]/min | INTRAVENOUS | Status: DC

## 2013-09-02 MED ORDER — OXYTOCIN 40 UNITS IN LACTATED RINGERS INFUSION - SIMPLE MED
1.0000 m[IU]/min | INTRAVENOUS | Status: DC
  Administered 2013-09-02: 3 m[IU]/min via INTRAVENOUS
  Administered 2013-09-02: 2 m[IU]/min via INTRAVENOUS
  Administered 2013-09-02: 4 m[IU]/min via INTRAVENOUS
  Filled 2013-09-02: qty 1000

## 2013-09-02 MED ORDER — DEXTROSE 5 % IV SOLN
220.0000 mg | Freq: Three times a day (TID) | INTRAVENOUS | Status: DC
  Administered 2013-09-03: 220 mg via INTRAVENOUS
  Filled 2013-09-02 (×3): qty 5.5

## 2013-09-02 MED ORDER — SODIUM BICARBONATE 8.4 % IV SOLN
INTRAVENOUS | Status: DC | PRN
  Administered 2013-09-02: 7 mL via EPIDURAL
  Administered 2013-09-02: 3 mL via EPIDURAL

## 2013-09-02 MED ORDER — SODIUM CHLORIDE 0.9 % IV SOLN
2.0000 g | Freq: Four times a day (QID) | INTRAVENOUS | Status: DC
  Administered 2013-09-03: 2 g via INTRAVENOUS
  Filled 2013-09-02 (×3): qty 2000

## 2013-09-02 MED ORDER — BUPIVACAINE HCL (PF) 0.25 % IJ SOLN
INTRAMUSCULAR | Status: DC | PRN
  Administered 2013-09-02 (×4): 5 mL
  Administered 2013-09-02: 5 mL via EPIDURAL
  Administered 2013-09-02: 5 mL

## 2013-09-02 NOTE — Progress Notes (Signed)
Stephanie Davies is a 38 y.o. G1P0 at [redacted]w[redacted]d.   Subjective: Left -sided pain w/ epidural, but unable to lie on that side due to pt pain and decels.   Objective: BP 118/62  Pulse 92  Temp(Src) 97.9 F (36.6 C) (Oral)  Resp 19  Ht  (1.676 m)  Wt 118.389 kg (261 lb)  BMI 42.15 kg/m2  LMP 11/17/2012 I/O last 3 completed shifts: In: -  Out: 1000 [Urine:1000]    FHT:  FHR: 135 bpm, variability: moderate,  accelerations:  Present,  decelerations:  Present occassional variables and lates, resolved w/ position change UC:   irregular, difficult to trace due w/ pt in position that baby tolerates. SVE:   Dilation: 5 Effacement (%): 70 Station: -3 Exam by:: [place], CNM AROM small amount of clear fluid. IUPC placed w/out difficulty  Labs: Lab Results  Component Value Date   WBC 12.4* 08/31/2013   HGB 11.7* 08/31/2013   HCT 35.2* 08/31/2013   MCV 80.4 08/31/2013   PLT 415* 08/31/2013    Assessment / Plan: Induction of labor due to postterm,  progressing well on pitocin  Labor: Progressing normally Preeclampsia:  NA Fetal Wellbeing:  Category I and Category II Pain Control:  Epidural I/D:  n/a Anticipated MOD:  NSVD  Salaam Battershell 09/02/2013, 6:40 AM

## 2013-09-02 NOTE — Progress Notes (Addendum)
POC explained to pt and family to discontinue pitocin until 1830.

## 2013-09-02 NOTE — Progress Notes (Signed)
LABOR PROGRESS NOTE  Stephanie Davies is a 38 y.o. G1P0 at [redacted]w[redacted]d  admitted for induction of labor due to prolonged pregnancy.  Subjective: Currently very comfortable  Objective: BP 118/62  Pulse 89  Temp(Src) 98.1 F (36.7 C) (Oral)  Resp 18  Ht  (1.676 m)  Wt 261 lb (118.389 kg)  BMI 42.15 kg/m2  SpO2 99%  LMP 11/17/2012 or  Filed Vitals:   09/02/13 1056 09/02/13 1101 09/02/13 1131 09/02/13 1153  BP: 120/60 117/63 118/62   Pulse: 93 90 89   Temp:      TempSrc:      Resp:  Height:      Weight:      SpO2:           FHT:  FHR: 145 bpm, variability: moderate,  accelerations:  Present,  decelerations:  Present recurrent lates when patient on left side, currently no lates UC:   regular, every 2-3  minutes SVE:   Dilation: 6.5 Effacement (%): 80 Station: -1 Exam by:: rzhang,rnc-ob  Dilation: 6.5 Effacement (%): 80 Cervical Position: Anterior Station: -1 Presentation: Vertex Exam by:: rzhang,rnc-ob  Pitocin @ 3 mu/min  Labs: Lab Results  Component Value Date   WBC 12.4* 08/31/2013   HGB 11.7* 08/31/2013   HCT 35.2* 08/31/2013   MCV 80.4 08/31/2013   PLT 415* 08/31/2013    Assessment / Plan: Induction of labor due to postterm,  progressing well on pitocin  Labor: Progressing on Pitocin, will continue to increase if able then AROM Fetal Wellbeing:  Category II, cat I Pain Control:  Epidural Anticipated MOD:  NSVD  Stephanie Davies [name], MD 09/02/2013, 12:22 PM

## 2013-09-02 NOTE — Progress Notes (Addendum)
Patient ID: Stephanie Davies, female   DOB: 12/30/1975, 38 y.o.   MRN: [id number]  Doing well.  Comfortable with epidural redose.  Filed Vitals:   09/02/13 2211  BP: 123/56  Pulse: 105  Temp:   Resp:    FHR reactive at times, reassuring UCs adequate MVUs  Dilation: 8.5 Effacement (%): 90 Cervical Position: Anterior Station: 0 Presentation: Vertex Exam by:: Tarry Kos RN  Plan: Observe for second stage progression Add Amp/Gent for maternal fever 100.6

## 2013-09-02 NOTE — Progress Notes (Signed)
Pluma Birnbaum is a 38 y.o. G1P0 at [redacted]w[redacted]d.  Subjective: Comfortable w/ epidural.  Objective: BP 116/56  Pulse 86  Temp(Src) 98 F (36.7 C) (Oral)  Resp 18  Ht  (1.676 m)  Wt 118.389 kg (261 lb)  BMI 42.15 kg/m2  LMP 11/17/2012 I/O last 3 completed shifts: In: -  Out: 1000 [Urine:1000]    FHT:  FHR: 140 bpm, variability: moderate,  accelerations:  Present,  decelerations:  Absent UC:   irregular, every 5-8 minutes SVE:   Dilation: 5 Effacement (%): 70 Station: -3 Exam by:: [place], CNM  Labs: Lab Results  Component Value Date   WBC 12.4* 08/31/2013   HGB 11.7* 08/31/2013   HCT 35.2* 08/31/2013   MCV 80.4 08/31/2013   PLT 415* 08/31/2013    Assessment / Plan: Induction of labor due to postterm, on pitocin  Labor: Progressing normally Preeclampsia:  NA Fetal Wellbeing:  Category I Pain Control:  Epidural I/D:  n/a Anticipated MOD:  NSVD Continue increasing pitocin  Mykeal Carrick 09/02/2013, 4:14 AM

## 2013-09-02 NOTE — Plan of Care (Signed)
Problem: Phase I Progression Outcomes Goal: Adequate progression of labor Outcome: Progressing IOL Goal: Induction meds as ordered Outcome: Completed/Met Date Met:  09/02/13 AS BABY TOLERATES

## 2013-09-02 NOTE — Progress Notes (Signed)
LABOR PROGRESS NOTE  Stephanie Davies is a 38 y.o. G1P0 at [redacted]w[redacted]d  admitted for induction of labor due to prolonged pregnancy.  Subjective: Currently very comfortable, s/p re-dosing of epidural  Objective: BP 141/54  Pulse 107  Temp(Src) 100.3 F (37.9 C) (Oral)  Resp 20  Ht  (1.676 m)  Wt 261 lb (118.389 kg)  BMI 42.15 kg/m2  SpO2 98%  LMP 11/17/2012 or  Filed Vitals:   09/02/13 1722 09/02/13 1726 09/02/13 1727 09/02/13 1731  BP:   123/68 141/54  Pulse: 122 114 106 107  Temp:      TempSrc:      Resp:   18 20  Height:      Weight:      SpO2:  98%      Total I/O In: -  Out: 400 [Urine:400]  FHT:  FHR: 145 bpm, variability: minimal to moderate,  accelerations:  Present,  decelerations:  None UC:   regular, every 2-3  minutes SVE:   Dilation: 9 Effacement (%): 100 Station: 0 Exam by:: dr Loreta Ave  Dilation: 9 Effacement (%): 100 Cervical Position: Anterior Station: 0 Presentation: Vertex Exam by:: dr Loreta Ave  Pitocin @ 3 mu/min  Labs: Lab Results  Component Value Date   WBC 12.4* 08/31/2013   HGB 11.7* 08/31/2013   HCT 35.2* 08/31/2013   MCV 80.4 08/31/2013   PLT 415* 08/31/2013    Assessment / Plan: Induction of labor due to postterm,  progressing well on pitocin  Labor: Progressing on Pitocin, will continue to increase if able then AROM Fetal Wellbeing:  Category II, cat I Pain Control:  Epidural Anticipated MOD:  NSVD  Stephanie Davies [name], MD 09/02/2013, 5:52 PM

## 2013-09-02 NOTE — Progress Notes (Signed)
Pt wearing oxygen per her request because she states it makes her feel better.

## 2013-09-02 NOTE — Progress Notes (Signed)
Stephanie Davies is a 38 y.o. G1P0 at [redacted]w[redacted]d.  Subjective: Comfortable w/ epidural.  Objective: BP 116/56  Pulse 86  Temp(Src) 98 F (36.7 C) (Oral)  Resp 18  Ht  (1.676 m)  Wt 118.389 kg (261 lb)  BMI 42.15 kg/m2  LMP 11/17/2012      FHT:  FHR: 145 bpm, variability: moderate,  accelerations:  Present,  decelerations:  Absent UC:   irregular, every 6 minutes SVE:   Dilation: 4 Effacement (%): 70 Station: -3 Exam by:: Stephanie Poles RN    Labs: Lab Results  Component Value Date   WBC 12.4* 08/31/2013   HGB 11.7* 08/31/2013   HCT 35.2* 08/31/2013   MCV 80.4 08/31/2013   PLT 415* 08/31/2013    Assessment / Plan: Induction of labor due to postterm, holding pitocin while pt getting epidural.   Labor: Progressing normally Preeclampsia:  NA Fetal Wellbeing:  Category I Pain Control:  Epidural I/D:  n/a Anticipated MOD:  NSVD Start pitocin  Stephanie Davies 09/02/2013, 1:16 AM

## 2013-09-02 NOTE — Progress Notes (Signed)
ANTIBIOTIC CONSULT NOTE - INITIAL  Pharmacy Consult for Gentamicin Indication: Chorioamnionitis   No Known Allergies  Patient Measurements: Height:  (167.6 cm) Weight: 261 lb (118.389 kg) IBW/kg (Calculated) : 59.3 Adjusted Body Weight: 77 kg  Vital Signs: Temp: 100.6 F (38.1 C) (08/23 2054) Temp src: Axillary (08/23 2054) BP: 131/67 mmHg (08/23 2301) Pulse Rate: 108 (08/23 2301)  Labs:  Recent Labs  08/31/13 2105  WBC 12.4*  HGB 11.7*  PLT 415*   No results found for this basename: GENTTROUGH, GENTPEAK, GENTRANDOM,  in the last 72 hours   Microbiology: Recent Results (from the past 720 hour(s))  WET PREP, GENITAL     Status: Abnormal   Collection Time    08/16/13  8:42 AM      Result Value Ref Range Status   Yeast Wet Prep HPF POC FEW (*) NONE SEEN Final   Trich, Wet Prep NONE SEEN  NONE SEEN Final   Clue Cells Wet Prep HPF POC NONE SEEN  NONE SEEN Final   WBC, Wet Prep HPF POC NONE SEEN  NONE SEEN Final    Medications:  Ampicillin 2grams Q 6  Assessment: 38 y.o. female G1P0 at [redacted]w[redacted]d with labor fever Estimated Ke = 0.364, Vd = 0.4 L/kg  Goal of Therapy:  Gentamicin peak 6-8 mg/L and Trough < 1 mg/L  Plan:  Gentamicin 220 mg IV every 8 hrs  Check Scr with next labs if gentamicin continued. Will check gentamicin levels if continued > 72hr or clinically indicated.  Stephanie Davies 09/02/2013,11:55 PM

## 2013-09-03 ENCOUNTER — Encounter (HOSPITAL_COMMUNITY): Payer: Self-pay

## 2013-09-03 DIAGNOSIS — O360990 Maternal care for other rhesus isoimmunization, unspecified trimester, not applicable or unspecified: Secondary | ICD-10-CM

## 2013-09-03 MED ORDER — PRENATAL MULTIVITAMIN CH
1.0000 | ORAL_TABLET | Freq: Every day | ORAL | Status: DC
  Administered 2013-09-03 – 2013-09-05 (×3): 1 via ORAL
  Filled 2013-09-03 (×2): qty 1
  Filled 2013-09-03: qty 2

## 2013-09-03 MED ORDER — MISOPROSTOL 200 MCG PO TABS
800.0000 ug | ORAL_TABLET | Freq: Once | ORAL | Status: AC
  Administered 2013-09-03: 800 ug via RECTAL

## 2013-09-03 MED ORDER — TETANUS-DIPHTH-ACELL PERTUSSIS 5-2.5-18.5 LF-MCG/0.5 IM SUSP: 0.5000 mL | Freq: Once | INTRAMUSCULAR | Status: DC

## 2013-09-03 MED ORDER — IBUPROFEN 600 MG PO TABS
600.0000 mg | ORAL_TABLET | Freq: Four times a day (QID) | ORAL | Status: DC
  Administered 2013-09-03 – 2013-09-05 (×10): 600 mg via ORAL
  Filled 2013-09-03 (×9): qty 1

## 2013-09-03 MED ORDER — RHO D IMMUNE GLOBULIN 1500 UNIT/2ML IJ SOSY
300.0000 ug | PREFILLED_SYRINGE | Freq: Once | INTRAMUSCULAR | Status: AC
  Administered 2013-09-03: 300 ug via INTRAVENOUS
  Filled 2013-09-03: qty 2

## 2013-09-03 MED ORDER — WITCH HAZEL-GLYCERIN EX PADS
1.0000 | MEDICATED_PAD | CUTANEOUS | Status: DC | PRN
Start: 2013-09-03 — End: 2013-09-05

## 2013-09-03 MED ORDER — ONDANSETRON HCL 4 MG/2ML IJ SOLN: 4.0000 mg | INTRAMUSCULAR | Status: DC | PRN

## 2013-09-03 MED ORDER — OXYCODONE-ACETAMINOPHEN 5-325 MG PO TABS
1.0000 | ORAL_TABLET | ORAL | Status: DC | PRN
  Administered 2013-09-03 – 2013-09-04 (×5): 1 via ORAL
  Administered 2013-09-05: 2 via ORAL
  Administered 2013-09-05: 1 via ORAL
  Filled 2013-09-03: qty 2
  Filled 2013-09-03 (×6): qty 1

## 2013-09-03 MED ORDER — MISOPROSTOL 200 MCG PO TABS
ORAL_TABLET | ORAL | Status: AC
  Filled 2013-09-03: qty 4

## 2013-09-03 MED ORDER — LANOLIN HYDROUS EX OINT: TOPICAL_OINTMENT | CUTANEOUS | Status: DC | PRN

## 2013-09-03 MED ORDER — ZOLPIDEM TARTRATE 5 MG PO TABS: 5.0000 mg | ORAL_TABLET | Freq: Every evening | ORAL | Status: DC | PRN

## 2013-09-03 MED ORDER — DIPHENHYDRAMINE HCL 25 MG PO CAPS: 25.0000 mg | ORAL_CAPSULE | Freq: Four times a day (QID) | ORAL | Status: DC | PRN

## 2013-09-03 MED ORDER — SIMETHICONE 80 MG PO CHEW
80.0000 mg | CHEWABLE_TABLET | ORAL | Status: DC | PRN
Start: 2013-09-03 — End: 2013-09-05

## 2013-09-03 MED ORDER — SENNOSIDES-DOCUSATE SODIUM 8.6-50 MG PO TABS
2.0000 | ORAL_TABLET | ORAL | Status: DC
  Administered 2013-09-04 – 2013-09-05 (×2): 2 via ORAL
  Filled 2013-09-03 (×2): qty 2

## 2013-09-03 MED ORDER — ONDANSETRON HCL 4 MG PO TABS
4.0000 mg | ORAL_TABLET | ORAL | Status: DC | PRN
  Administered 2013-09-04: 4 mg via ORAL
  Filled 2013-09-03: qty 1

## 2013-09-03 MED ORDER — BENZOCAINE-MENTHOL 20-0.5 % EX AERO
1.0000 | INHALATION_SPRAY | CUTANEOUS | Status: DC | PRN
Start: 2013-09-03 — End: 2013-09-05
  Administered 2013-09-03: 1 via TOPICAL
  Filled 2013-09-03: qty 56

## 2013-09-03 MED ORDER — DIBUCAINE 1 % RE OINT
1.0000 | TOPICAL_OINTMENT | RECTAL | Status: DC | PRN
Start: 2013-09-03 — End: 2013-09-05
  Administered 2013-09-03: 1 via RECTAL
  Filled 2013-09-03: qty 28

## 2013-09-03 NOTE — Anesthesia Postprocedure Evaluation (Signed)
  Anesthesia Post-op Note  Anesthesia Post Note  Patient: Stephanie Davies  Procedure(s) Performed: * No procedures listed *  Anesthesia type: Epidural  Patient location: Mother/Baby  Post pain: Pain level controlled  Post assessment: Post-op Vital signs reviewed  Last Vitals:  Filed Vitals:   09/03/13 0740  BP: 124/57  Pulse: 90  Temp: 37.1 C  Resp: 18    Post vital signs: Reviewed  Level of consciousness:alert  Complications: No apparent anesthesia complications

## 2013-09-03 NOTE — Progress Notes (Signed)
Patient ID: Stephanie Davies, female   DOB: 08-07-1975, 38 y.o.   MRN: [id number]  Pushing well, since about midnight (1.5 hrs) Frustrated, requesting vacuum  Has made progress from -1 to +1+2 station.  FHR stable, with early decels at times  UCs every 2 min  Discussed risks of vacuum, would like to avoid unless becomes necessary.

## 2013-09-03 NOTE — Progress Notes (Signed)
Ur chart review completed.  

## 2013-09-03 NOTE — Lactation Note (Signed)
This note was copied from the chart of Stephanie Coriana Abebe. Lactation Consultation Note  Initial LC visit. Baby is 14 hours old and has had brief feedings. Baby showing feeding cues. Assisted mother with postioning, latching and basic breastfeeding information. Baby latched well in the cross cradle position and  feeding in a rhythmic pattern. Mother denies discomfort. She is very attentive to breastfeeding education and motivated. Mom encouraged to feed baby 8-12 times/24 hours and with feeding cues. Mother informed of post-discharge support and given phone number to the lactation department, including services for phone call assistance; out-patient appointments; and breastfeeding support group. List of other breastfeeding resources in the community given in the handout. Encouraged mother to call for problems or concerns related to breastfeeding. Encouraged to ask RN for assistance as needed. LC to follow.   Patient Name: Stephanie Davies [id number] Date: 09/03/2013 Reason for consult: Initial assessment;Other (Comment) (AMA, first baby)   Maternal Data Formula Feeding for Exclusion: No Has patient been taught Hand Expression?: Yes Does the patient have breastfeeding experience prior to this delivery?: No  Feeding Feeding Type: Breast Fed  LATCH Score/Interventions Latch: Grasps breast easily, tongue down, lips flanged, rhythmical sucking. Intervention(s): Assist with latch;Adjust position  Audible Swallowing: A few with stimulation Intervention(s): Hand expression  Type of Nipple: Everted at rest and after stimulation  Comfort (Breast/Nipple): Soft / non-tender     Hold (Positioning): Assistance needed to correctly position infant at breast and maintain latch. Intervention(s): Breastfeeding basics reviewed;Support Pillows;Position options;Skin to skin  LATCH Score: 8  Lactation Tools Discussed/Used     Consult Status Consult Status: Follow-up Date: 09/04/13 Follow-up type:  In-patient    Christella Hartigan M 09/03/2013, 7:03 PM

## 2013-09-04 LAB — RH IG WORKUP (INCLUDES ABO/RH)
ABO/RH(D): O NEG
Fetal Screen: NEGATIVE
Gestational Age(Wks): 41.3
Unit division: 0

## 2013-09-04 NOTE — Progress Notes (Signed)
Post Partum Day 1 Subjective:  Stephanie Davies is a 38 y.o. G1P1001 [redacted]w[redacted]d s/p NSVD.  No acute events overnight.  Pt denies problems with ambulating, voiding or po intake.  She denies nausea or vomiting.  Pain is well controlled.  She has had flatus. She has not had bowel movement.  Lochia Small.  Plan for birth control is still deciding.  Method of Feeding: breast  Objective: Blood pressure 108/51, pulse 96, temperature 98 F (36.7 C), temperature source Oral, resp. rate 18, height  (1.676 m), weight 118.389 kg (261 lb), last menstrual period 11/17/2012, SpO2 97.00%, unknown if currently breastfeeding.  Physical Exam:  General: alert, cooperative and no distress Lochia:normal flow Abdomen: +BS, soft, nontender,  Uterine Fundus: firm DVT Evaluation: No evidence of DVT seen on physical exam. Extremities: No edema  No results found for this basename: HGB, HCT,  in the last 72 hours  Assessment/Plan:  ASSESSMENT: Stephanie Davies is a 24 y.o. G1P1001 [redacted]w[redacted]d s/p NSVD - Routine post partum care.   Plan for discharge tomorrow   LOS: 4 days   William Dalton 09/04/2013, 10:02 AM

## 2013-09-05 MED ORDER — IBUPROFEN 600 MG PO TABS: 600.0000 mg | ORAL_TABLET | Freq: Four times a day (QID) | ORAL | Status: DC

## 2013-09-05 MED ORDER — OXYCODONE-ACETAMINOPHEN 5-325 MG PO TABS: 1.0000 | ORAL_TABLET | ORAL | Status: DC | PRN

## 2013-09-05 MED ORDER — DOCUSATE SODIUM 100 MG PO CAPS: 100.0000 mg | ORAL_CAPSULE | Freq: Two times a day (BID) | ORAL | Status: DC | PRN

## 2013-09-05 MED ORDER — CYCLOBENZAPRINE HCL 10 MG PO TABS
5.0000 mg | ORAL_TABLET | Freq: Three times a day (TID) | ORAL | Status: DC | PRN
Start: 2013-09-05 — End: 2013-10-05

## 2013-09-05 NOTE — Discharge Instructions (Signed)
Vaginal Delivery, Care After °Refer to this sheet in the next few weeks. These discharge instructions provide you with information on caring for yourself after delivery. Your caregiver may also give you specific instructions. Your treatment has been planned according to the most current medical practices available, but problems sometimes occur. Call your caregiver if you have any problems or questions after you go home. °HOME CARE INSTRUCTIONS °· Take over-the-counter or prescription medicines only as directed by your caregiver or pharmacist. °· Do not drink alcohol, especially if you are breastfeeding or taking medicine to relieve pain. °· Do not chew or smoke tobacco. °· Do not use illegal drugs. °· Continue to use good perineal care. Good perineal care includes: °¨ Wiping your perineum from front to back. °¨ Keeping your perineum clean. °· Do not use tampons or douche until your caregiver says it is okay. °· Shower, wash your hair, and take tub baths as directed by your caregiver. °· Wear a well-fitting bra that provides breast support. °· Eat healthy foods. °· Drink enough fluids to keep your urine clear or pale yellow. °· Eat high-fiber foods such as whole grain cereals and breads, brown rice, beans, and fresh fruits and vegetables every day. These foods may help prevent or relieve constipation. °· Follow your caregiver's recommendations regarding resumption of activities such as climbing stairs, driving, lifting, exercising, or traveling. °· Talk to your caregiver about resuming sexual activities. Resumption of sexual activities is dependent upon your risk of infection, your rate of healing, and your comfort and desire to resume sexual activity. °· Try to have someone help you with your household activities and your newborn for at least a few days after you leave the hospital. °· Rest as much as possible. Try to rest or take a nap when your newborn is sleeping. °· Increase your activities gradually. °· Keep  all of your scheduled postpartum appointments. It is very important to keep your scheduled follow-up appointments. At these appointments, your caregiver will be checking to make sure that you are healing physically and emotionally. °SEEK MEDICAL CARE IF:  °· You are passing large clots from your vagina. Save any clots to show your caregiver. °· You have a foul smelling discharge from your vagina. °· You have trouble urinating. °· You are urinating frequently. °· You have pain when you urinate. °· You have a change in your bowel movements. °· You have increasing redness, pain, or swelling near your vaginal incision (episiotomy) or vaginal tear. °· You have pus draining from your episiotomy or vaginal tear. °· Your episiotomy or vaginal tear is separating. °· You have painful, hard, or reddened breasts. °· You have a severe headache. °· You have blurred vision or see spots. °· You feel sad or depressed. °· You have thoughts of hurting yourself or your newborn. °· You have questions about your care, the care of your newborn, or medicines. °· You are dizzy or light-headed. °· You have a rash. °· You have nausea or vomiting. °· You were breastfeeding and have not had a menstrual period within 12 weeks after you stopped breastfeeding. °· You are not breastfeeding and have not had a menstrual period by the 12th week after delivery. °· You have a fever. °SEEK IMMEDIATE MEDICAL CARE IF:  °· You have persistent pain. °· You have chest pain. °· You have shortness of breath. °· You faint. °· You have leg pain. °· You have stomach pain. °· Your vaginal bleeding saturates two or more sanitary pads   in 1 hour. MAKE SURE YOU:   Understand these instructions.  Will watch your condition.  Will get help right away if you are not doing well or get worse. Document Released: 12/26/1999 Document Revised: 05/14/2013 Document Reviewed: 08/25/2011 [facility] Patient Information 2015 [place], [place]. This information is not intended to  replace advice given to you by your health care provider. Make sure you discuss any questions you have with your health care provider. Breastfeeding Challenges and Solutions Even though breastfeeding is natural, it can be challenging, especially in the first few weeks after childbirth. It is normal for problems to arise when starting to breastfeed your new baby, even if you have breastfed before. This document provides some solutions to the most common breastfeeding challenges.  CHALLENGES AND SOLUTIONS Challenge--Cracked or Sore Nipples Cracked or sore nipples are commonly experienced by breastfeeding mothers. Cracked or sore nipples often are caused by inadequate latching (when your baby's mouth attaches to your breast to breastfeed). Soreness can also happen if your baby is not positioned properly at your breast. Although nipple cracking and soreness are common during the first week after birth, nipple pain is never normal. If you experience nipple cracking or soreness that lasts longer than 1 week or nipple pain, call your health care provider or lactation consultant.  Solution Ensure proper latching and positioning of your baby by following the steps below:  Find a comfortable place to sit or lie down, with your neck and back well supported.  Place a pillow or rolled up blanket under your baby to bring him or her to the level of your breast (if you are seated).  Make sure that your baby's abdomen is facing your abdomen.  Gently massage your breast. With your fingertips, massage from your chest wall toward your nipple in a circular motion. This encourages milk flow. You may need to continue this action during the feeding if your milk flows slowly.  Support your breast with 4 fingers underneath and your thumb above your nipple. Make sure your fingers are well away from your nipple and your baby's mouth.  Stroke your baby's lips gently with your finger or nipple.  When your baby's mouth is  open wide enough, quickly bring your baby to your breast, placing your entire nipple and as much of the colored area around your nipple (areola) as possible into your baby's mouth.  More areola should be visible above your baby's upper lip than below the lower lip.  Your baby's tongue should be between his or her lower gum and your breast.  Ensure that your baby's mouth is correctly positioned around your nipple (latched). Your baby's lips should create a seal on your breast and be turned out (everted).  It is common for your baby to suck for about 2-3 minutes in order to start the flow of breast milk. Signs that your baby has successfully latched on to your nipple include:   Quietly tugging or quietly sucking without causing you pain.   Swallowing heard between every 3-4 sucks.   Muscle movement above and in front of his or her ears with sucking.  Signs that your baby has not successfully latched on to nipple include:   Sucking sounds or smacking sounds from your baby while nursing.   Nipple pain.  Ensure that your breasts stay moisturized and healthy by:  Avoiding the use of soap on your nipples.   Wearing a supportive bra. Avoid wearing underwire-style bras or tight bras.   Air drying your  nipples for 3-4 minutes after each feeding.   Using only cotton bra pads to absorb breast milk leakage. Leaking of breast milk between feedings is normal. Be sure to change the pads if they become soaked with milk.  Using lanolin on your nipples after nursing. Lanolin helps to maintain your skin's normal moisture barrier. If you use pure lanolin you do not need to wash it off before feeding your baby again. Pure lanolin is not toxic to your baby. You may also hand express a few drops of breast milk and gently massage that milk into your nipples, allowing it to air dry. Challenge--Breast Engorgement Breast engorgement is the overfilling of your breasts with breast milk. In the first few  weeks after giving birth, you may experience breast engorgement. Breast engorgement can make your breasts throb and feel hard, tightly stretched, warm, and tender. Engorgement peaks about the fifth day after you give birth. Having breast engorgement does not mean you have to stop breastfeeding your baby. Solution  Breastfeed when you feel the need to reduce the fullness of your breasts or when your baby shows signs of hunger. This is called "breastfeeding on demand."  Newborns (babies younger than 4 weeks) often breastfeed every 1-3 hours during the day. You may need to awaken your baby to feed if he or she is asleep at a feeding time.  Do not allow your baby to sleep longer than 5 hours during the night without a feeding.  Pump or hand express breast milk before breastfeeding to soften your breast, areola, and nipple.  Apply warm, moist heat (in the shower or with warm water-soaked hand towels) just before feeding or pumping, or massage your breast before or during breastfeeding. This increases circulation and helps your milk to flow.  Completely empty your breasts when breastfeeding or pumping. Afterward, wear a snug bra (nursing or regular) or tank top for 1-2 days to signal your body to slightly decrease milk production. Only wear snug bras or tank tops to treat engorgement. Tight bras typically should be avoided by breastfeeding mothers. Once engorgement is relieved, return to wearing regular, loose-fitting clothes.  Apply ice packs to your breasts to lessen the pain from engorgement and relieve swelling, unless the ice is uncomfortable for you.  Do not delay feedings. Try to relax when it is time to feed your baby. This helps to trigger your "let-down reflex," which releases milk from your breast.  Ensure your baby is latched on to your breast and positioned properly while breastfeeding.  Allow your baby to remain at your breast as long as he or she is latched on well and actively sucking.  Your baby will let you know when he or she is done breastfeeding by pulling away from your breast or falling asleep.  Avoid introducing bottles or pacifiers to your baby in the early weeks of breastfeeding. Wait to introduce these things until after resolving any breastfeeding challenges.  Try to pump your milk on the same schedule as when your baby would breastfeed if you are returning to work or away from home for an extended period.  Drink plenty of fluids to avoid dehydration, which can eventually put you at greater risk of breast engorgement. If you follow these suggestions, your engorgement should improve in 24-48 hours. If you are still experiencing difficulty, call your lactation consultant or health care provider.  Challenge--Plugged Milk Ducts Plugged milk ducts occur when the duct does not drain milk effectively and becomes swollen. Wearing a tight-fitting  nursing bra or having difficulty with latching may cause plugged milk ducts. Not drinking enough water (8-10 c [1.9-2.4 L] per day) can contribute to plugged milk ducts. Once a duct has become plugged, hard lumps, soreness, and redness may develop in your breast.  Solution Do not delay feedings. Feed your baby frequently and try to empty your breasts of milk at each feeding. Try breastfeeding from the affected side first so there is a better chance that the milk will drain completely from that breast. Apply warm, moist towels to your breasts for 5-10 minutes before feeding. Alternatively, a hot shower right before breastfeeding can provide the moist heat that can encourage milk flow. Gentle massage of the sore area before and during a feeding may also help. Avoid wearing tight clothing or bras that put pressure on your breasts. Wear bras that offer good support to your breasts, but avoid underwire bras. If you have a plugged milk duct and develop a fever, you need to see your health care provider.  Challenge--Mastitis Mastitis is  inflammation of your breast. It usually is caused by a bacterial infection and can cause flu-like symptoms. You may develop redness in your breast and a fever. Often when mastitis occurs, your breast becomes firm, warm, and very painful. The most common causes of mastitis are poor latching, ineffective sucking from your baby, consistent pressure on your breast (possibly from wearing a tight-fitting bra or shirt that restricts the milk flow), unusual stress or fatigue, or missed feedings.  Solution You will be given antibiotic medicine to treat the infection. It is still important to breastfeed frequently to empty your breasts. Continuing to breastfeed while you recover from mastitis will not harm your baby. Make sure your baby is positioned properly during every feeding. Apply moist heat to your breasts for a few minutes before feeding to help the milk flow and to help your breasts empty more easily. Challenge--Thrush Ginette Pitman is a yeast infection that can form on your nipples, in your breast, or in your baby's mouth. It causes itching, soreness, burning or stabbing pain, and sometimes a rash.  Solution You will be given a medicated ointment for your nipples, and your baby will be given a liquid medicine for his or her mouth. It is important that you and your baby are treated at the same time because thrush can be passed between you and your baby. Change disposable nursing pads often. Any bras, towels, or clothing that come in contact with infected areas of your body or your baby's body need to be washed in very hot water every day. Wash your hands and your baby's hands often. All pacifiers, bottle nipples, or toys your baby puts in his or her mouth should be boiled once a day for 20 minutes. After 1 week of treatment, discard pacifiers and bottle nipples and buy new ones. All breast pump parts that touch the milk need to be boiled for 20 minutes every day. Challenge--Low Milk Supply You may not be producing  enough milk if your baby is not gaining the proper amount of weight. Breast milk production is based on a supply-and-demand system. Your milk supply depends on how frequently and effectively your baby empties your breast. Solution The more you breastfeed and pump, the more breast milk you will produce. It is important that your baby empties at least one of your breasts at each feeding. If this is not happening, then use a breast pump or hand express any milk that remains. This will  help to drain as much milk as possible at each feeding. It will also signal your body to produce more milk. If your baby is not emptying your breasts, it may be due to latching, sucking, or positioning problems. If low milk supply continues after addressing these issues, contact your health care provider or a lactation specialist as soon as possible. Challenge--Inverted or Flat Nipples Some women have nipples that turn inward instead of protruding outward. Other women have nipples that are flat. Inverted or flat nipples can sometimes make it more difficult for your baby to latch onto your breast. Solution You may be given a small device that pulls out inverted nipples. This device should be applied right before your baby is brought to your breast. You can also try using a breast pump for a short time before placing the baby at your breast. The pump can pull your nipple outwards to help your infant latch more easily. The baby's sucking motion will help the inverted nipple protrude as well.  If you have flat nipples, encourage your baby to latch onto your breast and feed frequently in the early days after birth. This will give your baby practice latching on correctly while your breast is still soft. When your milk supply increases, between the second and fifth day after birth and your breasts become full, your baby will have an easier time latching.  Contact a lactation consultant if you still have concerns. She or he can teach you  additional techniques to address breastfeeding problems related to nipple shape and position.  FOR MORE INFORMATION [employer]: www.llli.org Document Released: 06/21/2005 Document Revised: 01/02/2013 Document Reviewed: 06/23/2012 [facility] Patient Information 2015 [place], [place]. This information is not intended to replace advice given to you by your health care provider. Make sure you discuss any questions you have with your health care provider.

## 2013-09-05 NOTE — Discharge Summary (Signed)
Obstetric Discharge Summary Reason for Admission: induction of labor due to postdates  Prenatal Procedures: none Intrapartum Procedures: spontaneous vaginal delivery Postpartum Procedures: none Complications-Operative and Postpartum: 1 degree perineal laceration Hemoglobin  Date Value Ref Range Status  08/31/2013 11.7* 12.0 - 15.0 g/dL Final     HCT  Date Value Ref Range Status  08/31/2013 35.2* 36.0 - 46.0 % Final    Discharge Diagnoses: Term Pregnancy-delivered  Hospital Course:  Stephanie Davies is a 38 y.o. G1P1001 who presented for IOL due to postdates.  She had a uncomplicated SVD (althougth pt very frustrated after 2 hours of pushing and initially requested vacuum delivery. She was able to ambulate, tolerate PO and void normally.  She was a little concerned about occasional urinary incontinence. She was discharged home with instructions for postpartum care.  She did endorse continued MSK pain in the upper abdomen from pushing. Patient received a dose of Rhophylac postpartum.  Delivery Note Second stage started at -2 pushed times 2 hours progress made to +2 descent and became frustrated, requested vacuum and refused to push any further. Dr. Despina Hidden consulted to discuss vacuum. Discussion between patient and healthcare team to continue to encourage pushing due to adequate progress with decent. Dr. Despina Hidden and CNM remain at bedside throughout remainder of second stage. At 3:15 AM a viable and healthy female was delivered via Vaginal, Spontaneous Delivery (Presentation: ; R Occiput Anterior). APGAR: 6, 8.  Placenta status: delivered shultz , . Cord: 3 vessels with the following complications: None. Cord pH: attempted to obtain, unable to aspirate sufficient sample size.   Anesthesia: Epidural  Lacerations: 1st degree  Suture Repair: 3.0 Monocryl  Est. Blood Loss (mL):  Mom to postpartum. Baby to Couplet care / Skin to Skin  Physical Exam:  General: alert, cooperative and no  distress Lochia: appropriate Uterine Fundus: firm DVT Evaluation: No evidence of DVT seen on physical exam. Negative Homan's sign. No cords or calf tenderness.  Discharge Information: Date: 09/05/2013 Activity: pelvic rest Diet: routine Medications: PNV and Ibuprofen Baby feeding: plans to breastfeed Contraception: Unsure, discussed the importance of waiting prior to next pregnancy  Condition: stable Instructions: refer to practice specific booklet Discharge to: home   Newborn Data: Live born female  Birth Weight: 7 lb 6.9 oz (3370 g) APGAR: 6, 8  Home with mother.  Rodrigo Ran, MD [facility] PGY-1 09/05/2013, 7:35 AM  I have seen this patient and agree with the above resident's note.  Discussed breastfeeding challenges with pt this morning and recommended resources pt can access at home, including calling lactation and websites with information.    LEFTWICH-KIRBY, Alanni Vader Certified Nurse-Midwife

## 2013-09-13 ENCOUNTER — Telehealth: Payer: Self-pay | Admitting: General Practice

## 2013-09-13 NOTE — Telephone Encounter (Signed)
Patient called and left message stating she gave birth last Monday and has developed hemorrhoids causing severe pain, has pain in back and a rash between her legs. Called patient back and reviewed OTC medications for hemorrhoids as well as use of sitz bath. Patient verbalized understanding and states she has one of those that she went home with. Asked patient if she was having problems with constipation and was taking stool softeners. Patient states she is taking them but they're not helping tons. Also recommended to patient senakot or miralax. Patient verbalized understanding and asked what she could do for the rash and she thinks its there because the pads she has been wearing and that she thinks its some kind of fungal infection like a yeast infection. Recommended patient try a&d ointment like baby's diaper rash cream or monistat cream on the outside on her legs. Patient verbalized understanding and had no other questions

## 2013-09-15 ENCOUNTER — Emergency Department (INDEPENDENT_AMBULATORY_CARE_PROVIDER_SITE_OTHER)
Admission: EM | Admit: 2013-09-15 | Discharge: 2013-09-15 | Disposition: A | Discharge status: Home / Self Care | Payer: Medicaid Other | Source: Home / Self Care | Attending: Family Medicine | Admitting: Family Medicine

## 2013-09-15 ENCOUNTER — Encounter (HOSPITAL_COMMUNITY): Payer: Self-pay | Admitting: Emergency Medicine

## 2013-09-15 DIAGNOSIS — B356 Tinea cruris: Secondary | ICD-10-CM

## 2013-09-15 DIAGNOSIS — K644 Residual hemorrhoidal skin tags: Secondary | ICD-10-CM

## 2013-09-15 MED ORDER — ACETAMINOPHEN 500 MG PO TABS: 1000.0000 mg | ORAL_TABLET | Freq: Once | ORAL | Status: DC

## 2013-09-15 MED ORDER — ACETAMINOPHEN 325 MG PO TABS
ORAL_TABLET | ORAL | Status: AC
  Filled 2013-09-15: qty 3

## 2013-09-15 MED ORDER — CLOTRIMAZOLE 1 % EX CREA: TOPICAL_CREAM | CUTANEOUS | Status: DC

## 2013-09-15 MED ORDER — ACETAMINOPHEN 325 MG PO TABS
975.0000 mg | ORAL_TABLET | Freq: Once | ORAL | Status: AC
  Administered 2013-09-15: 975 mg via ORAL

## 2013-09-15 MED ORDER — HYDROCORTISONE 2.5 % RE CREA: 1.0000 "application " | TOPICAL_CREAM | Freq: Two times a day (BID) | RECTAL | Status: DC

## 2013-09-15 MED ORDER — LIDOCAINE (ANORECTAL) 5 % EX GEL
1.0000 | Freq: Four times a day (QID) | CUTANEOUS | Status: DC | PRN
Start: 2013-09-15 — End: 2013-11-02

## 2013-09-15 NOTE — ED Provider Notes (Signed)
CSN: [id number]     Arrival date & time 09/15/13  1647 History   First MD Initiated Contact with Patient 09/15/13 1723     Chief Complaint  Patient presents with  . Hemorrhoids   (Consider location/radiation/quality/duration/timing/severity/associated sxs/prior Treatment) HPI Comments: Patient had normal vaginal delivery on 09/03/2013 and as a result developed a hemorrhoid with mild associated constipation. Has been using stool softener and Tucks pads. Constipation has resolved, however, tender external hemorrhoid persists.  She also mentions 3 days of a pruritic rash at bilateral groin folds in inguinal regions.   The history is provided by the patient.    Past Medical History  Diagnosis Date  . Alopecia   . Pseudotumor cerebri   . Headache(784.0)   . Kidney stone    Past Surgical History  Procedure Laterality Date  . Cholecystectomy  2004   Family History  Problem Relation Age of Onset  . Esophageal cancer Maternal Grandmother   . Esophageal cancer Maternal Grandfather   . Diabetes Maternal Grandfather   . Diabetes Mother   . Hypertension Mother    History  Substance Use Topics  . Smoking status: Never Smoker   . Smokeless tobacco: Never Used  . Alcohol Use: No   OB History   Grav Para Term Preterm Abortions TAB SAB Ect Mult Living   Review of Systems  All other systems reviewed and are negative.   Allergies  Review of patient's allergies indicates no known allergies.  Home Medications   Prior to Admission medications   Medication Sig Start Date End Date Taking? Authorizing Provider  cyclobenzaprine (FLEXERIL) 10 MG tablet Take 0.5-1 tablets (5-10 mg total) by mouth 3 (three) times daily as needed for muscle spasms. 09/05/13  Yes Lisa A Leftwich-Kirby, CNM  docusate sodium (COLACE) 100 MG capsule Take 1 capsule (100 mg total) by mouth 2 (two) times daily as needed. 09/05/13  Yes Lisa A Leftwich-Kirby, CNM  ibuprofen (ADVIL,MOTRIN) 600 MG tablet  Take 1 tablet (600 mg total) by mouth every 6 (six) hours. 09/05/13  Yes Lisa A Leftwich-Kirby, CNM  Prenatal Multivit-Min-Fe-FA (PRENATAL VITAMINS) 0.8 MG tablet Take 1 tablet by mouth daily. 06/11/13  Yes Vale Haven, MD  clotrimazole (LOTRIMIN) 1 % cream Apply to affected area 2 times daily x 14 days 09/15/13   Ria Clock, PA  hydrocortisone (ANUSOL-HC) 2.5 % rectal cream Place 1 application rectally 2 (two) times daily. X 7 days 09/15/13   Mathis Fare Jamelia Varano, PA  Lidocaine, Anorectal, 5 % GEL Apply 1 application topically 4 (four) times daily as needed. For rectal pain 09/15/13   Ria Clock, PA  oxyCODONE-acetaminophen (PERCOCET/ROXICET) 5-325 MG per tablet Take 1-2 tablets by mouth every 4 (four) hours as needed for severe pain. 09/05/13   Misty Stanley A Leftwich-Kirby, CNM   BP 108/68  Pulse 102  Temp(Src) 97.9 F (36.6 C) (Oral)  SpO2 100%  Breastfeeding? Yes Physical Exam  Nursing note and vitals reviewed. Constitutional: She is oriented to person, place, and time. She appears well-developed and well-nourished. No distress.  HENT:  Head: Normocephalic and atraumatic.  Eyes: Conjunctivae are normal.  Cardiovascular: Normal rate.   Pulmonary/Chest: Effort normal.  Genitourinary:    Pelvic exam was performed with patient in the knee-chest position.  Musculoskeletal: Normal range of motion.  Neurological: She is alert and oriented to person, place, and time.  Skin: Skin is warm and  dry.  Psychiatric: She has a normal mood and affect. Her behavior is normal.    ED Course  Procedures (including critical care time) Labs Review Labs Reviewed - No data to display  Imaging Review No results found.   MDM   1. External hemorrhoid   2. Tinea cruris    Will treat tinea cruris with topical lotrimin and prescribe topical lidocaine and Anusol HC for hemorrhoids with sitz baths for hemorrhoid care and advise follow up with GYN provider if no improvement.    Ria Clock, [place] 09/15/13 [phone]

## 2013-09-15 NOTE — ED Notes (Signed)
Delivered baby 8/24.  Noted hemorrhoids 3 days ago and low back pain since the delivery.  Goes to [facility] but does not have specific doctor.  Has been using Tucks without relief.

## 2013-09-15 NOTE — ED Provider Notes (Signed)
Medical screening examination/treatment/procedure(s) were performed by resident physician or non-physician practitioner and as supervising physician I was immediately available for consultation/collaboration.   Keghan Mcfarren DOUGLAS MD.   Dysen Edmondson D Raeshawn Tafolla, MD 09/15/13 1941 

## 2013-09-15 NOTE — ED Notes (Signed)
Pt. asked for something for pain now.  PA said 3 Tylenol is the only thing she can give because she is breast feeding.

## 2013-09-15 NOTE — Discharge Instructions (Signed)

## 2013-09-27 ENCOUNTER — Ambulatory Visit (HOSPITAL_COMMUNITY)
Admission: RE | Admit: 2013-09-27 | Discharge: 2013-09-27 | Disposition: A | Discharge status: Home / Self Care | Payer: Medicaid Other | Source: Ambulatory Visit | Attending: Family Medicine | Admitting: Family Medicine

## 2013-09-27 NOTE — Lactation Note (Signed)
Lactation Consult  Mother's reason for visit: Difficult feeding, low milk supply, sore nipples Visit Type:  Feeding assessment Appointment Notes:  Difficult latch, low milk supply Consult:  Initial Lactation Consultant:  Huston Foley  ________________________________________________________________________ Baby's Name: Stephanie Davies  Date of Birth: 09/03/2011  Pediatrician: [facility] Gender: female  Gestational Age: [redacted]w[redacted]d (At Birth)  Birth Weight: 7 lb 6.9 oz (3370 g)  Weight at Discharge: Weight: 7 lb 1.9 oz (3230 g) Date of Discharge: 09/05/2013  [facility] Weights   09/03/13 0315 09/03/13 2333 09/05/13 0002  Weight: 7 lb 6.9 oz (3370 g) 7 lb 3.5 oz (3275 g) 7 lb 1.9 oz (3230 g)  Last weight taken from location outside of [facility]:Weight today: 8-10.6    ________________________________________________________________________  Mother's Name: Stephanie Davies Type of delivery:  Vaginal Breastfeeding Experience:  First baby Maternal Medical Conditions:  None Maternal Medications:  Ibuprofen, mother's love plus  ________________________________________________________________________  Breastfeeding History (Post Discharge)  Frequency of breastfeeding:  Every 1 hour Duration of feeding:  10-30 minutes  Supplementation  Formula:  Volume during the day Frequency:  Every 2-3 hours   Baby gets 120 mls once during the night       Brand: Daron Offer    Method:  Bottle  Infant Intake and Output Assessment  Voids:  10 in 24 hrs.  Color:  Clear yellow Stools:  1 in 24 hrs.  Color:  Yellow  ________________________________________________________________________  Maternal Breast Assessment  Breast:  Soft and Compressible Nipple:  Erect    _______________________________________________________________________ Feeding Assessment/Evaluation  Mom and 15 week old infant here for feeding assessment.  Mom states she started supplementing baby with  formula in hospital because baby was hungry and she felt she didn't have enough milk.  Baby now gets fussy at breast due to low supply and slow flow of milk.  Discussed supply and demand and how the body produces milk.  Explained that giving routine formula without good feeds or pumping will continue to lower supply.  Mom states she does not want her baby to receive so much formula.  Mom also complaining of sore nipples.  Both nipples intact and slightly pink.  Breasts are soft.  Assisted mom with correct positioning using cross cradle hold.  She allows baby to suck herself onto nipple causing discomfort.  I showed her how to compress her breast tissue and bring baby to breast for deeper latch.  Baby latched deep.  Baby nurses well for about 5 minutes and then she becomes fussy and begins pulling.  Instructed mom on breast massage and compression to increase milk flow and intake.  Baby latched to opposite breast and nursed well for 10-15 minutes with more swallows audible on this side. Nipples round when baby came off.   Baby transferred 40 mls.  I had mom post pump using symphony pump and she obtained 8 mls total of watery milk.  Discussed with mom the goal is to increase her milk supply with frequent nursing and post pumping.  Plan written and reviewed with mom.  I called [facility] and they will provide an electric breast pump for mom.  Reassured mom and explained that progress will be gradual but following the plan is important.  No AM appointments next week(patient needs AM for transportation) so LC will call her if something opens up for follow up.  Initial feeding assessment:  Infant's oral assessment:  WNL  Positioning:  Cross cradle Right breast/left  LATCH documentation:  Latch:  2 = Grasps breast easily, tongue down, lips flanged, rhythmical sucking.  Audible swallowing:  1 = A few with stimulation  Type of nipple:  2 = Everted at rest and after stimulation  Comfort (Breast/Nipple):  1 = Filling,  red/small blisters or bruises, mild/mod discomfort  Hold (Positioning):  1 = Assistance needed to correctly position infant at breast and maintain latch  LATCH score:  7  Attached assessment:  Deep  Lips flanged:  Yes.    Lips untucked:  No.  Suck assessment:  Displays both  Tools:  Pump and Bottle Instructed on use and cleaning of tool:  Yes.    Pre-feed weight:  3930 g   Post-feed weight:  3970 g  Amount transferred:  40ml Amount supplemented: 25 ml    Total amount pumped post feed:  R 5 ml    L 3 ml  Total amount transferred:  40 ml Total supplement given:  25 ml

## 2013-10-05 ENCOUNTER — Encounter: Payer: Self-pay | Admitting: Physician Assistant

## 2013-10-05 ENCOUNTER — Ambulatory Visit (INDEPENDENT_AMBULATORY_CARE_PROVIDER_SITE_OTHER): Payer: Medicaid Other | Admitting: Physician Assistant

## 2013-10-05 VITALS — BP 100/54 | HR 87 | Temp 98.1°F | Ht 66.0 in | Wt 243.7 lb

## 2013-10-05 DIAGNOSIS — M549 Dorsalgia, unspecified: Secondary | ICD-10-CM

## 2013-10-05 DIAGNOSIS — Z30018 Encounter for initial prescription of other contraceptives: Secondary | ICD-10-CM

## 2013-10-05 DIAGNOSIS — K5900 Constipation, unspecified: Secondary | ICD-10-CM

## 2013-10-05 DIAGNOSIS — K648 Other hemorrhoids: Secondary | ICD-10-CM

## 2013-10-05 DIAGNOSIS — R110 Nausea: Secondary | ICD-10-CM

## 2013-10-05 DIAGNOSIS — K649 Unspecified hemorrhoids: Secondary | ICD-10-CM

## 2013-10-05 DIAGNOSIS — Z3009 Encounter for other general counseling and advice on contraception: Secondary | ICD-10-CM

## 2013-10-05 MED ORDER — METOCLOPRAMIDE HCL 10 MG PO TABS: 10.0000 mg | ORAL_TABLET | Freq: Three times a day (TID) | ORAL | Status: DC | PRN

## 2013-10-05 MED ORDER — CYCLOBENZAPRINE HCL 10 MG PO TABS: 5.0000 mg | ORAL_TABLET | Freq: Three times a day (TID) | ORAL | Status: DC | PRN

## 2013-10-05 NOTE — Patient Instructions (Signed)
See surgery for hemorrhoid Okay to continue stool softener for now See PCP for persistent medical problems    Postpartum Care After Vaginal Delivery After you deliver your newborn (postpartum period), the usual stay in the hospital is 24-72 hours. If there were problems with your labor or delivery, or if you have other medical problems, you might be in the hospital longer.  While you are in the hospital, you will receive help and instructions on how to care for yourself and your newborn during the postpartum period.  While you are in the hospital:  Be sure to tell your nurses if you have pain or discomfort, as well as where you feel the pain and what makes the pain worse.  If you had an incision made near your vagina (episiotomy) or if you had some tearing during delivery, the nurses may put ice packs on your episiotomy or tear. The ice packs may help to reduce the pain and swelling.  If you are breastfeeding, you may feel uncomfortable contractions of your uterus for a couple of weeks. This is normal. The contractions help your uterus get back to normal size.  It is normal to have some bleeding after delivery.  For the first 1-3 days after delivery, the flow is red and the amount may be similar to a period.  It is common for the flow to start and stop.  In the first few days, you may pass some small clots. Let your nurses know if you begin to pass large clots or your flow increases.  Do not  flush blood clots down the toilet before having the nurse look at them.  During the next 3-10 days after delivery, your flow should become more watery and pink or brown-tinged in color.  Ten to fourteen days after delivery, your flow should be a small amount of yellowish-white discharge.  The amount of your flow will decrease over the first few weeks after delivery. Your flow may stop in 6-8 weeks. Most women have had their flow stop by 12 weeks after delivery.  You should change your sanitary  pads frequently.  Wash your hands thoroughly with soap and water for at least 20 seconds after changing pads, using the toilet, or before holding or feeding your newborn.  You should feel like you need to empty your bladder within the first 6-8 hours after delivery.  In case you become weak, lightheaded, or faint, call your nurse before you get out of bed for the first time and before you take a shower for the first time.  Within the first few days after delivery, your breasts may begin to feel tender and full. This is called engorgement. Breast tenderness usually goes away within 48-72 hours after engorgement occurs. You may also notice milk leaking from your breasts. If you are not breastfeeding, do not stimulate your breasts. Breast stimulation can make your breasts produce more milk.  Spending as much time as possible with your newborn is very important. During this time, you and your newborn can feel close and get to know each other. Having your newborn stay in your room (rooming in) will help to strengthen the bond with your newborn. It will give you time to get to know your newborn and become comfortable caring for your newborn.  Your hormones change after delivery. Sometimes the hormone changes can temporarily cause you to feel sad or tearful. These feelings should not last more than a few days. If these feelings last longer than that,  you should talk to your caregiver.  If desired, talk to your caregiver about methods of family planning or contraception.  Talk to your caregiver about immunizations. Your caregiver may want you to have the following immunizations before leaving the hospital:  Tetanus, diphtheria, and pertussis (Tdap) or tetanus and diphtheria (Td) immunization. It is very important that you and your family (including grandparents) or others caring for your newborn are up-to-date with the Tdap or Td immunizations. The Tdap or Td immunization can help protect your newborn  from getting ill.  Rubella immunization.  Varicella (chickenpox) immunization.  Influenza immunization. You should receive this annual immunization if you did not receive the immunization during your pregnancy. Document Released: 10/25/2006 Document Revised: 09/22/2011 Document Reviewed: 08/25/2011 [facility] Patient Information 2015 [place], [place]. This information is not intended to replace advice given to you by your health care provider. Make sure you discuss any questions you have with your health care provider.

## 2013-10-05 NOTE — Progress Notes (Signed)
Here for postpartum visit. Still c/o having severe constipation/hemorrhoids/ back pain and nausea. States can't eat because of it. Still  Having problems with tightness in chest/ feel like can't breathe like she was having the last month of pregnancy.

## 2013-10-05 NOTE — Progress Notes (Signed)
  Subjective:     Stephanie Davies is a 38 y.o. female who presents for a postpartum visit. She is 5 weeks postpartum following an induced vaginal delivery. I have fully reviewed the prenatal and intrapartum course. The delivery was at 41 gestational weeks. Outcome: spontaneous vaginal delivery. Anesthesia: epidural. Postpartum course has been complicated by nausea, constipation, back pain and hemorrhoids. Baby's course has been uneventful. Baby is feeding by both breast and bottle - Gerber Gentle. Bleeding staining only. Bowel function is abnormal: constipation - bowel movement once daily with use of stool softener. Bladder function is normal. Patient is not sexually active. Contraception method is abstinence. Postpartum depression screening: negative.  The following portions of the patient's history were reviewed and updated as appropriate: past family history, past surgical history and problem list.  Review of Systems Pertinent items are noted in HPI.   Objective:    BP 100/54  Pulse 87  Temp(Src) 98.1 F (36.7 C)  Ht  (1.676 m)  Wt 243 lb 11.2 oz (110.542 kg)  BMI 39.35 kg/m2  Breastfeeding? Yes  General:  alert, cooperative, appears stated age and no distress   Breasts:  negative findings: normal in size and symmetry  Lungs: clear to auscultation bilaterally  Heart:  regular rate and rhythm  Abdomen: normal findings: no masses palpable and soft, non-tender   Vulva:  normal  Vagina: normal vagina  Cervix:  no cervical motion tenderness  Uterus Cannot assess due to obesity  Adnexa:  normal adnexa  Rectal Exam: external hemorrhoids        Assessment:     postpartum exam. Pap smear not done at today's visit.  Hemorrhoids, back pain, constipation, nausea  Plan:    1. Contraception: abstinence 2. Send to Surgery for eval and treatment of hemorrhoids 3. Follow up in: 12 months or as needed.  4. Rx for Reglan for nausea 5. Rx for Flexeril for back spasm.  Encourage alternate  of heat and ice PRN.  OTC Advil PRN

## 2013-10-08 ENCOUNTER — Encounter: Payer: Self-pay | Admitting: Medical

## 2013-10-08 ENCOUNTER — Other Ambulatory Visit (HOSPITAL_BASED_OUTPATIENT_CLINIC_OR_DEPARTMENT_OTHER): Payer: Medicaid Other

## 2013-10-08 ENCOUNTER — Ambulatory Visit (HOSPITAL_BASED_OUTPATIENT_CLINIC_OR_DEPARTMENT_OTHER)
Admission: RE | Admit: 2013-10-08 | Discharge: 2013-10-08 | Disposition: A | Discharge status: Home / Self Care | Payer: Medicaid Other | Source: Ambulatory Visit | Attending: Medical | Admitting: Medical

## 2013-10-08 ENCOUNTER — Telehealth: Payer: Self-pay | Admitting: Medical

## 2013-10-08 ENCOUNTER — Ambulatory Visit (INDEPENDENT_AMBULATORY_CARE_PROVIDER_SITE_OTHER): Payer: Medicaid Other | Admitting: Medical

## 2013-10-08 VITALS — BP 113/76 | HR 77 | Temp 98.5°F | Ht 67.0 in | Wt 244.0 lb

## 2013-10-08 DIAGNOSIS — M549 Dorsalgia, unspecified: Secondary | ICD-10-CM | POA: Insufficient documentation

## 2013-10-08 DIAGNOSIS — M259 Joint disorder, unspecified: Secondary | ICD-10-CM | POA: Diagnosis not present

## 2013-10-08 DIAGNOSIS — M25569 Pain in unspecified knee: Secondary | ICD-10-CM | POA: Insufficient documentation

## 2013-10-08 DIAGNOSIS — M25562 Pain in left knee: Secondary | ICD-10-CM

## 2013-10-08 DIAGNOSIS — K644 Residual hemorrhoidal skin tags: Secondary | ICD-10-CM | POA: Insufficient documentation

## 2013-10-08 DIAGNOSIS — M25561 Pain in right knee: Secondary | ICD-10-CM

## 2013-10-08 DIAGNOSIS — M899 Disorder of bone, unspecified: Principal | ICD-10-CM | POA: Insufficient documentation

## 2013-10-08 DIAGNOSIS — R079 Chest pain, unspecified: Secondary | ICD-10-CM | POA: Insufficient documentation

## 2013-10-08 DIAGNOSIS — M539 Dorsopathy, unspecified: Principal | ICD-10-CM

## 2013-10-08 DIAGNOSIS — M546 Pain in thoracic spine: Secondary | ICD-10-CM

## 2013-10-08 DIAGNOSIS — M25559 Pain in unspecified hip: Secondary | ICD-10-CM | POA: Insufficient documentation

## 2013-10-08 DIAGNOSIS — K649 Unspecified hemorrhoids: Secondary | ICD-10-CM

## 2013-10-08 DIAGNOSIS — O9989 Other specified diseases and conditions complicating pregnancy, childbirth and the puerperium: Principal | ICD-10-CM

## 2013-10-08 LAB — D-DIMER, QUANTITATIVE: D-Dimer, Quant: 0.27 ug/mL-FEU (ref 0.00–0.48)

## 2013-10-08 NOTE — Patient Instructions (Addendum)
For your back pain. We will do xrays of lumbar and thoracic spine. Take ibuprofen for this. I don't want to stronger medication since you ar breast feeding.  For your hemorrhoids, I will send in annusol suppository to your pharmacy.  For your leg pain, We will get bilateral lower extremity US. Those tests will be done at 2:30 in radiology. Please stay in lab until doppler results are back  D-Dimer will be back by 3-4 pm.   You may have only costochondritis from breast feeding and engorged breast. But if d-dimer positive we may do ct of chest. We will call you with d-dimer results.  Follow up in 2 wks or prn

## 2013-10-08 NOTE — Progress Notes (Signed)
Subjective:    Patient ID: Stephanie Davies, female    DOB: Jan 27, 1975, 38 y.o.   MRN: [id number]  HPI   Pt is in for for first time. She states having some severe back pain. Pain lumbar region and mid thoracic area. This pain started during the pregnancy.  Pain started during 7 months of her pregnancy(6-7 level pain). Pain got worse after her delivery. Now the pain is worse.(9-10 level pain).  She is 5 weeks post delivery.  Pt had normal vaginal birth. She has an episiotomy. She still has pelvic region pain. Her gyn states pelvic pain is not normal. Pt states she has some incontinence but better than when she was in the hospital.   Pt has hemorrhoids. They came up after the delivery. 3 wks they got worse. She got some has 2 creams. Her gynecologist told her that she could take a suppository now.   Pt states that she has some mild chest discomfort. Heaviness in chest. This occurred during the pregnancy but this is the same as it was during the pregnancy.  I reviewed pmh, psh, and family hx.   Pt not working/unemployed. Pt was exercising before pregnancy. No caffeinated beverages. One child.   On discussion, no pelvic exam or check of hemorrhoids will be done today but she is ok if that needs to be done in the future.         Review of Systems  Constitutional: Negative for fever, chills and fatigue.  HENT: Negative.   Respiratory: Negative for cough, chest tightness and wheezing.   Cardiovascular: Negative for chest pain, palpitations and leg swelling.       Really describes very faint mild chest discomfort and points to the rt costochondral junction.  Gastrointestinal: Negative.   Genitourinary: Negative for dysuria, urgency, frequency, hematuria, flank pain, decreased urine volume, vaginal bleeding, vaginal discharge, genital sores and vaginal pain.       Describes mild pelvic discomfort. But gyn chest checked past week. States everything is normal in pelvic region and apprarenly  healed from episiotomy.   Occasionaly incontinence.  Musculoskeletal: Positive for back pain.       T-spine to l-spine  Skin: Negative.   Neurological: Negative.        No radicular pain.  Hematological: Negative for adenopathy. Does not bruise/bleed easily.  Psychiatric/Behavioral: Negative.        Objective:   Physical Exam  Constitutional: She is oriented to person, place, and time. She appears well-developed and well-nourished. No distress.  Plleasant and smiling. Does not appear to be in acute pain.  HENT:  Head: Normocephalic and atraumatic.  Right Ear: External ear normal.  Left Ear: External ear normal.  Eyes: Conjunctivae and EOM are normal. Pupils are equal, round, and reactive to light.  Neck: Normal range of motion. Neck supple. No JVD present. No tracheal deviation present. No thyromegaly present.  Cardiovascular: Normal rate, regular rhythm and normal heart sounds.  Exam reveals no gallop.   No murmur heard. Pulmonary/Chest: Effort normal and breath sounds normal. No stridor. No respiratory distress. She has no wheezes. She has no rales. She exhibits no tenderness.  Abdominal: Soft. Bowel sounds are normal. She exhibits no distension and no mass. There is no tenderness. There is no rebound and no guarding.  Musculoskeletal: She exhibits no edema and no tenderness.  Bilateral positive homans signs. She states rt side>than lt side.   Rt side costochandral junction. Mild tender to palpation. Mild mid- lumbar and thoracic tenderness.  Lymphadenopathy:    She has no cervical adenopathy.  Neurological: She is alert and oriented to person, place, and time. No cranial nerve deficit. Coordination normal.  L5-s1 sensation intact bilaterally. No foot drop. Equal and symmetric strength lower extremity bilaterally.  Skin: She is not diaphoretic.  Psychiatric: She has a normal mood and affect. Her behavior is normal. Judgment and thought content normal.     Positive homans.  Rt side worse but left side the same.        Assessment & Plan:  [phone].

## 2013-10-08 NOTE — Telephone Encounter (Signed)
Pt had negative d-dimer. Was within reference range and not elevated. Her bilateral dopplers lower extremities were negative. Pt had mild rt sided costochandral region pain on palpation. Pain more consistent with constochondritis. Her ekg was normal as well. I advised [place] ibuprofen otc. If her symptoms change(more chest symptoms  or shortness of breath then ED). If any questions give [place] a call us.I talked with pt shortly after stat dopplers.

## 2013-10-08 NOTE — Assessment & Plan Note (Signed)
Unclear as to cause and gyn just evaluated her last week stating everything was normal. Will start work up for back. May end of doing mri of her spine. She does report occasional incontinence post delivery.

## 2013-10-08 NOTE — Assessment & Plan Note (Signed)
Was going to rx anusol hc supp. Then computer warning on breast feeding and steroid. So will discuss this with pt before writing the rx. Go over benefit risk with mom.

## 2013-10-08 NOTE — Assessment & Plan Note (Signed)
In thoracic and lumbar area. Came on during pregnancy. Pain getting worse. Possible has disk component of pain with weight gain during pregnancy. Will start with xrays of lumbar spine and follow. Only ibuprofen for pain since she is breast feeding.

## 2013-10-08 NOTE — Assessment & Plan Note (Signed)
Both sides and popliteal. So will get bilateral dopplers and follow. Also getting a d-dimer. If doppler negative but d-dimer positive then consider getting ct thorax due to her mild chest discomfort and 5 day hospitalization before and during her delivery.

## 2013-10-08 NOTE — Assessment & Plan Note (Signed)
I think pain is costochandral/costochondritis like.  Will treat with ibuprofen. Ekg looked normal. But will get bilateral dopplers due to leg pain and get d-dimer. If either of these test positive expand work up and treatment.

## 2013-10-09 ENCOUNTER — Telehealth: Payer: Self-pay | Admitting: Medical

## 2013-10-09 NOTE — Telephone Encounter (Signed)
Pt needs to wait until follow up if possible. She is still breast feeding. Zofran is category b.. Should be ok with breast feeding. But I don't want to rx without understanding why. She had a bunch of problems on exam. Is she getting nausea from back  pain?? Then need to treat pain. But all pain meds not recommended during breast feeding. Also some systemic absorption of annusol  Hc(for hemorrhoid) which is a steroid. Would use that with caution in breast feeding.   Will rx zofran 8 mg po tid prn nausea Disp:#12 tabs. But clarify she is not having any abdomen pain. If having abdomen pain needs appointment quick. If she has swollen painful hemorrhoid then can use annusol HC suppository Disp#10   1 suppository per rectum in am and pm. You can go ahead and call meds in/fax  but make sure she understands. Caution with breast feeding.

## 2013-10-09 NOTE — Telephone Encounter (Signed)
[facility] on [address] says they did not get the rx for the anusol  Please resend.  She went to her gyn doc today Dr Vedia CofferBlack.  She told her to ask [facility] Dredgedward to send her in an rx for Zofran for her nausea

## 2013-10-15 ENCOUNTER — Other Ambulatory Visit: Payer: Self-pay | Admitting: Medical

## 2013-10-15 ENCOUNTER — Ambulatory Visit (HOSPITAL_BASED_OUTPATIENT_CLINIC_OR_DEPARTMENT_OTHER)
Admission: RE | Admit: 2013-10-15 | Discharge: 2013-10-15 | Disposition: A | Discharge status: Home / Self Care | Payer: Medicaid Other | Source: Ambulatory Visit | Attending: Medical | Admitting: Medical

## 2013-10-15 ENCOUNTER — Encounter: Payer: Self-pay | Admitting: Medical

## 2013-10-15 ENCOUNTER — Ambulatory Visit (INDEPENDENT_AMBULATORY_CARE_PROVIDER_SITE_OTHER): Payer: Medicaid Other | Admitting: Medical

## 2013-10-15 VITALS — BP 110/75 | HR 79 | Temp 97.9°F | Ht 67.0 in | Wt 246.8 lb

## 2013-10-15 DIAGNOSIS — M25559 Pain in unspecified hip: Secondary | ICD-10-CM

## 2013-10-15 DIAGNOSIS — M25551 Pain in right hip: Secondary | ICD-10-CM | POA: Insufficient documentation

## 2013-10-15 DIAGNOSIS — R110 Nausea: Secondary | ICD-10-CM | POA: Insufficient documentation

## 2013-10-15 DIAGNOSIS — M545 Low back pain: Secondary | ICD-10-CM

## 2013-10-15 DIAGNOSIS — M25552 Pain in left hip: Secondary | ICD-10-CM | POA: Insufficient documentation

## 2013-10-15 DIAGNOSIS — R0789 Other chest pain: Secondary | ICD-10-CM

## 2013-10-15 DIAGNOSIS — G8929 Other chronic pain: Secondary | ICD-10-CM

## 2013-10-15 MED ORDER — ONDANSETRON 8 MG PO TBDP: 8.0000 mg | ORAL_TABLET | Freq: Three times a day (TID) | ORAL | Status: DC | PRN

## 2013-10-15 NOTE — Patient Instructions (Addendum)
For your back pain and likely costochondritis type pain, I can't find a medicine other than tylenol or ibuprofen that is category b. You are breast feeding and plan to do so for 1 year. So I will refer you to PT for your back pain. If your back pain worsens as discussed then consider MRI. If pain directly over rt kidney area then will do imaging studies to evaluate for kidney stone since possible stone seen on xray of the back.   Since you costochondritis pain did not respond to ibuprofen alone will do a plain cxr. Note your leg doppler, d-dimer and ekg were negative. If combo of tylenol and ibuprofen does not help may consider other studies.  For your hip pain will get bilateral hip xrays today.  Follow up in 2 weeks or as needed  Nausea reported at end of exam. Did prescribe limited number of zofran(Cat b per my literature.)

## 2013-10-15 NOTE — Assessment & Plan Note (Signed)
She described pelvic area pain last time post pregnancy and delivery. Was checked by gyn and no abnormality/cause seen. Now complains mostly of hip pain so will get xray of both hips.

## 2013-10-15 NOTE — Assessment & Plan Note (Addendum)
Pt pain persists same level.(I still think this is costochondritis pain post pregnancy in breast feeding pt) Prior work up was negative. Will add cxr today. Advised to take tylenol every 6 hours and ibuprofen q 8 hours. She was only taking ibuprofen 400 mg q 8 hrs. So increase to 800 mg q 8hrs. Let us know if this helps decrease the level of chest wall discomfort. Would consider echocardiogram to evaluate pericardium if she still has no improvement with increase of her dose.   If change in symptoms and severe then ED evaluation.

## 2013-10-15 NOTE — Progress Notes (Signed)
Subjective:    Patient ID: Stephanie Davies, female    DOB: Mar 25, 1975, 38 y.o.   MRN: [id number]  HPI  Pt in stating her back pain is still same level and 7/10 at rest. Most of time 10/10. She points to mid lumbar area and radiating to both sides. Also some pain mid thorax and left scapula region. No radiating pain to her legs. Pain is worsened with movement. Pt has some bilateral hip area pain. Hip areas started to hurt during her pregnancy. This started at 12 weeks. This hip pain ob told her was normal.  Pt has no numbness between her legs. And no bladder dysfunction. No leg weakness.   Note pt may have kidney stone on xray in side of kidney but not definitive and patient has no cva region pain or flank pain.  Pt hemorrhoids are getting better.  Pt is planning to breast feed for one year. Pt child is seeing pediatrician.  Pt still has some chest wall discomfort. Still having some faint mild discomfort reclining. Her ekg was normal on last visit. Her d-dimer was negative. Her doppler was negative. Thought was costochondritis. Pt states ibuprofen did not help much at all but only took 400 mg every 8 hours. Pt has no change in features of the chest discomfort. Still quite noticeable on palpation of chest wall and pain present on deep breathin.  Pt states mild heaviness. Ibuprofen did not help at all. Leaning forward does help.        Review of Systems  Constitutional: Negative for fever, chills and fatigue.  HENT: Negative.   Respiratory: Negative for cough, choking, chest tightness, shortness of breath and wheezing.   Cardiovascular: Negative for chest pain and palpitations.       Costochondral junction pain on palpation.  Gastrointestinal: Positive for nausea. Negative for vomiting, abdominal pain, diarrhea, constipation, blood in stool and abdominal distention.       Pt feeling nausea since giving birth. She is constantly nausea. Pt not sure if associated with her high level pain.  No abdomen pain or neuro symptoms associated with nausea.  Genitourinary: Negative for dysuria, urgency, frequency, flank pain, decreased urine volume, enuresis, difficulty urinating and menstrual problem.  Musculoskeletal:       Low back pain, Mid thoracic, bilateral hip pain. Mild lt scapula region tenderness. Chest wall tender costochondral junction.  Skin: Negative.   Neurological: Negative.   Hematological: Negative for adenopathy. Does not bruise/bleed easily.  Psychiatric/Behavioral: Negative.          Objective:   Physical Exam  Constitutional: She is oriented to person, place, and time. She appears well-developed and well-nourished. No distress.  Plleasant and smiling. Does not appear to be in acute pain.(But possilble stoic in her presentation)  Neck: Normal range of motion. Neck supple. No JVD present. No tracheal deviation present. No thyromegaly present.  Cardiovascular: Normal rate, regular rhythm and normal heart sounds.  Exam reveals no gallop.   No murmur heard. Pulmonary/Chest: Effort normal and breath sounds normal. No stridor. No respiratory distress. She has no wheezes. She has no rales. She exhibits no tenderness.  Abdominal: Soft. Bowel sounds are normal. She exhibits no distension and no mass. There is no tenderness. There is no rebound and no guarding.  Musculoskeletal: She exhibits no edema and no tenderness.    Rt side costochandral junction. Mild tender to palpation. Mild mid- lumbar and thoracic tenderness.  Pain on straight leg lift lower back. Lower ext- 5/5 equal and  symmetric strength. Normal reflexes. No foot drop l5-s1 sensation intact.  Both hips- moderate pain on rom. No crepitus.  Lymphadenopathy:    She has no cervical adenopathy.  Neurological: She is alert and oriented to person, place, and time. No cranial nerve deficit. Coordination normal.  L5-s1 sensation intact bilaterally. No foot drop. Equal and symmetric strength lower extremity  bilaterally.  Skin: She is not diaphoretic.  Psychiatric: She has a normal mood and affect. Her behavior is normal. Judgment and thought content normal.          Assessment & Plan:

## 2013-10-15 NOTE — Assessment & Plan Note (Signed)
Xray was normal. Pt has no radicular pain. At this point, I am referring her to PT. I am limited to type of meds can prescribe since she is breast feeding. I advised pt on criteria of back pain that would prompt me to get mri and advised pt to notify me of any direct rt cva area pain as she may have stone in her rt kidney.

## 2013-10-15 NOTE — Assessment & Plan Note (Signed)
Present since delivery and with her moderate high level back pain. Did rx zofran. Currently no illness signs or symptoms accounting for nausea.

## 2013-10-16 NOTE — Telephone Encounter (Signed)
A user error has taken place.

## 2013-10-29 ENCOUNTER — Ambulatory Visit: Payer: Medicaid Other | Admitting: Medical

## 2013-10-31 ENCOUNTER — Ambulatory Visit: Payer: Medicaid Other | Admitting: Physical Therapy

## 2013-11-01 ENCOUNTER — Ambulatory Visit: Payer: Medicaid Other | Attending: Medical

## 2013-11-01 DIAGNOSIS — Z5189 Encounter for other specified aftercare: Secondary | ICD-10-CM | POA: Insufficient documentation

## 2013-11-01 DIAGNOSIS — R262 Difficulty in walking, not elsewhere classified: Secondary | ICD-10-CM | POA: Insufficient documentation

## 2013-11-01 DIAGNOSIS — M5417 Radiculopathy, lumbosacral region: Secondary | ICD-10-CM | POA: Insufficient documentation

## 2013-11-02 ENCOUNTER — Ambulatory Visit (INDEPENDENT_AMBULATORY_CARE_PROVIDER_SITE_OTHER): Payer: Medicaid Other | Admitting: Medical

## 2013-11-02 ENCOUNTER — Encounter: Payer: Self-pay | Admitting: Medical

## 2013-11-02 VITALS — BP 118/70 | HR 75 | Temp 98.6°F | Wt 249.0 lb

## 2013-11-02 DIAGNOSIS — M5416 Radiculopathy, lumbar region: Secondary | ICD-10-CM

## 2013-11-02 DIAGNOSIS — M5410 Radiculopathy, site unspecified: Secondary | ICD-10-CM

## 2013-11-02 DIAGNOSIS — G8929 Other chronic pain: Secondary | ICD-10-CM

## 2013-11-02 DIAGNOSIS — M5441 Lumbago with sciatica, right side: Secondary | ICD-10-CM

## 2013-11-02 NOTE — Assessment & Plan Note (Addendum)
For the back pain with radiating features will put in order for mri lumbar spine without contrast. Continue tylenol and ibuprofen since you are breast feeding.(other meds not recommended with breast feeding). If pain severe with leg weakness, foot drop, incontinence then ED evaluation. Follow up in 3 weeks or as needed. Continue PT exercises given.  Note overall she is better admits with meds otc pain ok. Without meds ADL with baby difficult. Moves faster today getting on exam table today. Movements smoother less stiff.  MRI order placed of lumbar spine.

## 2013-11-02 NOTE — Progress Notes (Signed)
Subjective:    Patient ID: Stephanie Davies, female    DOB: Jan 05, 1976, 38 y.o.   MRN: [id number]  HPI  Pt still ha low back pain. Pt states pain is shooting down her rt leg. She is taking tylenol and ibuprofen.(It is controlling her pain) Pt did go to PT yesterday and was given some exercises to do. Pt is still breast feeding 322 month old. I   I have explained to pt my reasoning on why I do not want to give controlled meds. Not recommended with breast feeding.  Pt states pain from her back radiating to her legs.  Past Medical History  Diagnosis Date  . Alopecia   . Pseudotumor cerebri   . Headache(784.0)   . Hemorrhoids   . Kidney stone     History   Social History  . Marital Status: Married    Spouse Name: N/A    Number of Children: N/A  . Years of Education: N/A   Occupational History  . Not on file.   Social History Main Topics  . Smoking status: Never Smoker   . Smokeless tobacco: Never Used  . Alcohol Use: No  . Drug Use: No  . Sexual Activity: Not Currently    Birth Control/ Protection: None   Other Topics Concern  . Not on file   Social History Narrative  . No narrative on file    Past Surgical History  Procedure Laterality Date  . Cholecystectomy  2004    Family History  Problem Relation Age of Onset  . Esophageal cancer Maternal Grandmother   . Esophageal cancer Maternal Grandfather   . Diabetes Maternal Grandfather   . Diabetes Mother   . Hypertension Mother     No Known Allergies  Current Outpatient Prescriptions on File Prior to Visit  Medication Sig Dispense Refill  . docusate sodium (COLACE) 100 MG capsule Take 1 capsule (100 mg total) by mouth 2 (two) times daily as needed.  30 capsule  2  . ondansetron (ZOFRAN ODT) 8 MG disintegrating tablet Take 1 tablet (8 mg total) by mouth every 8 (eight) hours as needed for nausea or vomiting.  15 tablet  0  . Prenatal Multivit-Min-Fe-FA (PRENATAL VITAMINS) 0.8 MG tablet Take 1 tablet by mouth  daily.  30 tablet  12   No current facility-administered medications on file prior to visit.    BP 118/70  Pulse 75  Temp(Src) 98.6 F (37 C) (Oral)  Wt 249 lb (112.946 kg)  SpO2 97%  Breastfeeding? Yes     Review of Systems  Constitutional: Negative for fever, chills and fatigue.  HENT: Negative.   Respiratory: Negative for cough, chest tightness, shortness of breath and wheezing.   Cardiovascular: Negative for chest pain and palpitations.  Gastrointestinal: Negative.   Genitourinary: Negative.   Musculoskeletal: Positive for back pain.       Mid lspine with some radiating features to RLE.  Neurological: Negative.        Only rt lower ext radiating pain from L-spine. No saddle anesthesia or foot drop.  Hematological: Negative for adenopathy. Does not bruise/bleed easily.  Psychiatric/Behavioral: Negative.        Objective:   Physical Exam  Constitutional: She is oriented to person, place, and time. She appears well-developed and well-nourished. No distress.  Neck: Normal range of motion. Neck supple. No JVD present. No tracheal deviation present. No thyromegaly present.  Cardiovascular: Normal rate, regular rhythm and normal heart sounds.   Pulmonary/Chest: Effort normal and  breath sounds normal. No respiratory distress. She has no wheezes. She has no rales. She exhibits no tenderness.  Abdominal: Soft. Bowel sounds are normal. She exhibits no distension and no mass. There is no tenderness. There is no rebound and no guarding.  Musculoskeletal:  Mid lspine mild tenderness. Pain on straight leg lift.   Lymphadenopathy:    She has no cervical adenopathy.  Neurological: She is alert and oriented to person, place, and time.  L5-S1 sensation intact. Normal reflexes. No foot drop of lower extremities.  Skin: She is not diaphoretic.  Psychiatric: She has a normal mood and affect. Her behavior is normal. Judgment and thought content normal.            Assessment &  Plan:

## 2013-11-02 NOTE — Progress Notes (Signed)
Pre visit review using our clinic review tool, if applicable. No additional management support is needed unless otherwise documented below in the visit note. 

## 2013-11-02 NOTE — Patient Instructions (Signed)
For the back pain with radiating features will put in order for mri lumbar spine without contrast. Continue tylenol and ibuprofen since you are breast feeding.(other meds not recommended with breast feeding). If pain severe with leg weakness, foot drop, incontinence then ED evaluation. Follow up in 3 weeks or as needed. Continue PT exercises given.

## 2013-11-10 ENCOUNTER — Ambulatory Visit (HOSPITAL_BASED_OUTPATIENT_CLINIC_OR_DEPARTMENT_OTHER): Payer: Medicaid Other

## 2013-11-12 ENCOUNTER — Encounter: Payer: Self-pay | Admitting: Medical

## 2013-11-15 ENCOUNTER — Ambulatory Visit (INDEPENDENT_AMBULATORY_CARE_PROVIDER_SITE_OTHER): Payer: Medicaid Other | Admitting: Medical

## 2013-11-15 ENCOUNTER — Encounter: Payer: Self-pay | Admitting: Medical

## 2013-11-15 VITALS — BP 112/72 | HR 85 | Temp 98.4°F | Ht 67.0 in | Wt 248.8 lb

## 2013-11-15 DIAGNOSIS — M791 Myalgia: Secondary | ICD-10-CM

## 2013-11-15 DIAGNOSIS — M609 Myositis, unspecified: Secondary | ICD-10-CM

## 2013-11-15 DIAGNOSIS — R062 Wheezing: Secondary | ICD-10-CM

## 2013-11-15 DIAGNOSIS — R52 Pain, unspecified: Secondary | ICD-10-CM

## 2013-11-15 DIAGNOSIS — IMO0001 Reserved for inherently not codable concepts without codable children: Secondary | ICD-10-CM | POA: Insufficient documentation

## 2013-11-15 DIAGNOSIS — J209 Acute bronchitis, unspecified: Secondary | ICD-10-CM

## 2013-11-15 MED ORDER — ALBUTEROL SULFATE HFA 108 (90 BASE) MCG/ACT IN AERS: 2.0000 | INHALATION_SPRAY | Freq: Four times a day (QID) | RESPIRATORY_TRACT | Status: DC | PRN

## 2013-11-15 MED ORDER — BECLOMETHASONE DIPROPIONATE 40 MCG/ACT IN AERS: 2.0000 | INHALATION_SPRAY | Freq: Two times a day (BID) | RESPIRATORY_TRACT | Status: DC

## 2013-11-15 MED ORDER — AZITHROMYCIN 250 MG PO TABS: ORAL_TABLET | ORAL | Status: DC

## 2013-11-15 NOTE — Patient Instructions (Signed)
Your appear to have probable bronchitis with some subsequent reactive airway disease(with wheeze and cough). I am prescirbing azithromycin, qvar, inhaler and albuterol inhaler.  If your symptoms persist then would need cxr.  We did rapid flu test today in our office.  Follow up in 7 days or as needed.

## 2013-11-15 NOTE — Assessment & Plan Note (Signed)
Probable bronchitis with some subsequent reactive airway disease(with wheeze and cough). I am prescirbing azithromycin, qvar, inhaler and albuterol inhaler

## 2013-11-15 NOTE — Assessment & Plan Note (Signed)
Reactive airway disease(with wheeze and cough). Iqvar, inhaler and albuterol inhaler

## 2013-11-15 NOTE — Progress Notes (Signed)
Subjective:    Patient ID: Stephanie Davies, female    DOB: 08-22-1975, 38 y.o.   MRN: [id number]  HPI  Pt has 5 days of coughing that is productive. Also nasal congestion with sinus pressure. Pt is wheezing some. Pt has no history of any asthma. Getting the wheezing sound more at night x 5 nights. Pt does have some bodyaches.(Diffuse for one week).  Past Medical History  Diagnosis Date  . Alopecia   . Pseudotumor cerebri   . Headache(784.0)   . Hemorrhoids   . Kidney stone     History   Social History  . Marital Status: Married    Spouse Name: N/A    Number of Children: N/A  . Years of Education: N/A   Occupational History  . Not on file.   Social History Main Topics  . Smoking status: Never Smoker   . Smokeless tobacco: Never Used  . Alcohol Use: No  . Drug Use: No  . Sexual Activity: Not Currently    Birth Control/ Protection: None   Other Topics Concern  . Not on file   Social History Narrative    Past Surgical History  Procedure Laterality Date  . Cholecystectomy  2004    Family History  Problem Relation Age of Onset  . Esophageal cancer Maternal Grandmother   . Esophageal cancer Maternal Grandfather   . Diabetes Maternal Grandfather   . Diabetes Mother   . Hypertension Mother     No Known Allergies  Current Outpatient Prescriptions on File Prior to Visit  Medication Sig Dispense Refill  . docusate sodium (COLACE) 100 MG capsule Take 1 capsule (100 mg total) by mouth 2 (two) times daily as needed. 30 capsule 2  . ondansetron (ZOFRAN ODT) 8 MG disintegrating tablet Take 1 tablet (8 mg total) by mouth every 8 (eight) hours as needed for nausea or vomiting. 15 tablet 0  . Prenatal Multivit-Min-Fe-FA (PRENATAL VITAMINS) 0.8 MG tablet Take 1 tablet by mouth daily. 30 tablet 12   No current facility-administered medications on file prior to visit.    BP 112/72 mmHg  Pulse 85  Temp(Src) 98.4 F (36.9 C) (Oral)  Ht 5\' 7"  (1.702 m)  Wt 248 lb 12.8 oz  (112.855 kg)  BMI 38.96 kg/m2  SpO2 99%     Review of Systems  Constitutional: Negative for fever, chills and fatigue.  HENT: Positive for congestion and sinus pressure. Negative for drooling, ear discharge, ear pain, facial swelling, mouth sores, nosebleeds, postnasal drip and sore throat.   Respiratory: Positive for cough and wheezing. Negative for apnea, choking, chest tightness and shortness of breath.   Cardiovascular: Negative for chest pain, palpitations and leg swelling.  Gastrointestinal: Negative.   Genitourinary: Negative.   Musculoskeletal:       Diffuse mild myalgias for 7 days.  Skin: Negative.   Neurological: Negative.   Hematological: Negative.   Psychiatric/Behavioral: Negative.        Objective:   Physical Exam   General  Mental Status - Alert. General Appearance - Well groomed. Not in acute distress.  Skin Rashes- No Rashes.  HEENT Head- Normal. Ear Auditory Canal - Left- Normal. Right - Normal.Tympanic Membrane- Left- Normal. Right- Normal. Eye Sclera/Conjunctiva- Left- Normal. Right- Normal. Nose & Sinuses Nasal Mucosa- Left- Not Boggy or Congested. Right- Not Boggy or Congested. Mouth & Throat Lips: Upper Lip- Normal: no dryness, cracking, pallor, cyanosis, or vesicular eruption. Lower Lip-Normal: no dryness, cracking, pallor, cyanosis or vesicular eruption. Buccal Mucosa- Bilateral-  No Aphthous ulcers. Oropharynx- No Discharge or Erythema. Tonsils: Characteristics- Bilateral- No Erythema or Congestion. Size/Enlargement- Bilateral- No enlargement. Discharge- bilateral-None.  Neck Neck- Supple. No Masses.   Chest and Lung Exam Auscultation: Breath Sounds:-Normal. CTA.  Cardiovascular Auscultation:Rythm- Regular, Rate and rhythm.  Murmurs & Other Heart Sounds:Ausculatation of the heart reveal- No Murmurs.  Lymphatic Head & Neck General Head & Neck Lymphatics: Bilateral: Description- No Localized lymphadenopathy.         Assessment  & Plan:

## 2013-11-15 NOTE — Assessment & Plan Note (Signed)
With upper respiratory symptoms did rapid flu. Test was negative.

## 2013-11-15 NOTE — Progress Notes (Signed)
Pre visit review using our clinic review tool, if applicable. No additional management support is needed unless otherwise documented below in the visit note. 

## 2013-11-17 ENCOUNTER — Ambulatory Visit (HOSPITAL_BASED_OUTPATIENT_CLINIC_OR_DEPARTMENT_OTHER)
Admission: RE | Admit: 2013-11-17 | Discharge: 2013-11-17 | Disposition: A | Discharge status: Home / Self Care | Payer: Medicaid Other | Source: Ambulatory Visit | Attending: Medical | Admitting: Medical

## 2013-11-17 DIAGNOSIS — M5410 Radiculopathy, site unspecified: Secondary | ICD-10-CM | POA: Diagnosis not present

## 2013-11-17 DIAGNOSIS — M5416 Radiculopathy, lumbar region: Secondary | ICD-10-CM

## 2013-11-17 DIAGNOSIS — M5441 Lumbago with sciatica, right side: Secondary | ICD-10-CM | POA: Insufficient documentation

## 2013-11-17 DIAGNOSIS — G8929 Other chronic pain: Secondary | ICD-10-CM | POA: Diagnosis not present

## 2014-01-08 ENCOUNTER — Ambulatory Visit: Payer: Medicaid Other | Admitting: Medical

## 2014-01-09 ENCOUNTER — Encounter: Payer: Self-pay | Admitting: Medical

## 2014-01-09 ENCOUNTER — Ambulatory Visit (INDEPENDENT_AMBULATORY_CARE_PROVIDER_SITE_OTHER): Payer: Medicaid Other | Admitting: Medical

## 2014-01-09 ENCOUNTER — Emergency Department (HOSPITAL_COMMUNITY): Payer: Medicaid Other

## 2014-01-09 ENCOUNTER — Emergency Department (HOSPITAL_COMMUNITY)
Admission: EM | Admit: 2014-01-09 | Discharge: 2014-01-10 | Disposition: A | Discharge status: Home / Self Care | Payer: Medicaid Other | Attending: Emergency Medicine | Admitting: Emergency Medicine

## 2014-01-09 ENCOUNTER — Telehealth: Payer: Self-pay | Admitting: Medical

## 2014-01-09 ENCOUNTER — Encounter (HOSPITAL_COMMUNITY): Payer: Self-pay | Admitting: Adult Health

## 2014-01-09 VITALS — BP 109/72 | Temp 98.4°F | Ht 67.0 in | Wt 256.6 lb

## 2014-01-09 DIAGNOSIS — R0609 Other forms of dyspnea: Secondary | ICD-10-CM | POA: Insufficient documentation

## 2014-01-09 DIAGNOSIS — L239 Allergic contact dermatitis, unspecified cause: Secondary | ICD-10-CM | POA: Insufficient documentation

## 2014-01-09 DIAGNOSIS — Z7951 Long term (current) use of inhaled steroids: Secondary | ICD-10-CM | POA: Insufficient documentation

## 2014-01-09 DIAGNOSIS — B353 Tinea pedis: Secondary | ICD-10-CM

## 2014-01-09 DIAGNOSIS — Z87442 Personal history of urinary calculi: Secondary | ICD-10-CM | POA: Diagnosis not present

## 2014-01-09 DIAGNOSIS — Z3202 Encounter for pregnancy test, result negative: Secondary | ICD-10-CM | POA: Diagnosis not present

## 2014-01-09 DIAGNOSIS — R791 Abnormal coagulation profile: Secondary | ICD-10-CM | POA: Insufficient documentation

## 2014-01-09 DIAGNOSIS — Z5181 Encounter for therapeutic drug level monitoring: Secondary | ICD-10-CM

## 2014-01-09 DIAGNOSIS — R079 Chest pain, unspecified: Secondary | ICD-10-CM

## 2014-01-09 DIAGNOSIS — Z8719 Personal history of other diseases of the digestive system: Secondary | ICD-10-CM | POA: Diagnosis not present

## 2014-01-09 DIAGNOSIS — L200 Besnier's prurigo: Secondary | ICD-10-CM

## 2014-01-09 DIAGNOSIS — Z872 Personal history of diseases of the skin and subcutaneous tissue: Secondary | ICD-10-CM | POA: Diagnosis not present

## 2014-01-09 DIAGNOSIS — R7989 Other specified abnormal findings of blood chemistry: Secondary | ICD-10-CM

## 2014-01-09 DIAGNOSIS — Z79899 Other long term (current) drug therapy: Secondary | ICD-10-CM | POA: Diagnosis not present

## 2014-01-09 DIAGNOSIS — R0600 Dyspnea, unspecified: Secondary | ICD-10-CM

## 2014-01-09 DIAGNOSIS — Z8669 Personal history of other diseases of the nervous system and sense organs: Secondary | ICD-10-CM | POA: Diagnosis not present

## 2014-01-09 DIAGNOSIS — R062 Wheezing: Secondary | ICD-10-CM

## 2014-01-09 LAB — BASIC METABOLIC PANEL
Anion gap: 8 (ref 5–15)
BUN: 8 mg/dL (ref 6–23)
CO2: 23 mmol/L (ref 19–32)
Calcium: 9.1 mg/dL (ref 8.4–10.5)
Chloride: 107 mEq/L (ref 96–112)
Creatinine, Ser: 0.67 mg/dL (ref 0.50–1.10)
GFR calc Af Amer: >90 mL/min (ref >90)
GFR calc non Af Amer: >90 mL/min (ref >90)
Glucose, Bld: 86 mg/dL (ref 70–99)
Potassium: 3.8 mmol/L (ref 3.5–5.1)
Sodium: 138 mmol/L (ref 135–145)

## 2014-01-09 LAB — CBC
HCT: 38.9 % (ref 36.0–46.0)
Hemoglobin: 12.4 g/dL (ref 12.0–15.0)
MCH: 25.2 pg — ABNORMAL LOW (ref 26.0–34.0)
MCHC: 31.9 g/dL (ref 30.0–36.0)
MCV: 78.9 fL (ref 78.0–100.0)
Platelets: 424 10*3/uL — ABNORMAL HIGH (ref 150–400)
RBC: 4.93 MIL/uL (ref 3.87–5.11)
RDW: 16 % — ABNORMAL HIGH (ref 11.5–15.5)
WBC: 10.5 10*3/uL (ref 4.0–10.5)

## 2014-01-09 LAB — POC URINE PREG, ED: Preg Test, Ur: NEGATIVE

## 2014-01-09 LAB — COMPREHENSIVE METABOLIC PANEL
ALT: 64 U/L — ABNORMAL HIGH (ref 0–35)
AST: 24 U/L (ref 0–37)
Albumin: 3.5 g/dL (ref 3.5–5.2)
Alkaline Phosphatase: 110 U/L (ref 39–117)
BUN: 11 mg/dL (ref 6–23)
CO2: 24 mEq/L (ref 19–32)
Calcium: 8.8 mg/dL (ref 8.4–10.5)
Chloride: 108 mEq/L (ref 96–112)
Creatinine, Ser: 0.7 mg/dL (ref 0.4–1.2)
GFR: 97.27 mL/min (ref >60.00)
Glucose, Bld: 93 mg/dL (ref 70–99)
Potassium: 3.9 mEq/L (ref 3.5–5.1)
Sodium: 139 mEq/L (ref 135–145)
Total Bilirubin: 0.3 mg/dL (ref 0.2–1.2)
Total Protein: 7.2 g/dL (ref 6.0–8.3)

## 2014-01-09 LAB — I-STAT TROPONIN, ED: Troponin i, poc: 0 ng/mL (ref 0.00–0.08)

## 2014-01-09 LAB — BRAIN NATRIURETIC PEPTIDE: B Natriuretic Peptide: 18.8 pg/mL (ref 0.0–100.0)

## 2014-01-09 LAB — D-DIMER, QUANTITATIVE: D-Dimer, Quant: 0.81 ug/mL-FEU — ABNORMAL HIGH (ref 0.00–0.48)

## 2014-01-09 MED ORDER — BECLOMETHASONE DIPROPIONATE 40 MCG/ACT IN AERS: 2.0000 | INHALATION_SPRAY | Freq: Two times a day (BID) | RESPIRATORY_TRACT | Status: DC

## 2014-01-09 MED ORDER — IOHEXOL 350 MG/ML SOLN
80.0000 mL | Freq: Once | INTRAVENOUS | Status: AC | PRN
  Administered 2014-01-09: 80 mL via INTRAVENOUS

## 2014-01-09 MED ORDER — AMMONIUM LACTATE 12 % EX LOTN: TOPICAL_LOTION | CUTANEOUS | Status: DC

## 2014-01-09 MED ORDER — ALBUTEROL SULFATE HFA 108 (90 BASE) MCG/ACT IN AERS: 2.0000 | INHALATION_SPRAY | Freq: Four times a day (QID) | RESPIRATORY_TRACT | Status: DC | PRN

## 2014-01-09 MED ORDER — HYDROXYZINE HCL 25 MG PO TABS: 25.0000 mg | ORAL_TABLET | Freq: Three times a day (TID) | ORAL | Status: DC | PRN

## 2014-01-09 NOTE — ED Provider Notes (Signed)
CSN: [id number]     Arrival date & time 01/09/14  1853 History   First MD Initiated Contact with Patient 01/09/14 2103     Chief Complaint  Patient presents with  . Chest Pain     (Consider location/radiation/quality/duration/timing/severity/associated sxs/prior Treatment) Patient is a 38 y.o. female presenting with chest pain. The history is provided by the patient.  Chest Pain Pain location:  Substernal area Pain quality: pressure   Pain radiates to:  Does not radiate Pain radiates to the back: no   Pain severity:  Moderate Duration:  5 months Timing:  Intermittent Progression:  Waxing and waning Chronicity:  Chronic Context: at rest   Relieved by: Sitting up. Exacerbated by: Lying flat. Ineffective treatments: Inhaler. Associated symptoms: cough (1.  Episode of hemoptysis approximately one month ago) and shortness of breath   Associated symptoms: no abdominal pain, no anorexia, no back pain, no diaphoresis, no fatigue, no fever, no headache, no lower extremity edema, no nausea, no numbness, no palpitations, not vomiting and no weakness   Associated symptoms comment:  Right leg pain from the hip radiating distally Risk factors: no birth control, no coronary artery disease, no prior DVT/PE and no smoking     Past Medical History  Diagnosis Date  . Alopecia   . Pseudotumor cerebri   . Headache(784.0)   . Hemorrhoids   . Kidney stone    Past Surgical History  Procedure Laterality Date  . Cholecystectomy  2004   Family History  Problem Relation Age of Onset  . Esophageal cancer Maternal Grandmother   . Esophageal cancer Maternal Grandfather   . Diabetes Maternal Grandfather   . Diabetes Mother   . Hypertension Mother    History  Substance Use Topics  . Smoking status: Never Smoker   . Smokeless tobacco: Never Used  . Alcohol Use: No   OB History    Gravida Para Term Preterm AB TAB SAB Ectopic Multiple Living   1 1 1       1      Review of Systems   Constitutional: Negative for fever, chills, diaphoresis, activity change, appetite change and fatigue.  HENT: Negative for congestion, facial swelling, rhinorrhea and sore throat.   Eyes: Negative for photophobia and discharge.  Respiratory: Positive for cough (1.  Episode of hemoptysis approximately one month ago) and shortness of breath. Negative for chest tightness.   Cardiovascular: Positive for chest pain. Negative for palpitations and leg swelling.  Gastrointestinal: Negative for nausea, vomiting, abdominal pain, diarrhea and anorexia.  Endocrine: Negative for polydipsia and polyuria.  Genitourinary: Negative for dysuria, frequency, difficulty urinating and pelvic pain.  Musculoskeletal: Negative for back pain, arthralgias, neck pain and neck stiffness.  Skin: Negative for color change and wound.  Allergic/Immunologic: Negative for immunocompromised state.  Neurological: Negative for facial asymmetry, weakness, numbness and headaches.  Hematological: Does not bruise/bleed easily.  Psychiatric/Behavioral: Negative for confusion and agitation.      Allergies  Review of patient's allergies indicates no known allergies.  Home Medications   Prior to Admission medications   Medication Sig Start Date End Date Taking? Authorizing Provider  albuterol (PROVENTIL HFA;VENTOLIN HFA) 108 (90 BASE) MCG/ACT inhaler Inhale 2 puffs into the lungs every 6 (six) hours as needed for wheezing or shortness of breath. 11/15/13   Bayard BeaverEdward M Saguier, PA-C  albuterol (PROVENTIL HFA;VENTOLIN HFA) 108 (90 BASE) MCG/ACT inhaler Inhale 2 puffs into the lungs every 6 (six) hours as needed for wheezing or shortness of breath. 01/09/14  Bayard BeaverEdward M Saguier, PA-C  ammonium lactate (LAC-HYDRIN) 12 % lotion Apply to area bid 01/09/14   Bayard BeaverEdward M Saguier, PA-C  beclomethasone (QVAR) 40 MCG/ACT inhaler Inhale 2 puffs into the lungs 2 (two) times daily. 11/15/13   Bayard BeaverEdward M Saguier, PA-C  beclomethasone (QVAR) 40 MCG/ACT  inhaler Inhale 2 puffs into the lungs 2 (two) times daily. 01/09/14   Bayard BeaverEdward M Saguier, PA-C  docusate sodium (COLACE) 100 MG capsule Take 1 capsule (100 mg total) by mouth 2 (two) times daily as needed. 09/05/13   Wilmer FloorLisa A Leftwich-Kirby, CNM  hydrOXYzine (ATARAX/VISTARIL) 25 MG tablet Take 1 tablet (25 mg total) by mouth 3 (three) times daily as needed for itching. 01/09/14   Bayard BeaverEdward M Saguier, PA-C  ondansetron (ZOFRAN ODT) 8 MG disintegrating tablet Take 1 tablet (8 mg total) by mouth every 8 (eight) hours as needed for nausea or vomiting. 10/15/13   Bayard BeaverEdward M Saguier, PA-C  Prenatal Multivit-Min-Fe-FA (PRENATAL VITAMINS) 0.8 MG tablet Take 1 tablet by mouth daily. 06/11/13   Vale HavenKeli L Beck, MD   BP 105/57 mmHg  Pulse 79  Temp(Src) 98.2 F (36.8 C)  Resp 24  SpO2 98%  LMP 12/06/2013  Breastfeeding? Yes Physical Exam  Constitutional: She is oriented to person, place, and time. She appears well-developed and well-nourished. No distress.  HENT:  Head: Normocephalic and atraumatic.  Mouth/Throat: No oropharyngeal exudate.  Eyes: Pupils are equal, round, and reactive to light.  Neck: Normal range of motion. Neck supple.  Cardiovascular: Normal rate, regular rhythm and normal heart sounds.  Exam reveals no gallop and no friction rub.   No murmur heard. Pulmonary/Chest: Effort normal and breath sounds normal. No respiratory distress. She has no wheezes. She has no rales.  Abdominal: Soft. Bowel sounds are normal. She exhibits no distension and no mass. There is no tenderness. There is no rebound and no guarding.  Musculoskeletal: Normal range of motion. She exhibits no edema or tenderness.  Neurological: She is alert and oriented to person, place, and time.  Skin: Skin is warm and dry.  Psychiatric: She has a normal mood and affect.    ED Course  Procedures (including critical care time) Labs Review Labs Reviewed  CBC - Abnormal; Notable for the following:    MCH 25.2 (*)    RDW 16.0 (*)     Platelets 424 (*)    All other components within normal limits  BASIC METABOLIC PANEL  I-STAT TROPOININ, ED  POC URINE PREG, ED    Imaging Review No results found.   EKG Interpretation None       Date: 01/09/2014  Rate: 78  Rhythm: normal sinus rhythm  QRS Axis: normal  Intervals: normal  ST/T Wave abnormalities: normal  Conduction Disutrbances: none  Narrative Interpretation: unremarkable.  No prior for comparison     MDM   Final diagnoses:  Chest pain  Positive D dimer    Pt is a 38 y.o. female with Pmhx as above who presents with 5 months of central chest tightness and difficulty lying flat due to worsening chest tightness and shortness of breath.  Patient was seen by PCP today.  D-dimer is elevated.  Patient sent to evaluate for PE.  On physical exam, vital signs are stable patient's and no acute distress.  She has a normal cardiopulmonary exam.  No lower extremity swelling or tenderness in the calves.  On physical examination, does report having for months of left leg pain starting from the hip and radiating distally.  Troponin not elevated.  No significant findings in CBC or BMP.  CTA chest normal.  BNP not elevated.  I believe she is safe for close outpatient follow-up with her PCP for ultrasound of the thyroid.  4.  Incidental thyroid nodule.  I were also give her referral for cardiology.    Aiyanah Vanacker evaluation in the Emergency Department is complete. It has been determined that no acute conditions requiring further emergency intervention are present at this time. The patient/guardian have been advised of the diagnosis and plan. We have discussed signs and symptoms that warrant return to the ED, such as changes or worsening in symptoms, worsening pain, worsening shortness of breath, fever, leg swelling      Toy Cookey, MD 01/10/14 [id number]

## 2014-01-09 NOTE — Assessment & Plan Note (Signed)
Use lamisil cream otc for your spaces between toes and keep your feet dry.

## 2014-01-09 NOTE — Assessment & Plan Note (Signed)
Refer pt to pulmonologist since I have seen her in the past and cxr negative, lower ext dopplers negative, and d-dimer in past negative. She mentions if uses various pillow and sits up straight not sob. She is not aware of any snoring. He o2 sat is good today.

## 2014-01-09 NOTE — Progress Notes (Signed)
Subjective:    Patient ID: Stephanie Davies, female    DOB: 21-Jun-1975, 38 y.o.   MRN: [id number]  HPI   Pt has recent rash that itches. Reports rash occurred after no known particular exposure. On review pt does not report any suspicious exposure to soaps, creams, detergents, make up, lotions, detergents, animal exposure exposure, plants or insect bites.  Pt rash is in the area of popliteal fossa region and some on hamstring areas. None in antecubital fossa . Pt reports no shortness of breath or wheezing. . Pt is not diabetic. When younger had some skin rashes but never diagnosed  Pt nails have been disfigured and discolored on her toenails. Noticed for about a month. And some itching between her toes.  Pt also mentions occasional rare wheezing using at end of the day. This has been going on for 2-3 weeks.  Past Medical History  Diagnosis Date  . Alopecia   . Pseudotumor cerebri   . Headache(784.0)   . Hemorrhoids   . Kidney stone     History   Social History  . Marital Status: Married    Spouse Name: N/A    Number of Children: N/A  . Years of Education: N/A   Occupational History  . Not on file.   Social History Main Topics  . Smoking status: Never Smoker   . Smokeless tobacco: Never Used  . Alcohol Use: No  . Drug Use: No  . Sexual Activity: Not Currently    Birth Control/ Protection: None   Other Topics Concern  . Not on file   Social History Narrative    Past Surgical History  Procedure Laterality Date  . Cholecystectomy  2004    Family History  Problem Relation Age of Onset  . Esophageal cancer Maternal Grandmother   . Esophageal cancer Maternal Grandfather   . Diabetes Maternal Grandfather   . Diabetes Mother   . Hypertension Mother     No Known Allergies  Current Outpatient Prescriptions on File Prior to Visit  Medication Sig Dispense Refill  . albuterol (PROVENTIL HFA;VENTOLIN HFA) 108 (90 BASE) MCG/ACT inhaler Inhale 2 puffs into the lungs  every 6 (six) hours as needed for wheezing or shortness of breath. 1 Inhaler 0  . beclomethasone (QVAR) 40 MCG/ACT inhaler Inhale 2 puffs into the lungs 2 (two) times daily. 1 Inhaler 0  . docusate sodium (COLACE) 100 MG capsule Take 1 capsule (100 mg total) by mouth 2 (two) times daily as needed. 30 capsule 2  . Prenatal Multivit-Min-Fe-FA (PRENATAL VITAMINS) 0.8 MG tablet Take 1 tablet by mouth daily. 30 tablet 12  . ondansetron (ZOFRAN ODT) 8 MG disintegrating tablet Take 1 tablet (8 mg total) by mouth every 8 (eight) hours as needed for nausea or vomiting. (Patient not taking: Reported on 01/09/2014) 15 tablet 0   No current facility-administered medications on file prior to visit.    BP 109/72 mmHg  Temp(Src) 98.4 F (36.9 C) (Oral)  Ht 5\' 7"  (1.702 m)  Wt 256 lb 9.6 oz (116.393 kg)  BMI 40.18 kg/m2  SpO2 97%  LMP 12/06/2013      Review of Systems  Constitutional: Negative for fever, chills and fatigue.  Respiratory: Negative for cough, chest tightness, shortness of breath and wheezing.   Cardiovascular: Negative for chest pain and palpitations.  Gastrointestinal: Negative for nausea, vomiting, abdominal pain, diarrhea, constipation, blood in stool, abdominal distention, anal bleeding and rectal pain.  Musculoskeletal: Negative for back pain.  Skin: Positive for rash.  Objective:   Physical Exam   General- No acute distress. Pleasant patient. Neck- Full range of motion, no jvd Lungs- Clear, even and unlabored. Heart- regular rate and rhythm. Neurologic- CNII- XII grossly intact. Skin- faint rash in popliteal fossa. Both sides.  Feet- thick yellowish brown toenails. Mild flaking of skin between the toes.  Lower extremity- no pedal edema and negative homans signs.         Assessment & Plan:

## 2014-01-09 NOTE — Progress Notes (Signed)
Pre visit review using our clinic review tool, if applicable. No additional management support is needed unless otherwise documented below in the visit note. 

## 2014-01-09 NOTE — Assessment & Plan Note (Signed)
You may have eczema on your legs. I am prescribing lac-hydrin and hydroxyzine. If rash persists let us know

## 2014-01-09 NOTE — Assessment & Plan Note (Signed)
For you wheezing, I am refilling your qvar and your albuterol Note how often you use albuterol.(keep in mind only use albuterol for sob, wheezing or very dry cough)  I will refer you to pulmonologist for your sob reclining at night.   Follow up in 3 weeks or as needed.

## 2014-01-09 NOTE — ED Notes (Addendum)
Presents with 4 months of chest pain, SOB-rpt had a baby 4 months ago, she was sent here by doctor due to elevated D-Dimer. Lying flat makes SOB worse. Chest is described as tightness.

## 2014-01-09 NOTE — Patient Instructions (Addendum)
You may have eczema on your legs. I am prescribing lac-hydrin and hydroxyzine. If rash persists let us know.  For you toe nail fungus I am going to get cmp to check you liver function and if you decide you want to use lamisil tablets let us know when we call you on your liver function test.    For you wheezing, I am refilling your qvar and your albuterol Note how often you use albuterol.(keep in mind only use albuterol for sob, wheezing or very dry cough)  I will refer you to pulmonologist for your sob reclining at night.   Follow up in 3 weeks or as needed.  All of above oral  meds except inhalers not recommended during breast feeding. So you mentioned your daughter has breast fed less. So now may be good time to stop completley.

## 2014-01-09 NOTE — Telephone Encounter (Signed)
I notified pt of her d-dimer test elevation and advised ED tonight for further workup ct of chest and maybe lower ext dopplers. Explained importance of work up [facility]. Pt verbalized understanding and agreed she would go tonight.   I educated on possibility of PE.

## 2014-01-10 NOTE — Discharge Instructions (Signed)

## 2014-01-21 ENCOUNTER — Encounter: Payer: Self-pay | Admitting: Internal Medicine

## 2014-01-21 ENCOUNTER — Ambulatory Visit (INDEPENDENT_AMBULATORY_CARE_PROVIDER_SITE_OTHER): Payer: Medicaid Other | Admitting: Internal Medicine

## 2014-01-21 VITALS — BP 110/80 | HR 70 | Ht 66.0 in | Wt 254.0 lb

## 2014-01-21 DIAGNOSIS — R05 Cough: Secondary | ICD-10-CM

## 2014-01-21 DIAGNOSIS — R058 Other specified cough: Secondary | ICD-10-CM

## 2014-01-21 MED ORDER — PREDNISONE 10 MG PO TABS: ORAL_TABLET | ORAL | Status: DC

## 2014-01-21 MED ORDER — PANTOPRAZOLE SODIUM 40 MG PO TBEC: 40.0000 mg | DELAYED_RELEASE_TABLET | Freq: Every day | ORAL | Status: DC

## 2014-01-21 MED ORDER — FAMOTIDINE 20 MG PO TABS: ORAL_TABLET | ORAL | Status: DC

## 2014-01-21 NOTE — Patient Instructions (Addendum)
Prednisone 10 mg take  4 each am x 2 days,   2 each am x 2 days,  1 each am x 2 days and stop   Stop qvar for now   Pantoprazole (protonix) 40 mg   Take 30-60 min before first meal of the day and Pepcid 20 mg one bedtime along with chlortrimeton 4 mg at bedtime until you return  GERD (REFLUX)  is an extremely common cause of respiratory symptoms just like yours , many times with no obvious heartburn at all.    It can be treated with medication, but also with lifestyle changes including avoidance of late meals, excessive alcohol, smoking cessation, and avoid fatty foods, chocolate, peppermint, colas, red wine, and acidic juices such as orange juice.  NO MINT OR MENTHOL PRODUCTS SO NO COUGH DROPS  USE SUGARLESS CANDY INSTEAD (Jolley ranchers or Stover's or Life Savers) or even ice chips will also do - the key is to swallow to prevent all throat clearing. NO OIL BASED VITAMINS - use powdered substitutes.  Please schedule a follow up office visit in 4 weeks, sooner if needed

## 2014-01-21 NOTE — Progress Notes (Signed)
   Subjective:    Patient ID: Stephanie Davies, female    DOB: 1975/04/05,   MRN: [id number]  HPI  39 yo [place] female smoker immigrated in 1999 with first term baby Aug 2015  With onset during the last month of IUP= chest tight, sob and feels like rock on chest no change at all p baby born referred by Stephanie Davies to pulmonary clinic 01/21/2014    01/21/2014 1st [facility] office visit/ [name]   Chief Complaint  Patient presents with  . Pulmonary Consult    Referred by Stephanie Davies. [name]  She states "feels like a rock in my chest".    ex tol not back to normal since delivery with doe x more than slow adls Cough did not start until  early dec 2015 day > noct,  ? Better immediately p  Qvar, dry    Better if stays up >  30 degrees when supine Symptoms were are insidious, pattern is daily, noct, not really progressive but certainly persistent Has saba but never took out of box   No obvious other patterns in day to day or daytime variabilty or assoc  subjective wheeze overt sinus or hb symptoms. No unusual exp hx or h/o childhood pna/ asthma or knowledge of premature birth.   Also denies any obvious fluctuation of symptoms with weather or environmental changes or other aggravating or alleviating factors except as outlined above   Current Medications, Allergies, Complete Past Medical History, Past Surgical History, Family History, and Social History were reviewed in [facility] electronic medical record.           Review of Systems  Constitutional: Negative for fever, chills and unexpected weight change.  HENT: Negative for congestion, dental problem, ear pain, nosebleeds, postnasal drip, rhinorrhea, sinus pressure, sneezing, sore throat, trouble swallowing and voice change.   Eyes: Negative for visual disturbance.  Respiratory: Positive for cough. Negative for choking and shortness of breath.     Cardiovascular: Positive for chest pain. Negative for leg swelling.  Gastrointestinal: Negative for vomiting, abdominal pain and diarrhea.  Genitourinary: Negative for difficulty urinating.  Musculoskeletal: Negative for arthralgias.  Skin: Negative for rash.  Neurological: Negative for tremors, syncope and headaches.  Hematological: Does not bruise/bleed easily.       Objective:   Physical Exam   amb arabic females nad  Wt Readings from Last 3 Encounters:  01/21/14 254 lb (115.214 kg)  01/09/14 256 lb 9.6 oz (116.393 kg)  11/15/13 248 lb 12.8 oz (112.855 kg)    Vital signs reviewed   HEENT: nl dentition, turbinates, and orophanx. Nl external ear canals without cough reflex   NECK :  without JVD/Nodes/TM/ nl carotid upstrokes bilaterally   LUNGS: no acc muscle use, clear to A and P bilaterally without cough on insp or exp maneuvers   CV:  RRR  no s3 or murmur or increase in P2, no edema   ABD:  soft and nontender with nl excursion in the supine position. No bruits or organomegaly, bowel sounds nl  MS:  warm without deformities, calf tenderness, cyanosis or clubbing  SKIN: warm and dry without lesions    NEURO:  alert, approp, no deficits    Cta 01/10/14 1. No evidence of pulmonary embolism. 2. 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

## 2014-01-22 ENCOUNTER — Encounter: Payer: Self-pay | Admitting: Internal Medicine

## 2014-01-22 DIAGNOSIS — R05 Cough: Secondary | ICD-10-CM | POA: Insufficient documentation

## 2014-01-22 DIAGNOSIS — R058 Other specified cough: Secondary | ICD-10-CM | POA: Insufficient documentation

## 2014-01-22 NOTE — Assessment & Plan Note (Addendum)
The most common causes of chronic cough in immunocompetent adults include the following: upper airway cough syndrome (UACS), previously referred to as postnasal drip syndrome (PNDS), which is caused by variety of rhinosinus conditions; (2) asthma; (3) GERD; (4) chronic bronchitis from cigarette smoking or other inhaled environmental irritants; (5) nonasthmatic eosinophilic bronchitis; and (6) bronchiectasis.   These conditions, singly or in combination, have accounted for up to 94% of the causes of chronic cough in prospective studies.   Other conditions have constituted no >6% of the causes in prospective studies These have included bronchogenic carcinoma, chronic interstitial pneumonia, sarcoidosis, left ventricular failure, ACEI-induced cough, and aspiration from a condition associated with pharyngeal dysfunction.    Chronic cough is often simultaneously caused by more than one condition. A single cause has been found from 38 to 82% of the time, multiple causes from 18 to 62%. Multiply caused cough has been the result of three diseases up to 42% of the time.      I strongly doubt she has asthma as qvar helps immediately but doesn't prevent her symptoms, the exact opposite of how it's supposed to work but could have some form of allergy driving some of her symptoms so rec stop qvar and just give a very short course of prednisone.   However, Based on hx and exam, this is most likely:  Classic Upper airway cough syndrome, so named because it's frequently impossible to sort out how much is  CR/sinusitis with freq throat clearing (which can be related to primary GERD)   vs  causing  secondary (" extra esophageal")  GERD from wide swings in gastric pressure that occur with throat clearing, often  promoting self use of mint and menthol lozenges that reduce the lower esophageal sphincter tone and exacerbate the problem further in a cyclical fashion.   These are the same pts (now being labeled as having  "irritable larynx syndrome" by some cough centers) who not infrequently have a history of having failed to tolerate ace inhibitors,  dry powder inhalers or biphosphonates or report having atypical reflux symptoms that don't respond to standard doses of PPI , and are easily confused as having aecopd or asthma flares by even experienced allergists/ pulmonologists.   The first step is to maximize acid suppression and use diet to help with non acid gerd and f/u in one months

## 2014-01-24 ENCOUNTER — Telehealth: Payer: Self-pay | Admitting: Medical

## 2014-01-24 NOTE — Telephone Encounter (Signed)
Sent note to G. Earlene Plateravis with ok from [facility] to [facility] providers to Dr. Drue NovelPaz.

## 2014-01-24 NOTE — Telephone Encounter (Signed)
She can switch to Dr. Drue NovelPaz. This was at pt request.

## 2014-01-24 NOTE — Telephone Encounter (Signed)
Drue Novelaz, is this OK with you?

## 2014-01-24 NOTE — Telephone Encounter (Signed)
Caller name:Mankin Catherine Relation to QM:VHQIpt:self Call back [phone] Pharmacy:  Reason for call: pt is requesting to switch to dr.paz , states dr. Drue NovelPaz and informed her that it was ok with him . [place] from [place]

## 2014-01-24 NOTE — Telephone Encounter (Signed)
Ok w/ me 

## 2014-01-30 ENCOUNTER — Ambulatory Visit: Payer: Medicaid Other | Admitting: Internal Medicine

## 2014-01-30 ENCOUNTER — Ambulatory Visit: Payer: Medicaid Other | Admitting: Medical

## 2014-02-01 ENCOUNTER — Ambulatory Visit: Payer: Medicaid Other | Admitting: Internal Medicine

## 2014-02-12 ENCOUNTER — Ambulatory Visit: Payer: Medicaid Other | Admitting: Internal Medicine

## 2014-02-14 ENCOUNTER — Ambulatory Visit (INDEPENDENT_AMBULATORY_CARE_PROVIDER_SITE_OTHER): Payer: Medicaid Other | Admitting: Internal Medicine

## 2014-02-14 ENCOUNTER — Encounter: Payer: Self-pay | Admitting: Internal Medicine

## 2014-02-14 VITALS — BP 115/77 | HR 77 | Temp 98.3°F | Ht 66.0 in | Wt 258.1 lb

## 2014-02-14 DIAGNOSIS — K219 Gastro-esophageal reflux disease without esophagitis: Secondary | ICD-10-CM

## 2014-02-14 DIAGNOSIS — K2100 Gastro-esophageal reflux disease with esophagitis, without bleeding: Secondary | ICD-10-CM

## 2014-02-14 HISTORY — DX: Gastro-esophageal reflux disease without esophagitis: K21.9

## 2014-02-14 MED ORDER — OMEPRAZOLE 40 MG PO CPDR: 40.0000 mg | DELAYED_RELEASE_CAPSULE | Freq: Every day | ORAL | Status: DC

## 2014-02-14 MED ORDER — SUCRALFATE 1 GM/10ML PO SUSP: 1.0000 g | Freq: Three times a day (TID) | ORAL | Status: DC

## 2014-02-14 NOTE — Progress Notes (Signed)
Subjective:    Patient ID: Stephanie Davies, female    DOB: 1975/02/16, 39 y.o.   MRN: [id number]  DOS:  02/14/2014 Type of visit - description : f/u for his visit with me Interval history: Patient was having problems with cough and difficulty breathing, went to see pulmonary, they felt the problem was actually GI and prescribed Protonix and Pepcid. Cough is better but in the last 2 weeks is having chest pain: Had 2 episodes of severe, sharp, anterior chest pain, lasted 2  hours, symptoms follow-up by a sensation of "hot peppers" at the chest and epigastric area that may last many hours. Protonix and Pepcid not helping, in fact she feels that when she takes the Protonix the   pain is worse . She also reports pain when she swallows solids, trying to eat very soft foods with some relief.   Review of Systems Denies fever chills, no runny nose or sore throat No shortness of breath or lower extremity edema No diarrhea, blood in the stools. Denies any classic heartburn, her diet has not changed but she has gained weight. Cough is gone, minimal wheezing if any  Past Medical History  Diagnosis Date  . Alopecia   . Pseudotumor cerebri   . Headache(784.0)   . Hemorrhoids   . Kidney stone   . GERD (gastroesophageal reflux disease) 02/14/2014    Past Surgical History  Procedure Laterality Date  . Cholecystectomy  2004    History   Social History  . Marital Status: Married    Spouse Name: N/A    Number of Children: 1  . Years of Education: N/A   Occupational History  . stay home    Social History Main Topics  . Smoking status: Never Smoker   . Smokeless tobacco: Never Used  . Alcohol Use: No  . Drug Use: No  . Sexual Activity: Not Currently    Birth Control/ Protection: None   Other Topics Concern  . Not on file   Social History Narrative   Original from from [place]   Lives w/ mother, husband overseas        Medication List       This list is accurate as of: 02/14/14   3:22 PM.  Always use your most recent med list.               albuterol 108 (90 BASE) MCG/ACT inhaler  Commonly known as:  PROVENTIL HFA;VENTOLIN HFA  Inhale 2 puffs into the lungs every 6 (six) hours as needed for wheezing or shortness of breath.     omeprazole 40 MG capsule  Commonly known as:  PRILOSEC  Take 1 capsule (40 mg total) by mouth daily.     sucralfate 1 GM/10ML suspension  Commonly known as:  CARAFATE  Take 10 mLs (1 g total) by mouth 4 (four) times daily -  with meals and at bedtime.           Objective:   Physical Exam  Constitutional: She is oriented to person, place, and time. She appears well-developed. No distress.  Obese female  HENT:  Head: Normocephalic and atraumatic.  Cardiovascular:  RRR, no murmur , rub or gallop  Pulmonary/Chest: Effort normal. No respiratory distress.  CTA B  Abdominal: Soft. Bowel sounds are normal. She exhibits no distension.  Mild epigastric tenderness without mass or rebound. No organomegaly  Musculoskeletal: She exhibits no edema or tenderness.  Neurological: She is alert and oriented to person, place, and time. No  cranial nerve deficit. She exhibits normal muscle tone. Coordination normal.  Speech normal, gait unassisted and normal for age, motor strength appropriate for age   Skin: Skin is warm and dry. No pallor.  No jaundice  Psychiatric: She has a normal mood and affect. Her behavior is normal. Judgment and thought content normal.  Vitals reviewed.        Assessment & Plan:    Today , I spent more than  25  min with the patient: >50% of the time counseling regards GERD precautions, additional time spent reviewing the visit to pulmonary.   Problem List Items Addressed This Visit      Digestive   GERD (gastroesophageal reflux disease) - Primary    Suspect symptoms are related to GERD and possibly esophagitis. Not responding to Protonix or Pepcid. (I wonder if patient thinks protoni is making her worse  because she has pain with swallowing) Plan: Omeprazole 40 mg, OTC ranitidine, Carafate 4 times a day x 1 week, reassess in 2 weeks. Diet discussed If not better will need a GI       Relevant Medications   omeprazole (PRILOSEC) capsule   sucralfate (CARAFATE) 1 GM/10ML suspension

## 2014-02-14 NOTE — Assessment & Plan Note (Addendum)
Suspect symptoms are related to GERD and possibly esophagitis. Not responding to Protonix or Pepcid. (I wonder if patient thinks protoni is making her worse because she has pain with swallowing) Plan: Omeprazole 40 mg, OTC ranitidine, Carafate 4 times a day x 1 week, reassess in 2 weeks. Diet discussed If not better will need a GI

## 2014-02-14 NOTE — Progress Notes (Signed)
Pre visit review using our clinic review tool, if applicable. No additional management support is needed unless otherwise documented below in the visit note. 

## 2014-02-14 NOTE — Patient Instructions (Signed)
Omeprazole 1 tablet every morning before breakfast with fluids Take Carafate 1 hour before each meal and 1 hour before bedtime Take over-the-counter Zantac as needed  Follow a bland, soft diet. See below  If your symptoms get worse please call the office  Please come back in 2 or 3 weeks for a checkup  Food Choices for Gastroesophageal Reflux Disease When you have gastroesophageal reflux disease (GERD), the foods you eat and your eating habits are very important. Choosing the right foods can help ease the discomfort of GERD. WHAT GENERAL GUIDELINES DO I NEED TO FOLLOW?  Choose fruits, vegetables, whole grains, low-fat dairy products, and low-fat meat, fish, and poultry.  Limit fats such as oils, salad dressings, butter, nuts, and avocado.  Keep a food diary to identify foods that cause symptoms.  Avoid foods that cause reflux. These may be different for different people.  Eat frequent small meals instead of three large meals each day.  Eat your meals slowly, in a relaxed setting.  Limit fried foods.  Cook foods using methods other than frying.  Avoid drinking alcohol.  Avoid drinking large amounts of liquids with your meals.  Avoid bending over or lying down until 2-3 hours after eating. WHAT FOODS ARE NOT RECOMMENDED? The following are some foods and drinks that may worsen your symptoms: Vegetables Tomatoes. Tomato juice. Tomato and spaghetti sauce. Chili peppers. Onion and garlic. Horseradish. Fruits Oranges, grapefruit, and lemon (fruit and juice). Meats High-fat meats, fish, and poultry. This includes hot dogs, ribs, ham, sausage, salami, and bacon. Dairy Whole milk and chocolate milk. Sour cream. Cream. Butter. Ice cream. Cream cheese.  Beverages Coffee and tea, with or without caffeine. Carbonated beverages or energy drinks. Condiments Hot sauce. Barbecue sauce.  Sweets/Desserts Chocolate and cocoa. Donuts. Peppermint and spearmint. Fats and Oils High-fat  foods, including [place] fries and potato chips. Other Vinegar. Strong spices, such as black pepper, white pepper, red pepper, cayenne, curry powder, cloves, ginger, and chili powder. The items listed above may not be a complete list of foods and beverages to avoid. Contact your dietitian for more information. Document Released: 12/28/2004 Document Revised: 01/02/2013 Document Reviewed: 11/01/2012 [facility] Patient Information 2015 [employer]. This information is not intended to replace advice given to you by your health care provider. Make sure you discuss any questions you have with your health care provider.

## 2014-02-18 ENCOUNTER — Encounter: Payer: Self-pay | Admitting: Internal Medicine

## 2014-02-18 ENCOUNTER — Encounter (INDEPENDENT_AMBULATORY_CARE_PROVIDER_SITE_OTHER): Payer: Self-pay

## 2014-02-18 ENCOUNTER — Ambulatory Visit (INDEPENDENT_AMBULATORY_CARE_PROVIDER_SITE_OTHER): Payer: Medicaid Other | Admitting: Internal Medicine

## 2014-02-18 VITALS — BP 124/70 | HR 86 | Ht 66.0 in | Wt 258.0 lb

## 2014-02-18 DIAGNOSIS — R0600 Dyspnea, unspecified: Secondary | ICD-10-CM

## 2014-02-18 DIAGNOSIS — R058 Other specified cough: Secondary | ICD-10-CM

## 2014-02-18 DIAGNOSIS — R05 Cough: Secondary | ICD-10-CM

## 2014-02-18 NOTE — Progress Notes (Signed)
Quick Note:  LMTCB ______ 

## 2014-02-18 NOTE — Progress Notes (Signed)
Subjective:    Patient ID: Stephanie Davies, female    DOB: 07-05-1975   MRN: [id number]    Brief patient profile:  39 yo [place]ian female smoker immigrated in 1999 with first term baby Aug 2015  With onset during the last month of IUP= chest tight, sob and feels like rock on chest no change at all p baby born referred by Dr Stephanie Davies to pulmonary clinic 01/21/2014    History of Present Illness  01/21/2014 1st [facility] office visit/ Stephanie Davies   Chief Complaint  Patient presents with  . Pulmonary Consult    Referred by Stephanie Davies. [name]  She states "feels like a rock in my chest".    ex tol not back to normal since delivery with doe x more than slow adls Cough did not start until  early dec 2015 day > noct,  ? Better immediately p  Qvar, dry   Better if stays up >  30 degrees when supine Symptoms were are insidious, pattern is daily, noct, not really progressive but certainly persistent Has saba but never took out of box  Prednisone 10 mg take  4 each am x 2 days,   2 each am x 2 days,  1 each am x 2 days and stop  Stop qvar for now  Pantoprazole (protonix) 40 mg   Take 30-60 min before first meal of the day and Pepcid 20 mg one bedtime along with chlortrimeton 4 mg at bedtime until you return     02/18/2014 f/u ov/Stephanie Davies re: unexplained cp/sob/cough post partum, no longer nursing  Chief Complaint  Patient presents with  . Follow-up    pt c/o sharp chest tightness Xfew days, esophageal tightness.  still cannot lay flat.    cough came on p chest tightness and sob and is now completely gone x 2 weeks s need for cough meds  Breathing much better and rare if ever  need for saba Chest tightness gone New cp center of chest x two weeks prior to OV  Diffuse mostly midline rad to back and does not hurt to take deep breath/ no change with meals but much worse when trieds to lie flat > w/u underway by Dr Stephanie Davies   No obvious  day to day or daytime variabilty or assoc chronic cough  or chest tightness, subjective wheeze overt sinus or hb symptoms. No unusual exp hx or h/o childhood pna/ asthma or knowledge of premature birth.  Sleeping ok without nocturnal  or early am exacerbation  of respiratory  c/o's or need for noct saba. Also denies any obvious fluctuation of symptoms with weather or environmental changes or other aggravating or alleviating factors except as outlined above   Current Medications, Allergies, Complete Past Medical History, Past Surgical History, Family History, and Social History were reviewed in [facility] electronic medical record.  ROS  The following are not active complaints unless bolded sore throat, dysphagia, dental problems, itching, sneezing,  nasal congestion or excess/ purulent secretions, ear ache,   fever, chills, sweats, unintended wt loss, pleuritic or exertional cp, hemoptysis,  orthopnea pnd or leg swelling, presyncope, palpitations, heartburn, abdominal pain, anorexia, nausea, vomiting, diarrhea  or change in bowel or urinary habits, change in stools or urine, dysuria,hematuria,  rash, arthralgias, visual complaints, headache, numbness weakness or ataxia or problems with walking or coordination,  change in mood/affect or memory.  Objective:   Physical Exam   amb arabic female  nad  02/18/2014          258 Wt Readings from Last 3 Encounters:  01/21/14 254 lb (115.214 kg)  01/09/14 256 lb 9.6 oz (116.393 kg)  11/15/13 248 lb 12.8 oz (112.855 kg)    Vital signs reviewed   HEENT: nl dentition, turbinates, and orophanx. Nl external ear canals without cough reflex   NECK :  without JVD/Nodes/TM/ nl carotid upstrokes bilaterally   LUNGS: no acc muscle use, clear to A and P bilaterally without cough on insp or exp maneuvers   CV:  RRR  no s3 or murmur or increase in P2, no edema   ABD:  soft and nontender with nl excursion in the supine position. No bruits  or organomegaly, bowel sounds nl  MS:  warm without deformities, calf tenderness, cyanosis or clubbing  SKIN: warm and dry without lesions    NEURO:  alert, approp, no deficits    Cta 01/10/14 1. No evidence of pulmonary embolism. 2. 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

## 2014-02-18 NOTE — Patient Instructions (Signed)
No evidence of asthma   Follow Dr Haroldine LawsPaz'a instructions regarding treatment of your chest pains which certainly sound most consistent with reflux  Pulmonary follow up is as needed if you start needing more of your rescue inhaler

## 2014-02-19 ENCOUNTER — Encounter: Payer: Self-pay | Admitting: Internal Medicine

## 2014-02-19 ENCOUNTER — Telehealth: Payer: Self-pay | Admitting: Medical

## 2014-02-19 NOTE — Assessment & Plan Note (Signed)
Resolved off qvar so doubt cough variant asthma and more likely this was also related to GERD

## 2014-02-19 NOTE — Telephone Encounter (Signed)
Pt has an appointment with Dr. Beverely Lowabori tomorrow, 02/20/14, @ 11:30 am.

## 2014-02-19 NOTE — Telephone Encounter (Signed)
[facility] - Client TELEPHONE ADVICE RECORD [facility] Patient Name: Stephanie Davies DOB: [birth year] Initial Comment caller states she has blood in her stool and a rash on her legs only Nurse Assessment Nurse: Chrys RacerKoenig, RN, Alexia FreestoneAnna Marie Date/Time Lamount Cohen(Eastern Time): 02/19/2014 12:03:54 PM Confirm and document reason for call. If symptomatic, describe symptoms. ---caller states she has blood in her stool and a rash on her legs only. Blood in BM was isolated x1 episode last night, has hemorrhoids. Is currently constipated. Toilet water was dark pink and continues today. Bm's are painful and hard, in very small amounts. The rash is on both legs, backside only, from the knee up to top thigh. Not taking antibiotics, or been "sick" recently Has the patient traveled out of the country within the last 30 days? ---No Does the patient require triage? ---Yes Related visit to physician within the last 2 weeks? ---Yes Does the PT have any chronic conditions? (i.e. diabetes, asthma, etc.) ---Yes List chronic conditions. ---acid reflux/takes medicine every day for this Did the patient indicate they were pregnant? ---No Guidelines Guideline Title Affirmed Question Affirmed Notes Rectal Bleeding Rectal bleeding (Exceptions: blood just on toilet paper, few drops, streaks on surface of normal formed BM) Itching - Localized Mild localized itching (all triage questions negative) Final Disposition User See Physician within 24 Hours Chrys RacerKoenig, RN, Alexia Freestonenna Marie Comments Appt made with Dr Beverely Lowabori on wed 2/10 at 11:30

## 2014-02-19 NOTE — Assessment & Plan Note (Addendum)
02/18/2014  Walked RA x 3 laps @ 185 ft each stopped due to  End of study, no sob or desat, moderate pace  02/18/14 spirometry p walking completely wnl off all resp rx   Doing fine off all meds with cp most c/w poorly controlled gerd already being addressed by Dr Drue NovelPaz and no evidence at all of a pulmonary problem here so f/u can be prn

## 2014-02-20 ENCOUNTER — Encounter: Payer: Self-pay | Admitting: Family Medicine

## 2014-02-20 ENCOUNTER — Ambulatory Visit (INDEPENDENT_AMBULATORY_CARE_PROVIDER_SITE_OTHER): Payer: Medicaid Other | Admitting: Family Medicine

## 2014-02-20 VITALS — BP 126/86 | HR 78 | Temp 98.1°F | Resp 16 | Wt 258.5 lb

## 2014-02-20 DIAGNOSIS — K648 Other hemorrhoids: Secondary | ICD-10-CM

## 2014-02-20 DIAGNOSIS — K644 Residual hemorrhoidal skin tags: Secondary | ICD-10-CM

## 2014-02-20 DIAGNOSIS — L509 Urticaria, unspecified: Secondary | ICD-10-CM

## 2014-02-20 LAB — CBC WITH DIFFERENTIAL/PLATELET
Basophils Absolute: 0 10*3/uL (ref 0.0–0.1)
Basophils Relative: 0.5 % (ref 0.0–3.0)
Eosinophils Absolute: 0.1 10*3/uL (ref 0.0–0.7)
Eosinophils Relative: 1.1 % (ref 0.0–5.0)
HCT: 39.6 % (ref 36.0–46.0)
Hemoglobin: 13.1 g/dL (ref 12.0–15.0)
Lymphocytes Relative: 22.6 % (ref 12.0–46.0)
Lymphs Abs: 1.6 10*3/uL (ref 0.7–4.0)
MCHC: 33.1 g/dL (ref 30.0–36.0)
MCV: 77 fl — ABNORMAL LOW (ref 78.0–100.0)
Monocytes Absolute: 0.3 10*3/uL (ref 0.1–1.0)
Monocytes Relative: 4.8 % (ref 3.0–12.0)
Neutro Abs: 5 10*3/uL (ref 1.4–7.7)
Neutrophils Relative %: 71 % (ref 43.0–77.0)
Platelets: 434 10*3/uL — ABNORMAL HIGH (ref 150.0–400.0)
RBC: 5.14 Mil/uL — ABNORMAL HIGH (ref 3.87–5.11)
RDW: 16.8 % — ABNORMAL HIGH (ref 11.5–15.5)
WBC: 7.1 10*3/uL (ref 4.0–10.5)

## 2014-02-20 MED ORDER — HYDROCORTISONE ACE-PRAMOXINE 2.5-1 % RE CREA: 1.0000 "application " | TOPICAL_CREAM | Freq: Three times a day (TID) | RECTAL | Status: DC

## 2014-02-20 MED ORDER — DIBUCAINE 1 % RE OINT: 1.0000 "application " | TOPICAL_OINTMENT | RECTAL | Status: DC | PRN

## 2014-02-20 MED ORDER — CETIRIZINE HCL 10 MG PO TABS: 10.0000 mg | ORAL_TABLET | Freq: Every day | ORAL | Status: DC

## 2014-02-20 MED ORDER — TRIAMCINOLONE ACETONIDE 0.1 % EX OINT: 1.0000 "application " | TOPICAL_OINTMENT | Freq: Two times a day (BID) | CUTANEOUS | Status: DC

## 2014-02-20 NOTE — Assessment & Plan Note (Signed)
New.  Pt denies any changes in soaps/lotions/detergents.  Admits to high levels of stress.  Will start daily Zyrtec to prevent hives.  Pt to take Benadryl as needed if hives occur.  Use Triamcinolone ointment prn for itching.  Marland Kitchen.get CBC to assess Eosinophil level in blood.  Reviewed supportive care and red flags that should prompt return.  Pt expressed understanding and is in agreement w/ plan.

## 2014-02-20 NOTE — Progress Notes (Signed)
   Subjective:    Patient ID: Stephanie Davies, female    DOB: 28-Feb-1975, 39 y.o.   MRN: [id number]  HPI Rash- 3 weeks ago, started behind knee and then spread to entirity of both legs.  Pt reports sxs will resolve in the AM, but then will develop stinging 'bee like' sensations and intense itching on both legs.  Pt has pictures of rash- hives present.  Denies changes to detergents, lotions, soaps.  Pt admits to high levels of stress.    Rectal bleeding- 3 days ago was constipated, 2 days ago had diarrhea and BRBPR.  Now having painful bleeding w/ BMs.  Pt has hx of hemorrhoids.     Review of Systems For ROS see HPI     Objective:   Physical Exam  Constitutional: She is oriented to person, place, and time. She appears well-developed and well-nourished. No distress.  obese  Genitourinary: Rectal exam shows external hemorrhoid (large painful external hemorrhoid) and tenderness. Rectal exam shows no internal hemorrhoid and no fissure. Guaiac negative stool.  Neurological: She is alert and oriented to person, place, and time.  Skin: Skin is warm and dry. Rash (scattered hives on LEs bilaterally) noted.  Vitals reviewed.         Assessment & Plan:

## 2014-02-20 NOTE — Patient Instructions (Signed)
Follow up as needed We'll notify you of your lab results and make any changes if needed The pain and bleeding is due to hemorrhoids Use the Analpram to treat the hemorrhoid Use the Nupercainal as needed for pain Start the Zyrtec daily to prevent the hives Use Benadryl as needed if hives develop Use the Triamcinolone ointment as needed for itching Call with any questions or concerns Hang in there!!!

## 2014-02-20 NOTE — Assessment & Plan Note (Signed)
New.  Pt w/ obvious, tender external hemorrhoid.  Heme (-) stool.  Start Analpram, Nupercainal to tx sxs.  Reviewed supportive care and red flags that should prompt return.  Pt expressed understanding and is in agreement w/ plan.

## 2014-02-21 NOTE — Progress Notes (Signed)
Quick Note:  lmtcb for pt. ______ 

## 2014-02-22 ENCOUNTER — Ambulatory Visit: Payer: Medicaid Other | Admitting: Internal Medicine

## 2014-02-27 ENCOUNTER — Telehealth: Payer: Self-pay | Admitting: Internal Medicine

## 2014-02-27 NOTE — Telephone Encounter (Signed)
Notes Recorded by Nyoka CowdenMichael B Wert, MD on 02/18/2014 at 1:31 PM Call patient : Study is unremarkable, no change in recs -- Pt is aware of Stephanie DaubSpiro results.

## 2014-03-07 ENCOUNTER — Ambulatory Visit (INDEPENDENT_AMBULATORY_CARE_PROVIDER_SITE_OTHER): Payer: Medicaid Other | Admitting: Internal Medicine

## 2014-03-07 ENCOUNTER — Encounter: Payer: Self-pay | Admitting: Internal Medicine

## 2014-03-07 VITALS — BP 110/64 | HR 86 | Temp 98.0°F | Ht 66.0 in | Wt 259.4 lb

## 2014-03-07 DIAGNOSIS — F32A Depression, unspecified: Secondary | ICD-10-CM

## 2014-03-07 DIAGNOSIS — E049 Nontoxic goiter, unspecified: Secondary | ICD-10-CM

## 2014-03-07 DIAGNOSIS — F418 Other specified anxiety disorders: Secondary | ICD-10-CM

## 2014-03-07 DIAGNOSIS — K219 Gastro-esophageal reflux disease without esophagitis: Secondary | ICD-10-CM

## 2014-03-07 DIAGNOSIS — F329 Major depressive disorder, single episode, unspecified: Secondary | ICD-10-CM | POA: Insufficient documentation

## 2014-03-07 DIAGNOSIS — R0600 Dyspnea, unspecified: Secondary | ICD-10-CM

## 2014-03-07 DIAGNOSIS — F419 Anxiety disorder, unspecified: Secondary | ICD-10-CM

## 2014-03-07 HISTORY — DX: Depression, unspecified: F32.A

## 2014-03-07 HISTORY — DX: Anxiety disorder, unspecified: F41.9

## 2014-03-07 HISTORY — DX: Nontoxic goiter, unspecified: E04.9

## 2014-03-07 MED ORDER — ESCITALOPRAM OXALATE 10 MG PO TABS: 10.0000 mg | ORAL_TABLET | Freq: Every day | ORAL | Status: DC

## 2014-03-07 NOTE — Assessment & Plan Note (Signed)
Goiter, found on CT few weeks ago, schedule ultrasound

## 2014-03-07 NOTE — Progress Notes (Signed)
Pre visit review using our clinic review tool, if applicable. No additional management support is needed unless otherwise documented below in the visit note. 

## 2014-03-07 NOTE — Progress Notes (Signed)
Subjective:    Patient ID: Stephanie Davies, female    DOB: Jun 05, 1975, 39 y.o.   MRN: [id number]  DOS:  03/07/2014 Type of visit - description : f/u  Interval history: GERD, good compliance with medication. States that the epigastric "burning feeling" is better however she still has occasional chest pain usually associated to stress. Not associated with swallowing food. Note from pulmonary reviewed, DOE not felt to be pulmonary related, they think could be GERD. Also CT done at the ER 01/09/2014 reviewed, thyroid gland was enlarged, needs ultrasound which will be arranged. The patient mentioned "stress" several times, on further questioning she reports anxiety and some degree of depression. Symptoms related to family issues. She also had her first baby about 6 months ago.    Review of Systems Denies lower extremity edema, still gets occasionally short of breath. No nausea, vomiting, diarrhea or further blood in the stools (was seen recently with hemorrhoids) No suicidal ideas  Past Medical History  Diagnosis Date  . Alopecia   . Pseudotumor cerebri   . Headache(784.0)   . Hemorrhoids   . Kidney stone   . GERD (gastroesophageal reflux disease) 02/14/2014    Past Surgical History  Procedure Laterality Date  . Cholecystectomy  2004    History   Social History  . Marital Status: Married    Spouse Name: N/A  . Number of Children: 1  . Years of Education: N/A   Occupational History  . stay home    Social History Main Topics  . Smoking status: Never Smoker   . Smokeless tobacco: Never Used  . Alcohol Use: No  . Drug Use: No  . Sexual Activity: Not Currently    Birth Control/ Protection: None   Other Topics Concern  . Not on file   Social History Narrative   Original from from [place]   Lives w/ mother, husband overseas        Medication List       This list is accurate as of: 03/07/14  5:35 PM.  Always use your most recent med list.               albuterol  108 (90 BASE) MCG/ACT inhaler  Commonly known as:  PROVENTIL HFA;VENTOLIN HFA  Inhale 2 puffs into the lungs every 6 (six) hours as needed for wheezing or shortness of breath.     cetirizine 10 MG tablet  Commonly known as:  ZYRTEC  Take 1 tablet (10 mg total) by mouth daily.     dibucaine 1 % Oint  Commonly known as:  NUPERCAINAL  Place 1 application rectally as needed for pain.     escitalopram 10 MG tablet  Commonly known as:  LEXAPRO  Take 1 tablet (10 mg total) by mouth daily.     hydrocortisone-pramoxine 2.5-1 % rectal cream  Commonly known as:  ANALPRAM HC  Place 1 application rectally 3 (three) times daily.     omeprazole 40 MG capsule  Commonly known as:  PRILOSEC  Take 40 mg by mouth daily.     triamcinolone ointment 0.1 %  Commonly known as:  KENALOG  Apply 1 application topically 2 (two) times daily.           Objective:   Physical Exam BP 110/64 mmHg  Pulse 86  Temp(Src) 98 F (36.7 C) (Oral)  Ht  (1.676 m)  Wt 259 lb 6 oz (117.652 kg)  BMI 41.88 kg/m2  SpO2 97%  LMP 02/13/2014  General:  Well developed, well nourished . NAD.  HEENT:  Normocephalic . Face symmetric, atraumatic Neurologic:  alert & oriented X3.  Speech normal, gait appropriate for age and unassisted Psych--  Cognition and judgment appear intact.  Cooperative with normal attention span and concentration.  Behavior appropriate. No anxious or depressed appearing.       Assessment & Plan:

## 2014-03-07 NOTE — Patient Instructions (Signed)
Stop Carafate, continue omeprazole  Start Lexapro 1 tablet every night  See a counselor  Come back in one month

## 2014-03-07 NOTE — Assessment & Plan Note (Signed)
GERD, Good compliance with medications, epigastric pain stopped, still has occasional chest pain. Plan: Discontinue sucralfate, continue with omeprazole. Reassess in one month; again we'll have to consider GI consult

## 2014-03-07 NOTE — Assessment & Plan Note (Signed)
Dyspnea on exertion, pulmonary workup negative, likely related to GERD

## 2014-03-07 NOTE — Assessment & Plan Note (Signed)
Anxiety depression, On further questioning today, the patient is feeling anxious and somewhat depressed. Denies suicidal ideas. This is not the first time she feels that way,   has taken medication for these symptoms before. Plan: Strongly encouraged to see a counselor, information provided Start Lexapro, side effects including suicidality discussed. Follow-up one month Also counselor here today.

## 2014-03-12 ENCOUNTER — Other Ambulatory Visit (HOSPITAL_BASED_OUTPATIENT_CLINIC_OR_DEPARTMENT_OTHER): Payer: Medicaid Other

## 2014-03-15 ENCOUNTER — Ambulatory Visit (HOSPITAL_BASED_OUTPATIENT_CLINIC_OR_DEPARTMENT_OTHER): Payer: Medicaid Other

## 2014-04-01 ENCOUNTER — Ambulatory Visit (HOSPITAL_BASED_OUTPATIENT_CLINIC_OR_DEPARTMENT_OTHER)
Admission: RE | Admit: 2014-04-01 | Discharge: 2014-04-01 | Disposition: A | Discharge status: Home / Self Care | Payer: Medicaid Other | Source: Ambulatory Visit | Attending: Internal Medicine | Admitting: Internal Medicine

## 2014-04-01 DIAGNOSIS — E049 Nontoxic goiter, unspecified: Secondary | ICD-10-CM

## 2014-04-01 DIAGNOSIS — E041 Nontoxic single thyroid nodule: Secondary | ICD-10-CM | POA: Diagnosis present

## 2014-04-01 DIAGNOSIS — E042 Nontoxic multinodular goiter: Secondary | ICD-10-CM | POA: Diagnosis not present

## 2014-04-02 ENCOUNTER — Encounter: Payer: Self-pay | Admitting: Internal Medicine

## 2014-04-02 ENCOUNTER — Ambulatory Visit (INDEPENDENT_AMBULATORY_CARE_PROVIDER_SITE_OTHER): Payer: Medicaid Other | Admitting: Internal Medicine

## 2014-04-02 VITALS — BP 124/62 | HR 84 | Temp 98.0°F | Ht 66.0 in | Wt 263.0 lb

## 2014-04-02 DIAGNOSIS — F419 Anxiety disorder, unspecified: Secondary | ICD-10-CM

## 2014-04-02 DIAGNOSIS — E049 Nontoxic goiter, unspecified: Secondary | ICD-10-CM

## 2014-04-02 DIAGNOSIS — F329 Major depressive disorder, single episode, unspecified: Secondary | ICD-10-CM

## 2014-04-02 DIAGNOSIS — F418 Other specified anxiety disorders: Secondary | ICD-10-CM

## 2014-04-02 DIAGNOSIS — K219 Gastro-esophageal reflux disease without esophagitis: Secondary | ICD-10-CM

## 2014-04-02 DIAGNOSIS — L509 Urticaria, unspecified: Secondary | ICD-10-CM

## 2014-04-02 DIAGNOSIS — F32A Depression, unspecified: Secondary | ICD-10-CM

## 2014-04-02 LAB — TSH: TSH: 2.41 u[IU]/mL (ref 0.35–4.50)

## 2014-04-02 LAB — T4, FREE: Free T4: 0.75 ng/dL (ref 0.60–1.60)

## 2014-04-02 LAB — T3, FREE: T3, Free: 2.9 pg/mL (ref 2.3–4.2)

## 2014-04-02 NOTE — Assessment & Plan Note (Signed)
Ultrasound is confirmatory for goiter, nodules meet criteria for biopsy. Plan: Refer for biopsy, check TFTs

## 2014-04-02 NOTE — Progress Notes (Signed)
Subjective:    Patient ID: Stephanie Davies, female    DOB: 02/09/1975, 39 y.o.   MRN: [id number]  DOS:  04/02/2014 Type of visit - description :  Here for a follow-up Interval history: Anxiety, on Lexapro, medication is helping. GERD, good compliance with omeprazole, still having symptoms at night described as a hot/pain sensation at the anterior chest, mostly when she lays down. Recently found to have a large thyroid gland on a CT, had a ultrasound yesterday, report reviewed with the patient Hives, still an issue despite taking Zyrtec   Review of Systems  Denies fever chills No tongue  or throat swelling. No joint aches. No actual dysphagia or odynophagia   Past Medical History  Diagnosis Date  . Alopecia   . Pseudotumor cerebri   . Headache(784.0)   . Hemorrhoids   . Kidney stone   . GERD (gastroesophageal reflux disease) 02/14/2014  . Anxiety and depression 03/07/2014  . Goiter 03/07/2014    Past Surgical History  Procedure Laterality Date  . Cholecystectomy  2004    History   Social History  . Marital Status: Married    Spouse Name: N/A  . Number of Children: 1  . Years of Education: N/A   Occupational History  . stay home    Social History Main Topics  . Smoking status: Never Smoker   . Smokeless tobacco: Never Used  . Alcohol Use: No  . Drug Use: No  . Sexual Activity: Not Currently    Birth Control/ Protection: None   Other Topics Concern  . Not on file   Social History Narrative   Original from from [place]   Lives w/ mother, husband overseas        Medication List       This list is accurate as of: 04/02/14  6:37 PM.  Always use your most recent med list.               albuterol 108 (90 BASE) MCG/ACT inhaler  Commonly known as:  PROVENTIL HFA;VENTOLIN HFA  Inhale 2 puffs into the lungs every 6 (six) hours as needed for wheezing or shortness of breath.     cetirizine 10 MG tablet  Commonly known as:  ZYRTEC  Take 1 tablet (10 mg total)  by mouth daily.     dibucaine 1 % Oint  Commonly known as:  NUPERCAINAL  Place 1 application rectally as needed for pain.     escitalopram 10 MG tablet  Commonly known as:  LEXAPRO  Take 1 tablet (10 mg total) by mouth daily.     hydrocortisone-pramoxine 2.5-1 % rectal cream  Commonly known as:  ANALPRAM HC  Place 1 application rectally 3 (three) times daily.     omeprazole 40 MG capsule  Commonly known as:  PRILOSEC  Take 40 mg by mouth daily.     triamcinolone ointment 0.1 %  Commonly known as:  KENALOG  Apply 1 application topically 2 (two) times daily.           Objective:   Physical Exam BP 124/62 mmHg  Pulse 84  Temp(Src) 98 F (36.7 C) (Oral)  Ht  (1.676 m)  Wt 263 lb (119.296 kg)  BMI 42.47 kg/m2  SpO2 99%  LMP 03/11/2014 General:   Well developed, well nourished . NAD.  HEENT:  Normocephalic . Face symmetric, atraumatic Neck: Thyroid gland is enlarged, nontender, larger on the left, not nodular. No lymphadenopathies. Skin: Not pale. Not jaundice Neurologic:  alert &  oriented X3.  Speech normal, gait appropriate for age and unassisted Psych--  Cognition and judgment appear intact.  Cooperative with normal attention span and concentration.  Behavior appropriate. Slightly less anxious than before, not depressed appearing.  .        Assessment & Plan:

## 2014-04-02 NOTE — Patient Instructions (Signed)
Get your blood work before you leave   Come back to the office in 2 months   for a routine check up   

## 2014-04-02 NOTE — Progress Notes (Signed)
Pre visit review using our clinic review tool, if applicable. No additional management support is needed unless otherwise documented below in the visit note. 

## 2014-04-02 NOTE — Assessment & Plan Note (Addendum)
Lexapro is well tolerated, patient is improving, sx not completely controlled; instead of increase the dose of Lexapro I recommend to continue with the same dose 10 mg and seek counseling. Reassess in 2 months.

## 2014-04-02 NOTE — Assessment & Plan Note (Signed)
Ongoing symptoms despite Zyrtec, refer to allergy

## 2014-04-02 NOTE — Assessment & Plan Note (Signed)
Ongoing symptoms despite taking omeprazole in the morning on an empty stomach. Refer to GI

## 2014-04-03 ENCOUNTER — Encounter: Payer: Self-pay | Admitting: Gastroenterology

## 2014-04-03 ENCOUNTER — Telehealth: Payer: Self-pay | Admitting: *Deleted

## 2014-04-03 DIAGNOSIS — E049 Nontoxic goiter, unspecified: Secondary | ICD-10-CM

## 2014-04-03 NOTE — Telephone Encounter (Signed)
[facility] called stating they needed specific instructions for which thyroid nodule to perform biopsy on.  Verbal orders from Dr. Drue NovelPaz regarding which nodules.  US biopsy reordered.

## 2014-04-08 ENCOUNTER — Ambulatory Visit: Payer: Medicaid Other | Admitting: Internal Medicine

## 2014-04-22 ENCOUNTER — Encounter: Payer: Self-pay | Admitting: Gastroenterology

## 2014-04-22 ENCOUNTER — Ambulatory Visit (INDEPENDENT_AMBULATORY_CARE_PROVIDER_SITE_OTHER): Payer: Medicaid Other | Admitting: Gastroenterology

## 2014-04-22 ENCOUNTER — Other Ambulatory Visit: Payer: Self-pay | Admitting: Gastroenterology

## 2014-04-22 ENCOUNTER — Other Ambulatory Visit (INDEPENDENT_AMBULATORY_CARE_PROVIDER_SITE_OTHER): Payer: Medicaid Other

## 2014-04-22 VITALS — BP 118/58 | HR 76 | Ht 66.75 in | Wt 262.1 lb

## 2014-04-22 DIAGNOSIS — R109 Unspecified abdominal pain: Secondary | ICD-10-CM

## 2014-04-22 DIAGNOSIS — R1013 Epigastric pain: Secondary | ICD-10-CM

## 2014-04-22 LAB — CBC WITH DIFFERENTIAL/PLATELET
Basophils Absolute: 0.1 10*3/uL (ref 0.0–0.1)
Basophils Relative: 0.5 % (ref 0.0–3.0)
Eosinophils Absolute: 0.1 10*3/uL (ref 0.0–0.7)
Eosinophils Relative: 0.9 % (ref 0.0–5.0)
HCT: 37.2 % (ref 36.0–46.0)
Hemoglobin: 12.5 g/dL (ref 12.0–15.0)
Lymphocytes Relative: 15.3 % (ref 12.0–46.0)
Lymphs Abs: 1.7 10*3/uL (ref 0.7–4.0)
MCHC: 33.5 g/dL (ref 30.0–36.0)
MCV: 75.6 fl — ABNORMAL LOW (ref 78.0–100.0)
Monocytes Absolute: 0.4 10*3/uL (ref 0.1–1.0)
Monocytes Relative: 3.9 % (ref 3.0–12.0)
Neutro Abs: 8.6 10*3/uL — ABNORMAL HIGH (ref 1.4–7.7)
Neutrophils Relative %: 79.4 % — ABNORMAL HIGH (ref 43.0–77.0)
Platelets: 403 10*3/uL — ABNORMAL HIGH (ref 150.0–400.0)
RBC: 4.92 Mil/uL (ref 3.87–5.11)
RDW: 16.2 % — ABNORMAL HIGH (ref 11.5–15.5)
WBC: 10.8 10*3/uL — ABNORMAL HIGH (ref 4.0–10.5)

## 2014-04-22 LAB — HEPATIC FUNCTION PANEL
ALT: 34 U/L (ref 0–35)
AST: 20 U/L (ref 0–37)
Albumin: 3.7 g/dL (ref 3.5–5.2)
Alkaline Phosphatase: 108 U/L (ref 39–117)
Bilirubin, Direct: 0.1 mg/dL (ref 0.0–0.3)
Total Bilirubin: 0.3 mg/dL (ref 0.2–1.2)
Total Protein: 6.8 g/dL (ref 6.0–8.3)

## 2014-04-22 LAB — AMYLASE: Amylase: 43 U/L (ref 27–131)

## 2014-04-22 LAB — LIPASE: Lipase: 11 U/L (ref 11.0–59.0)

## 2014-04-22 MED ORDER — HYOSCYAMINE SULFATE ER 0.375 MG PO TBCR: EXTENDED_RELEASE_TABLET | ORAL | Status: DC

## 2014-04-22 NOTE — Assessment & Plan Note (Signed)
Three-month history of upper epigastric pain with radiation to the back suggest biliary tract pain although the patient does not have a gallbladder.  Choledocholithiasis is a consideration.  Doubt active peptic ulcer disease in the face of therapy with omeprazole.  Recommendations #1 check LFTs abdominal ultrasound #2 trial of hyomax #3 if studies are negative and she is not improved with hyomax may consider upper endoscopy

## 2014-04-22 NOTE — Patient Instructions (Signed)
Go to the basement today fo rlabs  You have been scheduled for an abdominal ultrasound at [facility] (1st floor of hospital) on 04/25/2014 at 10am. Please arrive 15 minutes prior to your appointment for registration. Make certain not to have anything to eat or drink 6 hours prior to your appointment. Should you need to reschedule your appointment, please contact radiology at [phone]. This test typically takes about 30 minutes to perform.

## 2014-04-22 NOTE — Progress Notes (Signed)
_                                                                                                                History of Present Illness:  Ms. Stephanie Davies  is a 39 year old female referred at the request of Dr. Drue NovelPaz for evaluation of abdominal pain.  For the past 3 months she's had fairly constant upper midepigastric pain that radiates to the back.  She frequently develops this early in the morning and pain will last throughout the day.  Pain is unaffected by eating.  Symptoms have not improved with omeprazole.  Denies pyrosis, nausea or change in bowel habits.  She status post cholecystectomy over 10 years ago.  Past Medical History  Diagnosis Date  . Alopecia   . Pseudotumor cerebri   . Headache(784.0)   . Hemorrhoids   . Kidney stone   . GERD (gastroesophageal reflux disease) 02/14/2014  . Anxiety and depression 03/07/2014  . Goiter 03/07/2014   Past Surgical History  Procedure Laterality Date  . Cholecystectomy  2004   family history includes Diabetes in her maternal grandfather and mother; Esophageal cancer in her maternal grandfather and maternal grandmother; Hypertension in her mother. There is no history of Colon cancer, Colon polyps, Gallbladder disease, or Kidney disease. Current Outpatient Prescriptions  Medication Sig Dispense Refill  . albuterol (PROVENTIL HFA;VENTOLIN HFA) 108 (90 BASE) MCG/ACT inhaler Inhale 2 puffs into the lungs every 6 (six) hours as needed for wheezing or shortness of breath. 1 Inhaler 0  . cetirizine (ZYRTEC) 10 MG tablet Take 1 tablet (10 mg total) by mouth daily. 30 tablet 11  . dibucaine (NUPERCAINAL) 1 % OINT Place 1 application rectally as needed for pain. 56.7 g 3  . escitalopram (LEXAPRO) 10 MG tablet Take 1 tablet (10 mg total) by mouth daily. 30 tablet 1  . hydrocortisone-pramoxine (ANALPRAM HC) 2.5-1 % rectal cream Place 1 application rectally 3 (three) times daily. (Patient taking differently: Place 1 application rectally as  needed. ) 30 g 3  . omeprazole (PRILOSEC) 40 MG capsule Take 40 mg by mouth daily.    Marland Kitchen. triamcinolone ointment (KENALOG) 0.1 % Apply 1 application topically 2 (two) times daily. 90 g 1   No current facility-administered medications for this visit.   Allergies as of 04/22/2014  . (No Known Allergies)    reports that she has never smoked. She has never used smokeless tobacco. She reports that she does not drink alcohol or use illicit drugs.   Review of Systems: Pertinent positive and negative review of systems were noted in the above HPI section. All other review of systems were otherwise negative.  Vital signs were reviewed in today's medical record Physical Exam: General: Well developed , well nourished, no acute distress Skin: anicteric Head: Normocephalic and atraumatic Eyes:  sclerae anicteric, EOMI Ears: Normal auditory acuity Mouth: No deformity or lesions Neck: Supple, no masses or thyromegaly Lungs: Clear throughout to auscultation Heart: Regular rate and rhythm; no murmurs, rubs or bruits Abdomen: Soft, and  non distended. No masses, hepatosplenomegaly or hernias noted. Normal Bowel sounds.  All tenderness to palpation in the midepigastrium and subxiphoid areas Rectal:deferred Musculoskeletal: Symmetrical with no gross deformities  Skin: No lesions on visible extremities Pulses:  Normal pulses noted Extremities: No clubbing, cyanosis, edema or deformities noted Neurological: Alert oriented x 4, grossly nonfocal Cervical Nodes:  No significant cervical adenopathy Inguinal Nodes: No significant inguinal adenopathy Psychological:  Alert and cooperative. Normal mood and affect  See Assessment and Plan under Problem List

## 2014-04-25 ENCOUNTER — Ambulatory Visit (HOSPITAL_COMMUNITY): Payer: Medicaid Other

## 2014-04-26 ENCOUNTER — Ambulatory Visit (HOSPITAL_COMMUNITY): Payer: Medicaid Other

## 2014-07-04 ENCOUNTER — Encounter: Payer: Self-pay | Admitting: Internal Medicine

## 2014-08-05 ENCOUNTER — Ambulatory Visit: Payer: Medicaid Other | Admitting: Internal Medicine

## 2014-08-13 ENCOUNTER — Ambulatory Visit: Payer: Medicaid Other | Admitting: Internal Medicine

## 2014-08-19 ENCOUNTER — Ambulatory Visit: Payer: Medicaid Other | Admitting: Internal Medicine

## 2014-08-21 ENCOUNTER — Telehealth: Payer: Self-pay | Admitting: Internal Medicine

## 2014-08-21 NOTE — Telephone Encounter (Signed)
Please advise 

## 2014-08-21 NOTE — Telephone Encounter (Signed)
I don't see any contraindication for her to go back to school. Please write a letter. Also, she was supposed to have a thyroid biopsy, was that ever done? If not please schedule

## 2014-08-21 NOTE — Telephone Encounter (Signed)
Please inform Pt that I have placed a letter up front for her to pick up at her convenience as well as her immunization record that we have. Pt will more than likely have college forms to complete at some point by Dr. Drue Novel and other immunizations. She will need a 30 minute appt and she will need to bring her immunizations records with her if she has had any elsewhere. Thank you.

## 2014-08-21 NOTE — Telephone Encounter (Signed)
Called pt and informed the below, pt will pick up today

## 2014-08-21 NOTE — Telephone Encounter (Signed)
Caller name: Aliviyah Relation to pt: self Call back number: [phone] Pharmacy:  Reason for call: Pt came in office stating needs a letter from her primary doctor saying that it is ok for her to return to school, pt states needs it ASAP to be able to start school, if possible today 08-21-14. Please advise.

## 2014-08-27 ENCOUNTER — Ambulatory Visit: Payer: Medicaid Other | Admitting: Internal Medicine

## 2014-09-20 ENCOUNTER — Ambulatory Visit (INDEPENDENT_AMBULATORY_CARE_PROVIDER_SITE_OTHER): Payer: Medicaid Other | Admitting: Internal Medicine

## 2014-09-20 ENCOUNTER — Encounter: Payer: Self-pay | Admitting: Internal Medicine

## 2014-09-20 VITALS — BP 116/74 | HR 73 | Temp 98.4°F | Ht 67.0 in | Wt 260.4 lb

## 2014-09-20 DIAGNOSIS — F418 Other specified anxiety disorders: Secondary | ICD-10-CM | POA: Diagnosis not present

## 2014-09-20 DIAGNOSIS — K2100 Gastro-esophageal reflux disease with esophagitis, without bleeding: Secondary | ICD-10-CM

## 2014-09-20 DIAGNOSIS — F419 Anxiety disorder, unspecified: Principal | ICD-10-CM

## 2014-09-20 DIAGNOSIS — F329 Major depressive disorder, single episode, unspecified: Secondary | ICD-10-CM

## 2014-09-20 DIAGNOSIS — E049 Nontoxic goiter, unspecified: Secondary | ICD-10-CM

## 2014-09-20 DIAGNOSIS — L509 Urticaria, unspecified: Secondary | ICD-10-CM | POA: Diagnosis not present

## 2014-09-20 DIAGNOSIS — F32A Depression, unspecified: Secondary | ICD-10-CM

## 2014-09-20 DIAGNOSIS — Z09 Encounter for follow-up examination after completed treatment for conditions other than malignant neoplasm: Secondary | ICD-10-CM

## 2014-09-20 MED ORDER — FLUOXETINE HCL 20 MG PO TABS: 30.0000 mg | ORAL_TABLET | Freq: Every day | ORAL | Status: DC

## 2014-09-20 NOTE — Patient Instructions (Addendum)
Take fluoxetine 20 mg: First week take half tablet daily Second week take 1 tablet daily Then take 1.5 tablets every day  For knee pain: Knee sleeves IBUPROFEN (Advil or Motrin) 200 mg 2 tablets every 6 hours as needed for pain.  Always take it with food because may cause gastritis and ulcers.  If you notice nausea, stomach pain, change in the color of stools --->  Stop the medicine and let us know   Takes Zyrtec daily and call your allergist     Next visit  for a  routine checkup in 2 months    (3o minutes ) Please schedule an appointment at the front desk No need to come back fasting

## 2014-09-20 NOTE — Assessment & Plan Note (Signed)
Anxiety: Took Lexapro 10 mg without much help, make her slightly sleepy. Plan: Change to Prozac, target dose 30 mg. See instructions. Has not been able to see a counselor, she will try again at college. Knee pain: Bilateral knee pain, exam is unremarkable, pain is mostly anterior, I believe her obesity is playing a major role. Recommend weight loss, knee sleeves and  use of Motrin judiciously. Hives: Was referred to the allergist but has not seen them yet. She plans to call them. Recommend antihistaminic daily until then GERD, dyspepsia: Saw GI, they recommend ultrasound and labs. Ultrasound was not done, will reschedule. Also recommend to die Hyoscyamine. Goiter: Was recommend that thyroid biopsy but she has not proceed, will reschedule

## 2014-09-20 NOTE — Progress Notes (Signed)
Pre visit review using our clinic review tool, if applicable. No additional management support is needed unless otherwise documented below in the visit note. 

## 2014-09-20 NOTE — Progress Notes (Signed)
Subjective:    Patient ID: Stephanie Davies, female    DOB: March 25, 1975, 39 y.o.   MRN: [id number]  DOS:  09/20/2014 Type of visit - description : Routine visit, multiple issues Interval history: [place] and unable to proceed with some of the tests we recommended.  Long history of bilateral knee pain without swelling, mostly when she goes upstairs or standing up from a chair.  GERD: Saw GI, labs were essentially normal, they rx a ultrasound but it wasn't done. Anxiety: Was taking Lexapro 10 mg, symptoms were not well controlled, still having occasional panic attacks. Did feel slightly fatigued taking it.   Review of Systems Not on birth control pills, last period ~ 3 weeks ago; husband is overseas, she is not sexually active. Not breast-feeding.  Past Medical History  Diagnosis Date  . Alopecia   . Pseudotumor cerebri   . Headache(784.0)   . Hemorrhoids   . Kidney stone   . GERD (gastroesophageal reflux disease) 02/14/2014  . Anxiety and depression 03/07/2014  . Goiter 03/07/2014    Past Surgical History  Procedure Laterality Date  . Cholecystectomy  2004    Social History   Social History  . Marital Status: Married    Spouse Name: N/A  . Number of Children: 1  . Years of Education: N/A   Occupational History  . stay home    Social History Main Topics  . Smoking status: Never Smoker   . Smokeless tobacco: Never Used  . Alcohol Use: No  . Drug Use: No  . Sexual Activity: Not Currently    Birth Control/ Protection: None   Other Topics Concern  . 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  Always use your most recent med list.               albuterol 108 (90 BASE) MCG/ACT inhaler  Commonly known as:  PROVENTIL HFA;VENTOLIN HFA  Inhale 2 puffs  into the lungs every 6 (six) hours as needed for wheezing or shortness of breath.     cetirizine 10 MG tablet  Commonly known as:  ZYRTEC  Take 1 tablet (10 mg total) by mouth daily.     FLUoxetine 20 MG tablet  Commonly known as:  PROZAC  Take 1.5 tablets (30 mg total) by mouth daily.     Hyoscyamine Sulfate 0.375 MG Tbcr  81 tab twice a day for 5 days, abdominal pain, then as needed           Objective:   Physical Exam BP 116/74 mmHg  Pulse 73  Temp(Src) 98.4 F (36.9 C) (Oral)  Ht  (1.702 m)  Wt 260 lb 6 oz (118.105 kg)  BMI 40.77 kg/m2  SpO2 97%  LMP 09/01/2014 (Exact Date) General:   Well developed, well nourished . NAD.  HEENT:  Normocephalic . Face symmetric, atraumatic. Neck: Enlarged, not nodular or tender thyroid gland, larger on the right. Lungs:  CTA B Normal respiratory effort, no intercostal retractions, no accessory muscle use. Heart: RRR,  no murmur.  No pretibial edema bilaterally  MSK: Knees symmetric, no effusion, redness or warmness. Range of motion normal Skin: Not pale. Not jaundice Neurologic:  alert & oriented X3.  Speech normal, gait appropriate for age and unassisted Psych--  Cognition and judgment appear intact.  Cooperative with normal attention span and concentration.  Behavior appropriate. No anxious or depressed appearing.      Assessment & Plan:   A/P Anxiety: Took Lexapro 10 mg without much help, make her slightly sleepy. Plan: Change to Prozac, target dose 30 mg. See instructions. Has not been able to see a counselor, she will try again at college. Knee pain: Bilateral knee pain, exam is unremarkable, pain is mostly anterior, I believe her obesity is playing a major role. Recommend weight loss, knee sleeves and  use of Motrin judiciously. Hives: Was referred to the allergist but has not seen them yet. She plans to call them. Recommend antihistaminic daily until then GERD, dyspepsia: Saw GI, they recommend ultrasound and  labs. Ultrasound was not done, will reschedule. Also recommend to die Hyoscyamine. Goiter: Was recommend that thyroid biopsy but she has not proceed, will reschedule  Today, I spent more than 25   min with the patient: >50% of the time counseling regards knee pain, use of knee sleeves, also discussing how to take fluoxetine. Also coordinating his care . Multiple questions answered to the best of my ability

## 2014-09-23 ENCOUNTER — Telehealth: Payer: Self-pay

## 2014-09-23 DIAGNOSIS — R1084 Generalized abdominal pain: Secondary | ICD-10-CM

## 2014-09-23 DIAGNOSIS — E049 Nontoxic goiter, unspecified: Secondary | ICD-10-CM

## 2014-09-23 NOTE — Telephone Encounter (Signed)
Orders entered

## 2014-09-23 NOTE — Telephone Encounter (Signed)
-----   Message from Wanda Plump, MD sent at 09/23/2014 12:46 PM EDT ----- Regarding: RE: Reschedule Stephanie Davies, please re-enter the orders for an abdominal ultrasound and thyroid biopsy JP   ----- Message -----    From: Oneal Grout    Sent: 09/23/2014   7:37 AM      To: Wanda Plump, MD Subject: RE: Reschedule                                 Yes we will need new orders, Dr Arlyce Dice order the Abd [place]. FYI [facility] has tired contacted the patient 3 times, with no return call. I will get it scheduled once new orders are placed. Thanks ----- Message -----    From: Wanda Plump, MD    Sent: 09/20/2014   5:47 PM      To: Wanda Plump, MD, Oneal Grout Subject: Reschedule                                     Please reschedule the thyroid biopsy and a abdominal ultrasound. Labs are alrerady in the computer, do we  need to enter them again?

## 2014-10-11 ENCOUNTER — Ambulatory Visit (INDEPENDENT_AMBULATORY_CARE_PROVIDER_SITE_OTHER): Payer: Medicaid Other | Admitting: Internal Medicine

## 2014-10-11 ENCOUNTER — Ambulatory Visit (HOSPITAL_BASED_OUTPATIENT_CLINIC_OR_DEPARTMENT_OTHER): Payer: Medicaid Other

## 2014-10-11 ENCOUNTER — Encounter: Payer: Self-pay | Admitting: Internal Medicine

## 2014-10-11 VITALS — BP 126/74 | HR 64 | Temp 98.1°F | Ht 67.0 in | Wt 254.5 lb

## 2014-10-11 DIAGNOSIS — F418 Other specified anxiety disorders: Secondary | ICD-10-CM | POA: Diagnosis not present

## 2014-10-11 DIAGNOSIS — F32A Depression, unspecified: Secondary | ICD-10-CM

## 2014-10-11 DIAGNOSIS — Z09 Encounter for follow-up examination after completed treatment for conditions other than malignant neoplasm: Secondary | ICD-10-CM

## 2014-10-11 DIAGNOSIS — F419 Anxiety disorder, unspecified: Principal | ICD-10-CM

## 2014-10-11 DIAGNOSIS — F329 Major depressive disorder, single episode, unspecified: Secondary | ICD-10-CM

## 2014-10-11 MED ORDER — ALPRAZOLAM 0.5 MG PO TABS: 0.5000 mg | ORAL_TABLET | Freq: Four times a day (QID) | ORAL | Status: DC | PRN

## 2014-10-11 MED ORDER — CITALOPRAM HYDROBROMIDE 20 MG PO TABS: 40.0000 mg | ORAL_TABLET | Freq: Every day | ORAL | Status: DC

## 2014-10-11 NOTE — Assessment & Plan Note (Signed)
Anxiety: long  history of problems but mention them to me for the first time ~2- 2016. Lexapro didn't help much , was switched to Prozac about 3 weeks ago and the anxiety and panic attacks got worse. He has a therapist and plans to continue treatments. Prozac was a started only 3 weeks ago, we would continue with it but the patient likes to change. Since Lexapro worked better, I will try citalopram (is slightly more sedating) and add Xanax as needed. Risk of drowsiness and abuse w/ xanax  discuss. Knows not  to take if she ever gets pregnant or breast-feeding. See instructions, follow-up 4 weeks, continue therapy

## 2014-10-11 NOTE — Patient Instructions (Addendum)
Stop fluoxetine  Start citalopram 20 mg: 1.5 tablets a day for one week, then 2 tablets daily   Take alprazolam up to every 6 hours as needed for panic attacks. Will cause drowsiness, be careful.  Please schedule a visit one month from today  Continue see the  therapist

## 2014-10-11 NOTE — Progress Notes (Signed)
Pre visit review using our clinic review tool, if applicable. No additional management support is needed unless otherwise documented below in the visit note. 

## 2014-10-11 NOTE — Progress Notes (Signed)
Subjective:    Patient ID: Stephanie Davies, female    DOB: 10/12/1975, 39 y.o.   MRN: [id number]  DOS:  10/11/2014 Type of visit - description : Follow-up Interval history: Was seen a few weeks ago with anxiety, switch from Lexapro to Prozac, no better, actually slightly worse. Having panic attacks frequently, sometimes 2 or 3 times a day, described as intense anxiety, hyperventilation, feeling tense, heart racing. He already saw a counselor and plans to continue with that.   Review of Systems Mild depression, no suicidal ideas  Past Medical History  Diagnosis Date  . Alopecia   . Pseudotumor cerebri   . Headache(784.0)   . Hemorrhoids   . Kidney stone   . GERD (gastroesophageal reflux disease) 02/14/2014  . Anxiety and depression 03/07/2014  . Goiter 03/07/2014    Past Surgical History  Procedure Laterality Date  . Cholecystectomy  2004    Social History   Social History  . Marital Status: Married    Spouse Name: N/A  . Number of Children: 1  . Years of Education: N/A   Occupational History  . stay home    Social History Main Topics  . Smoking status: Never Smoker   . Smokeless tobacco: Never Used  . Alcohol Use: No  . Drug Use: No  . Sexual Activity: Not Currently    Birth Control/ Protection: None   Other Topics Concern  . 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  Always use your most recent med list.               albuterol 108 (90 BASE) MCG/ACT inhaler  Commonly known as:  PROVENTIL HFA;VENTOLIN HFA  Inhale 2 puffs into the lungs every 6 (six) hours as needed for wheezing or shortness of breath.     cetirizine 10 MG tablet  Commonly known as:  ZYRTEC  Take 1 tablet (10 mg total) by mouth daily.     FLUoxetine 20 MG tablet  Commonly known as:  PROZAC  Take 1.5 tablets  (30 mg total) by mouth daily.     Hyoscyamine Sulfate 0.375 MG Tbcr  81 tab twice a day for 5 days, abdominal pain, then as needed           Objective:   Physical Exam BP 126/74 mmHg  Pulse 64  Temp(Src) 98.1 F (36.7 C) (Oral)  Ht  (1.702 m)  Wt 254 lb 8 oz (115.44 kg)  BMI 39.85 kg/m2  SpO2 96%  LMP 08/28/2014 (Exact Date)  Breastfeeding? No General:   Well developed, well nourished   HEENT:  Normocephalic . Face symmetric, atraumatic Neurologic:  alert & oriented X3.  Speech normal, gait appropriate for age and unassisted Psych--  Cognition and judgment appear intact.  Cooperative with normal attention span and concentration.  Behavior appropriate. Slightly anxious but not depressed appearing.      Assessment & Plan:   Assessment> Anxiety, depression Several tumor cerebri, headaches Goiter  Plan Anxiety: long  history of problems but mention them to me for the first time ~2- 2016. Lexapro didn't help much , was switched to Prozac about 3 weeks ago and the anxiety and panic attacks got worse. He has a therapist and plans to  continue treatments. Prozac was a started only 3 weeks ago, we would continue with it but the patient likes to change. Since Lexapro worked better, I will try citalopram (is slightly more sedating) and add Xanax as needed. Risk of drowsiness and abuse w/ xanax  discuss. Knows not  to take if she ever gets pregnant or breast-feeding. See instructions, follow-up 4 weeks, continue therapy

## 2014-10-21 ENCOUNTER — Ambulatory Visit (HOSPITAL_BASED_OUTPATIENT_CLINIC_OR_DEPARTMENT_OTHER): Payer: Medicaid Other

## 2014-10-22 ENCOUNTER — Other Ambulatory Visit: Payer: Self-pay | Admitting: Family Medicine

## 2014-11-04 ENCOUNTER — Ambulatory Visit (HOSPITAL_BASED_OUTPATIENT_CLINIC_OR_DEPARTMENT_OTHER): Payer: Self-pay

## 2014-11-11 ENCOUNTER — Ambulatory Visit (INDEPENDENT_AMBULATORY_CARE_PROVIDER_SITE_OTHER): Payer: Medicaid Other | Admitting: Internal Medicine

## 2014-11-11 ENCOUNTER — Encounter: Payer: Self-pay | Admitting: Internal Medicine

## 2014-11-11 VITALS — BP 116/74 | HR 80 | Temp 97.7°F | Ht 67.0 in | Wt 251.5 lb

## 2014-11-11 DIAGNOSIS — F418 Other specified anxiety disorders: Secondary | ICD-10-CM | POA: Diagnosis not present

## 2014-11-11 DIAGNOSIS — F32A Depression, unspecified: Secondary | ICD-10-CM

## 2014-11-11 DIAGNOSIS — Z09 Encounter for follow-up examination after completed treatment for conditions other than malignant neoplasm: Secondary | ICD-10-CM

## 2014-11-11 DIAGNOSIS — F419 Anxiety disorder, unspecified: Principal | ICD-10-CM

## 2014-11-11 DIAGNOSIS — F329 Major depressive disorder, single episode, unspecified: Secondary | ICD-10-CM

## 2014-11-11 MED ORDER — ALPRAZOLAM 0.5 MG PO TABS: 0.5000 mg | ORAL_TABLET | Freq: Four times a day (QID) | ORAL | Status: DC | PRN

## 2014-11-11 NOTE — Patient Instructions (Signed)
Go to the lab for a UDS (urine test)   Next visit  for a check up in 3 months      (15 minutes) Please schedule an appointment at the front desk No need to come back fasting

## 2014-11-11 NOTE — Progress Notes (Signed)
Pre visit review using our clinic review tool, if applicable. No additional management support is needed unless otherwise documented below in the visit note. 

## 2014-11-11 NOTE — Assessment & Plan Note (Signed)
Anxiety depression: Improving with citalopram and Xanax as needed. UDS and contract today. Refill Xanax, counseled, recommend to continue seeing her therapist, again reminded that she cannot take Xanax if pregnant or breast feeding.  RTC  3 months

## 2014-11-11 NOTE — Progress Notes (Signed)
Subjective:    Patient ID: Stephanie Davies, female    DOB: [birth year], 39 y.o.   MRN: [id number]  DOS:  11/11/2014 Type of visit - description : Follow-up Interval history: Good compliance with medications, she recently noted improvement, panic attacks has definitely decreased from daily to 2 episodes per week for which she takes Xanax as needed. Still sees therapies.   Review of Systems Denies nausea vomiting She recognizes she had mild depression that that has improved as well. No suicidal ideas.   doing well in school.  Past Medical History  Diagnosis Date  . Alopecia   . Pseudotumor cerebri   . Headache(784.0)   . Hemorrhoids   . Kidney stone   . GERD (gastroesophageal reflux disease) 02/14/2014  . Anxiety and depression 03/07/2014  . Goiter 03/07/2014    Past Surgical History  Procedure Laterality Date  . Cholecystectomy  2004    Social History   Social History  . Marital Status: Married    Spouse Name: N/A  . Number of Children: 1  . Years of Education: N/A   Occupational History  . stay home    Social History Main Topics  . Smoking status: Never Smoker   . Smokeless tobacco: Never Used  . Alcohol Use: No  . Drug Use: No  . Sexual Activity: Not Currently    Birth Control/ Protection: None   Other Topics Concern  . 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  Always use your most recent med list.               albuterol 108 (90 BASE) MCG/ACT inhaler  Commonly known as:  PROVENTIL HFA;VENTOLIN HFA  Inhale 2 puffs into the lungs every 6 (six) hours as needed for wheezing or shortness of breath.     ALPRAZolam 0.5 MG tablet  Commonly known as:  XANAX  Take 1 tablet (0.5 mg total) by mouth 4 (four) times daily as needed for anxiety.     cetirizine 10 MG tablet  Commonly  known as:  ZYRTEC  Take 1 tablet (10 mg total) by mouth daily.     citalopram 20 MG tablet  Commonly known as:  CELEXA  Take 2 tablets (40 mg total) by mouth daily.     Hyoscyamine Sulfate 0.375 MG Tbcr  81 tab twice a day for 5 days, abdominal pain, then as needed           Objective:   Physical Exam BP 116/74 mmHg  Pulse 80  Temp(Src) 97.7 F (36.5 C) (Oral)  Ht 5\' 7"  (1.702 m)  Wt 251 lb 8 oz (114.08 kg)  BMI 39.38 kg/m2  SpO2 96%  LMP 10/25/2014 (Exact Date)  Breastfeeding? No General:   Well developed, well nourished . NAD.  HEENT:  Normocephalic . Face symmetric, atraumatic Neurologic:  alert & oriented X3.  Speech normal, gait appropriate for age and unassisted Psych--  Cognition and judgment appear intact.  Cooperative with normal attention span and concentration.  Behavior appropriate. No anxious or depressed appearing.      Assessment & Plan:   Assessment> Anxiety, depression Several tumor cerebri, headaches Goiter   Plan: Anxiety depression: Improving with citalopram and Xanax as needed. UDS and contract  today. Refill Xanax, counseled, recommend to continue seeing her therapist, again reminded that she cannot take Xanax if pregnant or breast feeding.  RTC  3 months

## 2015-02-11 ENCOUNTER — Ambulatory Visit: Payer: Self-pay | Admitting: Internal Medicine

## 2015-02-14 ENCOUNTER — Telehealth: Payer: Self-pay

## 2015-02-14 NOTE — Telephone Encounter (Signed)
UDS: 11/11/2014  Negative for Alprazolam: PRN   Low risk per Dr. Drue Novel 02/14/2015

## 2015-02-25 ENCOUNTER — Ambulatory Visit (INDEPENDENT_AMBULATORY_CARE_PROVIDER_SITE_OTHER): Payer: Medicaid Other | Admitting: Internal Medicine

## 2015-02-25 ENCOUNTER — Encounter: Payer: Self-pay | Admitting: Internal Medicine

## 2015-02-25 VITALS — BP 98/64 | HR 72 | Temp 98.0°F | Resp 16 | Ht 67.0 in | Wt 260.2 lb

## 2015-02-25 DIAGNOSIS — K219 Gastro-esophageal reflux disease without esophagitis: Secondary | ICD-10-CM | POA: Diagnosis not present

## 2015-02-25 DIAGNOSIS — F329 Major depressive disorder, single episode, unspecified: Secondary | ICD-10-CM

## 2015-02-25 DIAGNOSIS — Z09 Encounter for follow-up examination after completed treatment for conditions other than malignant neoplasm: Secondary | ICD-10-CM

## 2015-02-25 DIAGNOSIS — F418 Other specified anxiety disorders: Secondary | ICD-10-CM | POA: Diagnosis not present

## 2015-02-25 DIAGNOSIS — F419 Anxiety disorder, unspecified: Secondary | ICD-10-CM

## 2015-02-25 DIAGNOSIS — R21 Rash and other nonspecific skin eruption: Secondary | ICD-10-CM

## 2015-02-25 DIAGNOSIS — F32A Depression, unspecified: Secondary | ICD-10-CM

## 2015-02-25 MED ORDER — KETOCONAZOLE 2 % EX CREA
1.0000 | TOPICAL_CREAM | Freq: Every day | CUTANEOUS | Status: DC
Start: 2015-02-25 — End: 2015-04-11

## 2015-02-25 MED ORDER — PANTOPRAZOLE SODIUM 40 MG PO TBEC: 40.0000 mg | DELAYED_RELEASE_TABLET | Freq: Every day | ORAL | Status: DC

## 2015-02-25 MED ORDER — ALPRAZOLAM 0.5 MG PO TABS: 0.5000 mg | ORAL_TABLET | Freq: Four times a day (QID) | ORAL | Status: DC | PRN

## 2015-02-25 MED ORDER — CITALOPRAM HYDROBROMIDE 20 MG PO TABS: 40.0000 mg | ORAL_TABLET | Freq: Every day | ORAL | Status: DC

## 2015-02-25 NOTE — Progress Notes (Signed)
Subjective:    Patient ID: Stephanie Davies, female    DOB: March 26, 1975, 40 y.o.   MRN: [id number]  DOS:  02/25/2015 Type of visit - description : Routine office visit Interval history: Anxiety: Good compliance of medication, good control of anxiety/panic attacks, she still has recurrent/ intrusive thoughts about her family situation.  Over the last few weeks, has developed a hot sensation in the epigastrium, no radiation, slightly worse after eating. Denies nausea, vomiting, diarrhea. No blood in the stools. Occasionally has classic heartburn but  not frequently or constant.  Also, has a rash on the neck and abdomen for a week and between the toes for a while.   Review of Systems   Past Medical History  Diagnosis Date  . Alopecia   . Pseudotumor cerebri   . Headache(784.0)   . Hemorrhoids   . Kidney stone   . GERD (gastroesophageal reflux disease) 02/14/2014  . Anxiety and depression 03/07/2014  . Goiter 03/07/2014    Past Surgical History  Procedure Laterality Date  . Cholecystectomy  2004    Social History   Social History  . Marital Status: Married    Spouse Name: N/A  . Number of Children: 1  . Years of Education: N/A   Occupational History  . stay home    Social History Main Topics  . Smoking status: Never Smoker   . Smokeless tobacco: Never Used  . Alcohol Use: No  . Drug Use: No  . Sexual Activity: Not Currently    Birth Control/ Protection: None   Other Topics Concern  . 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  Always use your most recent med list.               albuterol 108 (90 Base) MCG/ACT inhaler  Commonly known as:  PROVENTIL HFA;VENTOLIN HFA  Inhale 2 puffs into the lungs every 6 (six) hours as needed for wheezing or shortness of breath.     ALPRAZolam 0.5 MG  tablet  Commonly known as:  XANAX  Take 1 tablet (0.5 mg total) by mouth 4 (four) times daily as needed for anxiety.     cetirizine 10 MG tablet  Commonly known as:  ZYRTEC  Take 1 tablet (10 mg total) by mouth daily.     citalopram 20 MG tablet  Commonly known as:  CELEXA  Take 2 tablets (40 mg total) by mouth daily.     Hyoscyamine Sulfate 0.375 MG Tbcr  Take 1 tablet by mouth daily as needed.     ketoconazole 2 % cream  Commonly known as:  NIZORAL  Apply 1 application topically daily.     pantoprazole 40 MG tablet  Commonly known as:  PROTONIX  Take 1 tablet (40 mg total) by mouth daily.           Objective:   Physical Exam BP 98/64 mmHg  Pulse 72  Temp(Src) 98 F (36.7 C) (Oral)  Resp 16  Ht  (1.702 m)  Wt 260 lb 4 oz (118.049 kg)  BMI 40.75 kg/m2  SpO2 98%  LMP 02/25/2015  Breastfeeding? Unknown General:   Well developed, well nourished . NAD.  Abdomen:  Not distended, soft, mild epigastric tenderness, no mass,  rebound or rigidity. Skin:  Maceration between the toes, on the right foot, no cellulitis type changes. Mild hyperpigmentation at the lower abdominal folds. No maceration. Has a single, 12 cm hyperpigmented, not scaly lesions on the right side of the neck. Neurologic:  alert & oriented X3.  Speech normal, gait appropriate for age and unassisted Strength symmetric and appropriate for age.  Psych: Cognition and judgment appear intact.  Cooperative with normal attention span and concentration.  Behavior appropriate. No anxious or depressed appearing.    Assessment & Plan:   Assessment> Anxiety, depression Several tumor cerebri, headaches Goiter   Plan: Anxiety- depression: Overall sx well controlled except for intrusive thoughts about the many challenges she faces. We talk about possibly changing to another medication (cymbalta/clonazepam?) But at this point we agreed that symptoms are relatively well controlled so we decided not to  make any changes. I did recommend her to continue working with her counselor. GERD-dyspepsia: Sx resurfaced, she is tender at the epigastric area, no red flag symptoms such as blood in the stools. Recommend to restart Protonix, call if not improving. Rash: Fungal infection between the toes, recommend Nizoral. Mild hyperpigmentation on the abdomen. We'll try Nizoral as well. Also, she has Medicaid [place] access, unfortunately I'm not a provider for that insurance. Recommend to discuss with them her options . RTC 4-5 weeks, here or with [place] acces MD

## 2015-02-25 NOTE — Patient Instructions (Signed)
Continue taking the same medications and in addition:  Take Protonix 1 tablet every morning before your breakfast to help your stomach pain  Use the cream between your toes twice a day until better  You may use the same cream under your stomach.  Please come back in 4-5 weeks for a checkup  Call or go to the ER if you have severe stomach symptoms,  nausea or vomiting.  Please check your insurance [place] access

## 2015-02-25 NOTE — Assessment & Plan Note (Signed)
Anxiety- depression: Overall sx well controlled except for intrusive thoughts about the many challenges she faces. We talk about possibly changing to another medication (cymbalta/clonazepam?) But at this point we agreed that symptoms are relatively well controlled so we decided not to make any changes. I did recommend her to continue working with her counselor. GERD-dyspepsia: Sx resurfaced, she is tender at the epigastric area, no red flag symptoms such as blood in the stools. Recommend to restart Protonix, call if not improving. Rash: Fungal infection between the toes, recommend Nizoral. Mild hyperpigmentation on the abdomen. We'll try Nizoral as well. Also, she has Medicaid [place] access, unfortunately I'm not a provider for that insurance. Recommend to discuss with them her options . RTC 4-5 weeks, here or with [place] acces MD

## 2015-02-25 NOTE — Progress Notes (Signed)
Pre visit review using our clinic review tool, if applicable. No additional management support is needed unless otherwise documented below in the visit note. 

## 2015-02-28 ENCOUNTER — Other Ambulatory Visit: Payer: Self-pay | Admitting: Gastroenterology

## 2015-03-14 ENCOUNTER — Other Ambulatory Visit: Payer: Self-pay | Admitting: Medical

## 2015-03-17 ENCOUNTER — Encounter: Payer: Self-pay | Admitting: Internal Medicine

## 2015-04-11 ENCOUNTER — Ambulatory Visit (INDEPENDENT_AMBULATORY_CARE_PROVIDER_SITE_OTHER): Payer: Medicaid Other | Admitting: Family

## 2015-04-11 ENCOUNTER — Encounter: Payer: Self-pay | Admitting: Family

## 2015-04-11 ENCOUNTER — Ambulatory Visit (INDEPENDENT_AMBULATORY_CARE_PROVIDER_SITE_OTHER)
Admission: RE | Admit: 2015-04-11 | Discharge: 2015-04-11 | Disposition: A | Discharge status: Home / Self Care | Payer: Medicaid Other | Source: Ambulatory Visit | Attending: Family | Admitting: Family

## 2015-04-11 VITALS — BP 112/62 | HR 94 | Temp 98.2°F | Resp 18 | Ht 67.0 in | Wt 255.0 lb

## 2015-04-11 DIAGNOSIS — R05 Cough: Secondary | ICD-10-CM

## 2015-04-11 DIAGNOSIS — R053 Chronic cough: Secondary | ICD-10-CM | POA: Insufficient documentation

## 2015-04-11 DIAGNOSIS — R059 Cough, unspecified: Secondary | ICD-10-CM

## 2015-04-11 MED ORDER — FLUTICASONE FUROATE-VILANTEROL 100-25 MCG/INH IN AEPB: 1.0000 | INHALATION_SPRAY | Freq: Every day | RESPIRATORY_TRACT | Status: DC

## 2015-04-11 MED ORDER — HYDROCODONE-HOMATROPINE 5-1.5 MG/5ML PO SYRP: 5.0000 mL | ORAL_SOLUTION | Freq: Three times a day (TID) | ORAL | Status: DC | PRN

## 2015-04-11 MED ORDER — LEVOFLOXACIN 500 MG PO TABS: 500.0000 mg | ORAL_TABLET | Freq: Every day | ORAL | Status: DC

## 2015-04-11 NOTE — Patient Instructions (Signed)
Thank you for choosing [facility].  Summary/Instructions:  Your prescription(s) have been submitted to your pharmacy or been printed and provided for you. Please take as directed and contact our office if you believe you are having problem(s) with the medication(s) or have any questions.  If your symptoms worsen or fail to improve, please contact our office for further instruction, or in case of emergency go directly to the emergency room at the closest medical facility.   General Recommendations:    Please drink plenty of fluids.  Get plenty of rest   Sleep in humidified air  Use saline nasal sprays  Netti pot   OTC Medications:  Decongestants - helps relieve congestion   Flonase (generic fluticasone) or Nasacort (generic triamcinolone) - please make sure to use the "cross-over" technique at a 45 degree angle towards the opposite eye as opposed to straight up the nasal passageway.   Sudafed (generic pseudoephedrine - Note this is the one that is available behind the pharmacy counter); Products with phenylephrine (-PE) may also be used but is often not as effective as pseudoephedrine.   If you have HIGH BLOOD PRESSURE - Coricidin HBP; AVOID any product that is -D as this contains pseudoephedrine which may increase your blood pressure.  Afrin (oxymetazoline) every 6-8 hours for up to 3 days.   Allergies - helps relieve runny nose, itchy eyes and sneezing   Claritin (generic loratidine), Allegra (fexofenidine), or Zyrtec (generic cyrterizine) for runny nose. These medications should not cause drowsiness.  Note - Benadryl (generic diphenhydramine) may be used however may cause drowsiness  Cough -   Delsym or Robitussin (generic dextromethorphan)  Expectorants - helps loosen mucus to ease removal   Mucinex (generic guaifenesin) as directed on the package.  Headaches / General Aches   Tylenol (generic acetaminophen) - DO NOT EXCEED 3 grams (3,000 mg) in a 24  hour time period  Advil/Motrin (generic ibuprofen)   Sore Throat -   Salt water gargle   Chloraseptic (generic benzocaine) spray or lozenges / Sucrets (generic dyclonine)      

## 2015-04-11 NOTE — Assessment & Plan Note (Addendum)
Symptoms and exam consistent with potential bronchitis with concern for pneumonia and possible influenza. Obtain chest x-ray. Start levofloxacin. Start hycodan as needed for cough and sleep. Sample of Breo provided. Continue over the counter medications as needed for symptom relief and supportive care. Follow up if symptoms worsen or fail to improve.

## 2015-04-11 NOTE — Progress Notes (Signed)
Subjective:    Patient ID: Stephanie Davies, female    DOB: Sep 13, 1975, 40 y.o.   MRN: [id number]  Chief Complaint  Patient presents with  . Cough    body aches, cough, runny nose, sinus pressure, congestion, vomitting and diarrhea since sunday    HPI:  Stephanie Davies is a 40 y.o. female who  has a past medical history of Alopecia; Pseudotumor cerebri; Headache(784.0); Hemorrhoids; Kidney stone; GERD (gastroesophageal reflux disease) (02/14/2014); Anxiety and depression (03/07/2014); and Goiter (03/07/2014). and presents today for an acute office visit.  This is a new problem. Associated symptoms of body aches, cough, runny nose, sinus pressure, congestion, vomiting, and diarrhea have been going on for approximately 5 days. Modifying factors ibuprofen, cough medicine, Mucinex all of which have not helped very much with her symptoms. The severity is enough to effect her sleep pattern. Indicates that she has had a fever with a temperature maximum of 100 with the last fever being yesterday. Frequency of diarreha is 5-6 times per day and vomiting 2-3 times per day. Course of her symptoms has progressively worsened since the initial onset. Denies recent antibiotic use.  No Known Allergies   Current Outpatient Prescriptions on File Prior to Visit  Medication Sig Dispense Refill  . ALPRAZolam (XANAX) 0.5 MG tablet Take 1 tablet (0.5 mg total) by mouth 4 (four) times daily as needed for anxiety. 40 tablet 1  . citalopram (CELEXA) 20 MG tablet Take 2 tablets (40 mg total) by mouth daily. 60 tablet 6   No current facility-administered medications on file prior to visit.     Past Surgical History  Procedure Laterality Date  . Cholecystectomy  2004     Review of Systems  Constitutional: Positive for fever and chills.  HENT: Positive for congestion, sinus pressure and sore throat.   Respiratory: Positive for cough, chest tightness and shortness of breath.   Gastrointestinal: Positive for  vomiting and diarrhea. Negative for blood in stool.  Neurological: Positive for headaches.      Objective:    BP 112/62 mmHg  Pulse 94  Temp(Src) 98.2 F (36.8 C) (Oral)  Resp 18  Ht 5\' 7"  (1.702 m)  Wt 255 lb (115.667 kg)  BMI 39.93 kg/m2  SpO2 98%  LMP 03/21/2015 (Exact Date) Nursing note and vital signs reviewed.  Physical Exam  Constitutional: She is oriented to person, place, and time. She appears well-developed and well-nourished. She appears ill. No distress.  HENT:  Right Ear: Hearing, tympanic membrane, external ear and ear canal normal.  Left Ear: Hearing, tympanic membrane, external ear and ear canal normal.  Nose: Nose normal. Right sinus exhibits no maxillary sinus tenderness and no frontal sinus tenderness. Left sinus exhibits no maxillary sinus tenderness and no frontal sinus tenderness.  Mouth/Throat: Uvula is midline, oropharynx is clear and moist and mucous membranes are normal.  Cardiovascular: Normal rate, regular rhythm, normal heart sounds and intact distal pulses.   Pulmonary/Chest: Effort normal and breath sounds normal.  Neurological: She is alert and oriented to person, place, and time.  Skin: Skin is warm and dry.  Psychiatric: She has a normal mood and affect. Her behavior is normal. Judgment and thought content normal.       Assessment & Plan:   Problem List Items Addressed This Visit      Other   Cough - Primary    Symptoms and exam consistent with potential bronchitis with concern for pneumonia and possible influenza. Obtain chest x-ray. Start levofloxacin.  Start hycodan as needed for cough and sleep. Sample of Breo provided. Continue over the counter medications as needed for symptom relief and supportive care. Follow up if symptoms worsen or fail to improve.       Relevant Medications   levofloxacin (LEVAQUIN) 500 MG tablet   HYDROcodone-homatropine (HYCODAN) 5-1.5 MG/5ML syrup   fluticasone furoate-vilanterol (BREO ELLIPTA) 100-25 MCG/INH  AEPB   Other Relevant Orders   DG Chest 2 View (Completed)

## 2015-04-11 NOTE — Progress Notes (Signed)
Pre visit review using our clinic review tool, if applicable. No additional management support is needed unless otherwise documented below in the visit note. 

## 2015-04-13 ENCOUNTER — Telehealth: Payer: Self-pay | Admitting: Family

## 2015-04-13 NOTE — Telephone Encounter (Signed)
Please inform patient there is no evidence of pneumonia and therefore this is most likely bronchitis and upper respiratory infection. If she continues to experience symptoms or if symptoms are not improving please let us know.

## 2015-04-14 ENCOUNTER — Telehealth: Payer: Self-pay | Admitting: Internal Medicine

## 2015-04-14 ENCOUNTER — Telehealth: Payer: Self-pay

## 2015-04-14 NOTE — Telephone Encounter (Signed)
Pt requesting results to her xray

## 2015-04-14 NOTE — Telephone Encounter (Signed)
A user error has taken place: ERROR °

## 2015-04-14 NOTE — Telephone Encounter (Signed)
Pt aware of results 

## 2015-04-15 ENCOUNTER — Telehealth: Payer: Self-pay

## 2015-04-15 NOTE — Telephone Encounter (Signed)
PA initiated via [employer] provider line [phone] Pt's ID # [id number] K PA ID [id number].   Instructed to follow up for determination in 24 hours

## 2015-04-16 NOTE — Telephone Encounter (Signed)
PA has been APPROVED through 10/11/2016. Pharmacy advised via pharmacy VM

## 2015-04-26 ENCOUNTER — Ambulatory Visit (INDEPENDENT_AMBULATORY_CARE_PROVIDER_SITE_OTHER): Payer: Medicaid Other | Admitting: Family Medicine

## 2015-04-26 ENCOUNTER — Encounter: Payer: Self-pay | Admitting: Family Medicine

## 2015-04-26 VITALS — BP 100/60 | HR 80 | Temp 97.9°F | Resp 20 | Ht 67.0 in | Wt 254.0 lb

## 2015-04-26 DIAGNOSIS — K859 Acute pancreatitis without necrosis or infection, unspecified: Secondary | ICD-10-CM | POA: Diagnosis not present

## 2015-04-26 DIAGNOSIS — L309 Dermatitis, unspecified: Secondary | ICD-10-CM

## 2015-04-26 MED ORDER — TRIAMCINOLONE ACETONIDE 0.1 % EX CREA: 1.0000 "application " | TOPICAL_CREAM | Freq: Two times a day (BID) | CUTANEOUS | Status: DC

## 2015-04-26 MED ORDER — PANTOPRAZOLE SODIUM 40 MG PO TBEC: 40.0000 mg | DELAYED_RELEASE_TABLET | Freq: Every day | ORAL | Status: DC

## 2015-04-26 NOTE — Progress Notes (Signed)
   Subjective:    Patient ID: Stephanie Davies, female    DOB: 04-Feb-1975, 40 y.o.   MRN: [id number]  HPI Here for 2 things. First she has had an itchy rash on the right side of the neck for the past month. Second she has had 4 days of intermittent epigastric pains with nausea and one episode of vomiting. No fever. Her appetite is slightly decreased. She says eating foods does not seem to affect the pain. She has been taking Pantoprazole daily with no apparent effect. She does have known GERD. She is S/P a cholecystectomy.    Review of Systems  Constitutional: Negative.   Respiratory: Negative.   Gastrointestinal: Positive for nausea and abdominal pain. Negative for diarrhea, constipation, blood in stool, abdominal distention and anal bleeding.  Skin: Positive for rash.       Objective:   Physical Exam  Constitutional: She appears well-developed and well-nourished. No distress.  Neck: No thyromegaly present.  Cardiovascular: Normal rate, regular rhythm, normal heart sounds and intact distal pulses.   Pulmonary/Chest: Effort normal and breath sounds normal.  Abdominal: Soft. Bowel sounds are normal. She exhibits no distension and no mass. There is no rebound and no guarding.  Moderately tender in the epigastrium and the LUQ.   Lymphadenopathy:    She has no cervical adenopathy.  Skin:  There is a patch of slightly raised papules and a macular erythema on the right neck area           Assessment & Plan:  She seems to be having a mild case of pancreatitis. She is taking a daily PPI which should be covering for any GERD. We will try to deal with this as an outpatient since she is not in any distress. She will follow a clear liquid diet and avoid any solid food as well as any dairy products over the weekend. Drink plenty of water. She is to see Dr. Drue NovelPaz on Monday for a follow up evaluation. She knows to go to the ER if the pain gets worse. Try Triamcinolone cream on the rash.

## 2015-05-08 ENCOUNTER — Encounter: Payer: Medicaid Other | Admitting: Medical

## 2015-05-08 ENCOUNTER — Telehealth: Payer: Self-pay | Admitting: Medical

## 2015-05-08 DIAGNOSIS — Z0289 Encounter for other administrative examinations: Secondary | ICD-10-CM

## 2015-05-08 NOTE — Progress Notes (Signed)
This encounter was created in error - please disregard.

## 2015-05-12 NOTE — Telephone Encounter (Signed)
Pt was no show 05/08/15 10:45am for acute appt, 1st no show + 4 cancellations w/in 12 months, pt has not rescheduled, charge or no charge?

## 2015-05-12 NOTE — Telephone Encounter (Signed)
charge 

## 2015-05-14 ENCOUNTER — Encounter: Payer: Self-pay | Admitting: Medical

## 2015-05-14 NOTE — Telephone Encounter (Signed)
Marked to charge and mailing no show letter °

## 2015-05-22 ENCOUNTER — Other Ambulatory Visit: Payer: Self-pay | Admitting: Family Medicine

## 2015-05-22 NOTE — Telephone Encounter (Signed)
Caller name: Relationship to patient: Can be reached: [phone] [facility] [place] - [address]  Reason for call: Pt is out of cream and has a painful hemorrhoid . Please send in new RX.

## 2015-05-23 ENCOUNTER — Telehealth: Payer: Self-pay | Admitting: Internal Medicine

## 2015-05-23 ENCOUNTER — Ambulatory Visit: Payer: Medicaid Other | Admitting: Physician Assistant

## 2015-05-23 NOTE — Telephone Encounter (Signed)
Pt lvm at 4:05am. Stating that she was currently in the ED not feeling well. Pt wanted to make PCP aware that she was going to miss her appt.    Should pt be charged?

## 2015-05-23 NOTE — Telephone Encounter (Signed)
Please advise 

## 2015-05-23 NOTE — Telephone Encounter (Signed)
Recommend OTC Nupercainal. Needs to be seen in bleeding or severe sx

## 2015-05-23 NOTE — Telephone Encounter (Signed)
No Charge 

## 2015-06-28 ENCOUNTER — Telehealth: Payer: Self-pay | Admitting: Internal Medicine

## 2015-06-28 DIAGNOSIS — E041 Nontoxic single thyroid nodule: Secondary | ICD-10-CM

## 2015-06-28 NOTE — Telephone Encounter (Signed)
On chart review, she was recommended but never proceed w/ a thyroid bx, please call pt, rec to schedule the bx

## 2015-06-30 NOTE — Telephone Encounter (Signed)
Referral placed.

## 2015-06-30 NOTE — Telephone Encounter (Signed)
[place] [employer] [place] [employer]. Pt would have to pay completely out of pocket for this test since we do not bill to [place] [employer].

## 2015-06-30 NOTE — Telephone Encounter (Signed)
Arrange a endocrinology referral, DX thyroid nodule.

## 2015-06-30 NOTE — Addendum Note (Signed)
Addended byConrad Hickman: Patrich Heinze D on: 06/30/2015 10:03 AM   Modules accepted: Orders

## 2015-09-03 ENCOUNTER — Encounter: Payer: Self-pay | Admitting: Internal Medicine

## 2015-09-03 ENCOUNTER — Ambulatory Visit (INDEPENDENT_AMBULATORY_CARE_PROVIDER_SITE_OTHER): Payer: Medicaid Other | Admitting: Internal Medicine

## 2015-09-03 VITALS — BP 98/56 | HR 71 | Temp 97.8°F | Resp 16 | Ht 66.0 in | Wt 262.4 lb

## 2015-09-03 DIAGNOSIS — R05 Cough: Secondary | ICD-10-CM

## 2015-09-03 DIAGNOSIS — K648 Other hemorrhoids: Secondary | ICD-10-CM

## 2015-09-03 DIAGNOSIS — R109 Unspecified abdominal pain: Secondary | ICD-10-CM | POA: Diagnosis not present

## 2015-09-03 DIAGNOSIS — F32A Depression, unspecified: Secondary | ICD-10-CM

## 2015-09-03 DIAGNOSIS — F418 Other specified anxiety disorders: Secondary | ICD-10-CM | POA: Diagnosis not present

## 2015-09-03 DIAGNOSIS — K610 Anal abscess: Secondary | ICD-10-CM | POA: Diagnosis not present

## 2015-09-03 DIAGNOSIS — R059 Cough, unspecified: Secondary | ICD-10-CM

## 2015-09-03 DIAGNOSIS — F329 Major depressive disorder, single episode, unspecified: Secondary | ICD-10-CM

## 2015-09-03 DIAGNOSIS — F419 Anxiety disorder, unspecified: Secondary | ICD-10-CM

## 2015-09-03 LAB — POCT URINE PREGNANCY: Preg Test, Ur: NEGATIVE

## 2015-09-03 MED ORDER — CITALOPRAM HYDROBROMIDE 20 MG PO TABS: 40.0000 mg | ORAL_TABLET | Freq: Every day | ORAL | 5 refills | Status: DC

## 2015-09-03 MED ORDER — FLUTICASONE FUROATE-VILANTEROL 100-25 MCG/INH IN AEPB: 1.0000 | INHALATION_SPRAY | Freq: Every day | RESPIRATORY_TRACT | 5 refills | Status: DC

## 2015-09-03 MED ORDER — ALPRAZOLAM 0.5 MG PO TABS: 0.5000 mg | ORAL_TABLET | Freq: Four times a day (QID) | ORAL | 0 refills | Status: DC | PRN

## 2015-09-03 NOTE — Patient Instructions (Addendum)
Please come back in 6 months for a routine checkup  Okay to restart citalopram 20 mg: The first 2 weeks take only half tablet daily, then 1 tablet a day x 2 weeks , the 2 tablets a day Okay to restart Xanax as needed   We are referring you to surgery. If you feel  worse, have fever, chills, increased swelling: Please call the office  Sitz bath okay

## 2015-09-03 NOTE — Progress Notes (Signed)
Pre visit review using our clinic review tool, if applicable. No additional management support is needed unless otherwise documented below in the visit note. 

## 2015-09-03 NOTE — Progress Notes (Signed)
Subjective:    Patient ID: Stephanie Davies, female    DOB: 12/11/1975, 40 y.o.   MRN: [id number]  DOS:  09/03/2015 Type of visit - description : acute Interval history: 4 weeks h/o peri anal pain, swelling;  went to see an M.D. [place] ago  at the time was told had a perianal  Abscess but was not ready for a I&D, was Rx abx po The area continue to be swollen, occasional pain mostly when she walks, it has not drained spontaneously according to the patient.  Also, for several months while she was [place] did not take Xanax or citalopram because she was trying to get pregnant. States she is ready to go back.   Review of Systems  Denies fever chills No nausea, vomiting, diarrhea. + abdominal pain --->  lower abdominal cramps related to her period which started today.  Past Medical History:  Diagnosis Date  . Alopecia   . Anxiety and depression 03/07/2014  . GERD (gastroesophageal reflux disease) 02/14/2014  . Goiter 03/07/2014  . Headache(784.0)   . Hemorrhoids   . Kidney stone   . Pseudotumor cerebri     Past Surgical History:  Procedure Laterality Date  . CHOLECYSTECTOMY  2004    Social History   Social History  . Marital status: Married    Spouse name: N/A  . Number of children: 1  . Years of education: N/A   Occupational History  . stay home    Social History Main Topics  . Smoking status: Never Smoker  . Smokeless tobacco: Never Used  . Alcohol use No  . Drug use: No  . Sexual activity: Not Currently    Birth control/ protection: None   Other Topics Concern  . 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 Always use your most recent med list.          ALPRAZolam 0.5 MG tablet Commonly known as:  XANAX Take 1 tablet (0.5 mg total) by mouth 4 (four) times daily as  needed for anxiety.   citalopram 20 MG tablet Commonly known as:  CELEXA Take 2 tablets (40 mg total) by mouth daily.   fluticasone furoate-vilanterol 100-25 MCG/INH Aepb Commonly known as:  BREO ELLIPTA Inhale 1 puff into the lungs daily.   pantoprazole 40 MG tablet Commonly known as:  PROTONIX Take 1 tablet (40 mg total) by mouth daily.   triamcinolone cream 0.1 % Commonly known as:  KENALOG Apply 1 application topically 2 (two) times daily.          Objective:   Physical Exam  Genitourinary:      BP (!) 98/56 (BP Location: Left Arm, Patient Position: Sitting, Cuff Size: Large)   Pulse 71   Temp 97.8 F (36.6 C) (Oral)   Resp 16   Ht 5\' 6"  (1.676 m)   Wt 262 lb 6.4 oz (119 kg)   LMP 09/03/2015 (Exact Date) Comment: menstrual cycle started today  SpO2 98%   BMI 42.35 kg/m  General:   Well developed, well nourished . NAD.  HEENT:  Normocephalic . Face symmetric, atraumatic Abdomen:  Not distended, soft, slightly tender at the lower abdomen without mass or rebound, patient is on her period. Skin: Not  pale. Not jaundice Neurologic:  alert & oriented X3.  Speech normal, gait appropriate for age and unassisted Psych--  Cognition and judgment appear intact.  Cooperative with normal attention span and concentration.  Behavior appropriate. No anxious or depressed appearing.    Assessment & Plan:   Assessment Anxiety, depression Several tumor cerebri, headaches Goiter   PLAN: Anal abscesses? No clear to me if she has a resolving abscess versus other pathology . Will refer to surgery. Anxiety depression: She was trying to get pregnant while she was visiting her husband and stopped meds but she is now back in the [place]band in [place] UPT today is negative, restart medications gradually, see AVS.  RTC 6 months

## 2015-09-03 NOTE — Assessment & Plan Note (Signed)
Anal abscesses? No clear to me if she has a resolving abscess versus other pathology . Will refer to surgery. Anxiety depression: She was trying to get pregnant while she was visiting her husband and stopped meds but [place] is now back [place] the [place] UPT today is negative, restart medications gradually, see AVS.  RTC 6 months

## 2015-09-12 ENCOUNTER — Encounter: Payer: Self-pay | Admitting: Internal Medicine

## 2015-09-12 ENCOUNTER — Ambulatory Visit (INDEPENDENT_AMBULATORY_CARE_PROVIDER_SITE_OTHER): Payer: Medicaid Other | Admitting: Internal Medicine

## 2015-09-12 VITALS — BP 112/74 | HR 72 | Temp 98.0°F | Resp 14 | Ht 66.0 in | Wt 262.0 lb

## 2015-09-12 DIAGNOSIS — R21 Rash and other nonspecific skin eruption: Secondary | ICD-10-CM

## 2015-09-12 DIAGNOSIS — E049 Nontoxic goiter, unspecified: Secondary | ICD-10-CM | POA: Diagnosis not present

## 2015-09-12 DIAGNOSIS — E010 Iodine-deficiency related diffuse (endemic) goiter: Secondary | ICD-10-CM

## 2015-09-12 LAB — TSH: TSH: 0.85 u[IU]/mL (ref 0.35–4.50)

## 2015-09-12 LAB — T3, FREE: T3, Free: 2.6 pg/mL (ref 2.3–4.2)

## 2015-09-12 LAB — T4, FREE: Free T4: 0.76 ng/dL (ref 0.60–1.60)

## 2015-09-12 MED ORDER — BETAMETHASONE DIPROPIONATE AUG 0.05 % EX CREA: TOPICAL_CREAM | Freq: Two times a day (BID) | CUTANEOUS | 0 refills | Status: DC

## 2015-09-12 NOTE — Patient Instructions (Signed)
Apply the ointment twice a day for 10 days

## 2015-09-12 NOTE — Progress Notes (Signed)
Pre visit review using our clinic review tool, if applicable. No additional management support is needed unless otherwise documented below in the visit note. 

## 2015-09-12 NOTE — Progress Notes (Signed)
Subjective:    Patient ID: Stephanie Davies, female    DOB: 11-06-1975, 40 y.o.   MRN: [id number]  DOS:  09/12/2015 Type of visit - description : Acute visit Interval history- 5 months history of a rash on the right neck,  severely itchy, getting larger. Does not use neck jewerly. Also, she has thyromegaly, biopsy was never done per chart review.    Review of Systems   Past Medical History:  Diagnosis Date  . Alopecia   . Anxiety and depression 03/07/2014  . GERD (gastroesophageal reflux disease) 02/14/2014  . Goiter 03/07/2014  . Headache(784.0)   . Hemorrhoids   . Kidney stone   . Pseudotumor cerebri     Past Surgical History:  Procedure Laterality Date  . CHOLECYSTECTOMY  2004    Social History   Social History  . Marital status: Married    Spouse name: N/A  . Number of children: 1  . Years of education: N/A   Occupational History  . stay home    Social History Main Topics  . Smoking status: Never Smoker  . Smokeless tobacco: Never Used  . Alcohol use No  . Drug use: No  . Sexual activity: Not Currently    Birth control/ protection: None   Other Topics Concern  . 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 Always use your most recent med list.          ALPRAZolam 0.5 MG tablet Commonly known as:  XANAX Take 1 tablet (0.5 mg total) by mouth 4 (four) times daily as needed for anxiety.   augmented betamethasone dipropionate 0.05 % cream Commonly known as:  DIPROLENE-AF Apply topically 2 (two) times daily.   citalopram 20 MG tablet Commonly known as:  CELEXA Take 2 tablets (40 mg total) by mouth daily.   fluticasone furoate-vilanterol 100-25 MCG/INH Aepb Commonly known as:  BREO ELLIPTA Inhale 1 puff into the lungs daily.   pantoprazole 40 MG tablet Commonly known as:  PROTONIX Take 1  tablet (40 mg total) by mouth daily.   triamcinolone cream 0.1 % Commonly known as:  KENALOG Apply 1 application topically 2 (two) times daily.          Objective:   Physical Exam BP 112/74 (BP Location: Left Arm, Patient Position: Sitting, Cuff Size: Normal)   Pulse 72   Temp 98 F (36.7 C) (Oral)   Resp 14   Ht '5\' 6"'$  (1.676 m)   Wt 262 lb (118.8 kg)   LMP 09/03/2015 (Exact Date)   SpO2 97%   BMI 42.29 kg/m  General:   Well developed, well nourished . NAD.  HEENT:  Normocephalic . Face symmetric, atraumatic Neck: + Thyromegaly, nontender, firm. Does not seem to be nodular. Skin-- has a patch of thick skin at the right side of the neck, slightly hyperpigmented, laterally  the borders are elevated and slightly scaly. See pictures. Neurologic:  alert & oriented X3.  Speech normal, gait appropriate for age and unassisted Psych--  Cognition and judgment appear intact.  Cooperative with normal attention span and concentration.  Behavior appropriate. No anxious or depressed appearing.             Assessment & Plan:    Assessment Anxiety, depression Several tumor  cerebri, headaches Goiter : [place] 03-2014, bx recommended , not done as of 08-2015   PLAN: Rash: fungal?  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 Plan: Repeat ultrasound, proceed with biopsy if still necessary, check TFTs

## 2015-09-14 MED ORDER — KETOCONAZOLE 2 % EX CREA: 1.0000 "application " | TOPICAL_CREAM | Freq: Every day | CUTANEOUS | 0 refills | Status: DC

## 2015-09-14 NOTE — Assessment & Plan Note (Signed)
Rash: fungal?  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 Plan: Repeat ultrasound, proceed with biopsy if still necessary, check TFTs

## 2015-09-16 ENCOUNTER — Telehealth: Payer: Self-pay

## 2015-09-16 NOTE — Telephone Encounter (Signed)
-----   Message from Wanda PlumpJose E Paz, MD sent at 09/14/2015  1:21 PM EDT ----- Regarding: call Advise patient that instead of sending her to dermatology I prefer her to use 2 creams: The steroid and nizoral, I sent a prescription, mix them  and used twice a day for 10 days. If not improving call for referral

## 2015-09-16 NOTE — Telephone Encounter (Signed)
LMOM informing Pt to return call.  

## 2015-09-26 ENCOUNTER — Other Ambulatory Visit: Payer: Self-pay | Admitting: Surgery

## 2015-10-03 ENCOUNTER — Encounter: Payer: Self-pay | Admitting: Family Medicine

## 2015-10-03 ENCOUNTER — Ambulatory Visit (HOSPITAL_BASED_OUTPATIENT_CLINIC_OR_DEPARTMENT_OTHER)
Admission: RE | Admit: 2015-10-03 | Discharge: 2015-10-03 | Disposition: A | Discharge status: Home / Self Care | Payer: Medicaid Other | Source: Ambulatory Visit | Attending: Internal Medicine | Admitting: Internal Medicine

## 2015-10-03 ENCOUNTER — Ambulatory Visit (INDEPENDENT_AMBULATORY_CARE_PROVIDER_SITE_OTHER): Payer: Medicaid Other | Admitting: Family Medicine

## 2015-10-03 VITALS — BP 103/71 | HR 70 | Temp 98.2°F | Resp 16 | Ht 66.0 in | Wt 265.0 lb

## 2015-10-03 DIAGNOSIS — J209 Acute bronchitis, unspecified: Secondary | ICD-10-CM | POA: Diagnosis not present

## 2015-10-03 DIAGNOSIS — J069 Acute upper respiratory infection, unspecified: Secondary | ICD-10-CM | POA: Diagnosis not present

## 2015-10-03 DIAGNOSIS — E049 Nontoxic goiter, unspecified: Secondary | ICD-10-CM | POA: Diagnosis present

## 2015-10-03 MED ORDER — HYDROCODONE-ACETAMINOPHEN 5-325 MG PO TABS: 1.0000 | ORAL_TABLET | Freq: Four times a day (QID) | ORAL | 0 refills | Status: DC | PRN

## 2015-10-03 MED ORDER — METHYLPREDNISOLONE ACETATE 80 MG/ML IJ SUSP
80.0000 mg | Freq: Once | INTRAMUSCULAR | Status: AC
  Administered 2015-10-03: 80 mg via INTRAMUSCULAR

## 2015-10-03 MED ORDER — METHYLPREDNISOLONE ACETATE 40 MG/ML IJ SUSP
40.0000 mg | Freq: Once | INTRAMUSCULAR | Status: DC
Start: 2015-10-03 — End: 2015-10-03

## 2015-10-03 NOTE — Progress Notes (Signed)
OFFICE VISIT  10/03/2015   CC:  Chief Complaint  Patient presents with  . Cough    unable to sleep due to cough  . Nasal Congestion  . Headache    tylenol, iburprofen no working    HPI:    Patient is a 40 y.o.  female who presents for cough. Onset of runny nose 5 d/a, cough started 2 d/a.  Cough productive of minimal mucous.  HA present and tylenol/ibup doesn't help.  Feels some throat irritation but no soreness.  No fever. No wheezing or SOB.  She is not a smoker.  No OTC cold meds tried.   Past Medical History:  Diagnosis Date  . Alopecia   . Anal fistula   . Anxiety and depression 03/07/2014  . GERD (gastroesophageal reflux disease) 02/14/2014  . Goiter 03/07/2014  . Headache(784.0)   . Hemorrhoids   . Kidney stone   . Pseudotumor cerebri     Past Surgical History:  Procedure Laterality Date  . CHOLECYSTECTOMY  2004    Outpatient Medications Prior to Visit  Medication Sig Dispense Refill  . ALPRAZolam (XANAX) 0.5 MG tablet Take 1 tablet (0.5 mg total) by mouth 4 (four) times daily as needed for anxiety. 40 tablet 0  . augmented betamethasone dipropionate (DIPROLENE-AF) 0.05 % cream Apply topically 2 (two) times daily. 60 g 0  . citalopram (CELEXA) 20 MG tablet Take 2 tablets (40 mg total) by mouth daily. 60 tablet 5  . fluticasone furoate-vilanterol (BREO ELLIPTA) 100-25 MCG/INH AEPB Inhale 1 puff into the lungs daily. 30 each 5  . ketoconazole (NIZORAL) 2 % cream Apply 1 application topically daily. 30 g 0  . pantoprazole (PROTONIX) 40 MG tablet Take 1 tablet (40 mg total) by mouth daily. (Patient not taking: Reported on 10/03/2015) 30 tablet 0  . triamcinolone cream (KENALOG) 0.1 % Apply 1 application topically 2 (two) times daily. (Patient not taking: Reported on 10/03/2015) 45 g 0   No facility-administered medications prior to visit.     No Known Allergies  ROS As per HPI  PE: Blood pressure 103/71, pulse 70, temperature 98.2 F (36.8 C), temperature source  Oral, resp. rate 16, height 5\' 6"  (1.676 m), weight 265 lb (120.2 kg), last menstrual period 09/03/2015, SpO2 98 %. VS: noted--normal. Gen: alert, NAD, NONTOXIC APPEARING. HEENT: eyes without injection, drainage, or swelling.  Ears: EACs clear, TMs with normal light reflex and landmarks.  Nose: Clear rhinorrhea, with some dried, crusty exudate adherent to mildly injected mucosa.  No purulent d/c.  No paranasal sinus TTP.  No facial swelling.  Throat and mouth without focal lesion.  No pharyngial swelling, erythema, or exudate.   Neck: supple, no LAD.   LUNGS: CTA bilat, nonlabored resps.  She has excessive coughing with forced exhalation.  Even coughs easily with normal expiration. CV: RRR, no m/r/g. EXT: no c/c/e SKIN: no rash   LABS:  none  IMPRESSION AND PLAN:  Viral URI with acute bronchitis. Having significant HA with this illness. Depo-medrol 80mg  IM today in office. Vicodin 5/325, 1-2 q6h prn, #20.  Therapeutic expectations and side effect profile of medication discussed today.  Patient's questions answered. Get otc generic robitussin DM OR Mucinex DM and use as directed on the packaging for cough and congestion. Use otc generic saline nasal spray 2-3 times per day to irrigate/moisturize your nasal passages.  An After Visit Summary was printed and given to the patient.  FOLLOW UP: Return if symptoms worsen or fail to improve.  Signed:  Santiago BumpersPhil McGowen, MD           10/03/2015

## 2015-10-03 NOTE — Addendum Note (Signed)
Addended byConrad Erath: Houa Ackert D on: 10/03/2015 04:00 PM   Modules accepted: Orders

## 2015-10-03 NOTE — Progress Notes (Signed)
Pre visit review using our clinic review tool, if applicable. No additional management support is needed unless otherwise documented below in the visit note. 

## 2015-10-03 NOTE — Patient Instructions (Signed)
Get otc generic robitussin DM OR Mucinex DM and use as directed on the packaging for cough and congestion. Use otc generic saline nasal spray 2-3 times per day to irrigate/moisturize your nasal passages.   

## 2015-11-13 ENCOUNTER — Telehealth: Payer: Self-pay | Admitting: Internal Medicine

## 2015-11-13 NOTE — Telephone Encounter (Signed)
Please advise 

## 2015-11-13 NOTE — Telephone Encounter (Signed)
Patient is calling about a referral for a colposcopy. Please advise.   Phone: [phone]

## 2015-11-13 NOTE — Telephone Encounter (Signed)
Please clarify: Colonoscopy? Colposcopy? Reason for  Exam?

## 2015-11-14 NOTE — Telephone Encounter (Signed)
LMOM informing Pt to return call, needing clarification if needing colposcopy or colonoscopy and for what reason for referral/order purposes.

## 2015-11-26 ENCOUNTER — Inpatient Hospital Stay: Admission: RE | Admit: 2015-11-26 | Payer: Medicaid Other | Source: Ambulatory Visit

## 2015-12-01 ENCOUNTER — Telehealth: Payer: Self-pay | Admitting: Internal Medicine

## 2015-12-01 NOTE — Telephone Encounter (Signed)
Biopsy order placed on 10/03/2015.

## 2015-12-01 NOTE — Telephone Encounter (Signed)
Caller name:Vickie with [facility] Relationship to patient: Can be reached:[phone] Pharmacy:  Reason for call:Need order for Thyroid BX, having done tomorrow. Please fax order to [phone]

## 2015-12-01 NOTE — Telephone Encounter (Signed)
Lm on vm with Vickie, awaiting return call

## 2015-12-02 ENCOUNTER — Telehealth: Payer: Self-pay | Admitting: Internal Medicine

## 2015-12-02 NOTE — Telephone Encounter (Signed)
Vickie - [facility] called in because she says that pt is have a biopsy done tomorrow and she don't see the orders.    Spoke with PCP assistant and was advised that orders were placed on 10/03/15 and is attached to appt.    Informed Vickie , she says that she don't see them and would need to have them faxed to her at fax # [phone]

## 2015-12-02 NOTE — Telephone Encounter (Signed)
Orders faxed to ATTN: Vickie at [phone].

## 2015-12-02 NOTE — Telephone Encounter (Signed)
Received fax confirmation on 12/02/2015 at 1314.

## 2015-12-03 ENCOUNTER — Other Ambulatory Visit (HOSPITAL_COMMUNITY)
Admission: RE | Admit: 2015-12-03 | Discharge: 2015-12-03 | Disposition: A | Discharge status: Home / Self Care | Payer: Medicaid Other | Source: Ambulatory Visit | Attending: General Surgery | Admitting: General Surgery

## 2015-12-03 ENCOUNTER — Ambulatory Visit
Admission: RE | Admit: 2015-12-03 | Discharge: 2015-12-03 | Disposition: A | Discharge status: Home / Self Care | Payer: Medicaid Other | Source: Ambulatory Visit | Attending: Internal Medicine | Admitting: Internal Medicine

## 2015-12-03 DIAGNOSIS — E041 Nontoxic single thyroid nodule: Secondary | ICD-10-CM | POA: Diagnosis not present

## 2015-12-09 ENCOUNTER — Ambulatory Visit (INDEPENDENT_AMBULATORY_CARE_PROVIDER_SITE_OTHER): Payer: Medicaid Other | Admitting: Family Medicine

## 2015-12-09 ENCOUNTER — Encounter: Payer: Self-pay | Admitting: Family Medicine

## 2015-12-09 ENCOUNTER — Encounter: Payer: Self-pay | Admitting: *Deleted

## 2015-12-09 VITALS — BP 102/68 | HR 92 | Temp 98.0°F | Ht 66.0 in | Wt 263.8 lb

## 2015-12-09 DIAGNOSIS — H6692 Otitis media, unspecified, left ear: Secondary | ICD-10-CM

## 2015-12-09 DIAGNOSIS — R6889 Other general symptoms and signs: Secondary | ICD-10-CM

## 2015-12-09 LAB — POC INFLUENZA A&B (BINAX/QUICKVUE): Influenza A, POC: NEGATIVE

## 2015-12-09 MED ORDER — BENZONATATE 100 MG PO CAPS: 100.0000 mg | ORAL_CAPSULE | Freq: Two times a day (BID) | ORAL | 0 refills | Status: DC | PRN

## 2015-12-09 MED ORDER — AMOXICILLIN 875 MG PO TABS: 875.0000 mg | ORAL_TABLET | Freq: Two times a day (BID) | ORAL | 0 refills | Status: DC

## 2015-12-09 NOTE — Addendum Note (Signed)
Addended by: Johnella MoloneyFUNDERBURK, Wilborn Membreno A on: 12/09/2015 11:49 AM   Modules accepted: Orders

## 2015-12-09 NOTE — Progress Notes (Signed)
HPI:  Acute visit for flu like symptoms: -started: 3-4 days ago -symptoms:lots of nasal congestion, sore throat, sinus ha, L ear pain, cough, body aches, nause, vomiting x2 yesterday, loose stools x3 yesterday, temp 100 3 days ago, no fevers since -denies:fever >100.4, SOB,  tooth pain -has tried: afrin, over the counter cough and cold medications -sick contacts/travel/risks: no reported flu, strep or tick exposure - family member with similar symptoms   ROS: See pertinent positives and negatives per HPI.  Past Medical History:  Diagnosis Date  . Alopecia   . Anal fistula   . Anxiety and depression 03/07/2014  . GERD (gastroesophageal reflux disease) 02/14/2014  . Goiter 03/07/2014  . Headache(784.0)   . Hemorrhoids   . Kidney stone   . Pseudotumor cerebri     Past Surgical History:  Procedure Laterality Date  . CHOLECYSTECTOMY  2004    Family History  Problem Relation Age of Onset  . Diabetes Mother   . Hypertension Mother   . Esophageal cancer Maternal Grandmother   . Esophageal cancer Maternal Grandfather   . Diabetes Maternal Grandfather   . Colon cancer Neg Hx   . Colon polyps Neg Hx   . Gallbladder disease Neg Hx   . Kidney disease Neg Hx     Social History   Social History  . Marital status: Married    Spouse name: N/A  . Number of children: 1  . Years of education: N/A   Occupational History  . stay home    Social History Main Topics  . Smoking status: Never Smoker  . Smokeless tobacco: Never Used  . Alcohol use No  . Drug use: No  . Sexual activity: Not Currently    Birth control/ protection: None   Other Topics Concern  . 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  ALPRAZolam (XANAX) 0.5 MG tablet, Take 1 tablet (0.5 mg total) by mouth 4 (four) times daily as needed for anxiety., Disp: 40 tablet, Rfl: 0 .   augmented betamethasone dipropionate (DIPROLENE-AF) 0.05 % cream, Apply topically 2 (two) times daily., Disp: 60 g, Rfl: 0 .  citalopram (CELEXA) 20 MG tablet, Take 2 tablets (40 mg total) by mouth daily., Disp: 60 tablet, Rfl: 5 .  fluticasone furoate-vilanterol (BREO ELLIPTA) 100-25 MCG/INH AEPB, Inhale 1 puff into the lungs daily., Disp: 30 each, Rfl: 5 .  HYDROcodone-acetaminophen (NORCO/VICODIN) 5-325 MG tablet, Take 1-2 tablets by mouth every 6 (six) hours as needed for moderate pain., Disp: 20 tablet, Rfl: 0 .  ketoconazole (NIZORAL) 2 % cream, Apply 1 application topically daily., Disp: 30 g, Rfl: 0 .  pantoprazole (PROTONIX) 40 MG tablet, Take 1 tablet (40 mg total) by mouth daily., Disp: 30 tablet, Rfl: 0 .  triamcinolone cream (KENALOG) 0.1 %, Apply 1 application topically 2 (two) times daily., Disp: 45 g, Rfl: 0 .  amoxicillin (AMOXIL) 875 MG tablet, Take 1 tablet (875 mg total) by mouth 2 (two) times daily., Disp: 20 tablet, Rfl: 0 .  benzonatate (TESSALON) 100 MG capsule, Take 1 capsule (100 mg total) by mouth 2 (two) times daily as needed for cough., Disp: 20 capsule, Rfl: 0  EXAM:  Vitals:   12/09/15 1007  BP: 102/68  Pulse: 92  Temp: 98 F (36.7 C)    Body mass index is 42.58 kg/m.  GENERAL: vitals  reviewed and listed above, alert, oriented, appears well hydrated and in no acute distress  HEENT: atraumatic, conjunttiva clear, no obvious abnormalities on inspection of external nose and ears, normal appearance of ear canals and TMs except for L TM bulging, red with purulent appearing effusion w/out perforation, lots of clear nasal congestion, mild post oropharyngeal erythema with PND, no tonsillar edema or exudate, no sinus TTP  NECK: no obvious masses on inspection  LUNGS: clear to auscultation bilaterally, no wheezes, rales or rhonchi, good air movement  CV: HRRR, no peripheral edema  MS: moves all extremities without noticeable abnormality  PSYCH: pleasant and  cooperative, no obvious depression or anxiety  ASSESSMENT AND PLAN:  Discussed the following assessment and plan:  Flu-like symptoms  Left otitis media, unspecified otitis media type  - We discussed potential etiologies, with mild flu or other VURI with 2ndary L otitis media being most likely. We discussed treatment options including tamiflu with likely not much benefit given duration of symptoms and since flu test neg opted against, side effects, likely course,  transmission, and signs of developing a serious illness. Opted to treat om with amoxicillin. Advise many many options for symptomatic/supportive care. Advised plenty of fluids for oral hydration. She was not happy that her symptoms may continue and that there is not a pill to take to immediately alleviate the upper resp symptoms. -of course, we advised to return or notify a doctor immediately if symptoms worsen or persist or new concerns arise.    Patient Instructions  BEFORE YOU LEAVE: -school work note  Please use humidifier at night and nasal saline several times daily.  I sent the antibiotic to the pharmacy.  Follow up with your doctor or seek care immediately if worsening or not improving over the next several days.    Otitis Media, Adult Otitis media is redness, soreness, and puffiness (swelling) in the space just behind your eardrum (middle ear). It may be caused by allergies or infection. It often happens along with a cold. Follow these instructions at home:  Take your medicine as told. Finish it even if you start to feel better.  Only take over-the-counter or prescription medicines for pain, discomfort, or fever as told by your doctor.  Follow up with your doctor as told. Contact a doctor if:  You have otitis media only in one ear, or bleeding from your nose, or both.  You notice a lump on your neck.  You are not getting better in 3-5 days.  You feel worse instead of better. Get help right away  if:  You have pain that is not helped with medicine.  You have puffiness, redness, or pain around your ear.  You get a stiff neck.  You cannot move part of your face (paralysis).  You notice that the bone behind your ear hurts when you touch it. This information is not intended to replace advice given to you by your health care provider. Make sure you discuss any questions you have with your health care provider. Document Released: 06/16/2007 Document Revised: 06/05/2015 Document Reviewed: 07/25/2012 Elsevier Interactive Patient Education  2017 Elsevier Inc.    Influenza like illness, Adult Influenza ("the flu") is an infection in the lungs, nose, and throat (respiratory tract). It is caused by a virus. The flu causes many common cold symptoms, as well as a high fever and body aches. It can make you feel very sick. The flu spreads easily from person to person (is contagious). Getting a flu shot (influenza  vaccination) every year is the best way to prevent the flu. Follow these instructions at home:  Take over-the-counter and prescription medicines only as told by your doctor.  Use a cool mist humidifier to add moisture (humidity) to the air in your home. This can make it easier to breathe.  Rest as needed.  Drink enough fluid to keep your pee (urine) clear or pale yellow.  Cover your mouth and nose when you cough or sneeze.  Wash your hands with soap and water often, especially after you cough or sneeze. If you cannot use soap and water, use hand sanitizer.  Stay home from work or school if having fever over 100, vomiting or diarrhea, as told by your doctor. Unless you are visiting your doctor, try to avoid leaving home until your fever has been gone for 24 hours without the use of medicine.  Keep all follow-up visits as told by your doctor. This is important. How is this prevented?  Getting a yearly (annual) flu shot is the best way to avoid getting the flu. You may get the  flu shot in late summer, fall, or winter. Ask your doctor when you should get your flu shot.  Wash your hands often or use hand sanitizer often.  Avoid contact with people who are sick during cold and flu season.  Eat healthy foods.  Drink plenty of fluids.  Get enough sleep.  Exercise regularly. Contact a doctor if:  You get new or worsening symptoms Get help right away if:  You start to be short of breath or have trouble breathing.  Your skin or nails turn a bluish color.  You have very bad pain or stiffness in your neck.  You get a sudden headache.  You get sudden pain in your face or ear.  You cannot stop throwing up. This information is not intended to replace advice given to you by your health care provider. Make sure you discuss any questions you have with your health care provider. Document Released: 10/07/2007 Document Revised: 06/05/2015 Document Reviewed: 10/22/2014 Elsevier Interactive Patient Education  [address]2017 Elsevier Inc.    [place]araKIM, Dahlia ClientHANNAH R., DO

## 2015-12-09 NOTE — Progress Notes (Signed)
Pre visit review using our clinic review tool, if applicable. No additional management support is needed unless otherwise documented below in the visit note. 

## 2015-12-09 NOTE — Patient Instructions (Addendum)
BEFORE YOU LEAVE: -school work note  Please use humidifier at night and nasal saline several times daily.  I sent the antibiotic and a cough medication to the pharmacy.  Follow up with your doctor or seek care immediately if worsening or not improving over the next several days.    Otitis Media, Adult Otitis media is redness, soreness, and puffiness (swelling) in the space just behind your eardrum (middle ear). It may be caused by allergies or infection. It often happens along with a cold. Follow these instructions at home:  Take your medicine as told. Finish it even if you start to feel better.  Only take over-the-counter or prescription medicines for pain, discomfort, or fever as told by your doctor.  Follow up with your doctor as told. Contact a doctor if:  You have otitis media only in one ear, or bleeding from your nose, or both.  You notice a lump on your neck.  You are not getting better in 3-5 days.  You feel worse instead of better. Get help right away if:  You have pain that is not helped with medicine.  You have puffiness, redness, or pain around your ear.  You get a stiff neck.  You cannot move part of your face (paralysis).  You notice that the bone behind your ear hurts when you touch it. This information is not intended to replace advice given to you by your health care provider. Make sure you discuss any questions you have with your health care provider. Document Released: 06/16/2007 Document Revised: 06/05/2015 Document Reviewed: 07/25/2012 Elsevier Interactive Patient Education  2017 Elsevier Inc.    Influenza like illness, Adult Influenza ("the flu") is an infection in the lungs, nose, and throat (respiratory tract). It is caused by a virus. The flu causes many common cold symptoms, as well as a high fever and body aches. It can make you feel very sick. The flu spreads easily from person to person (is contagious). Getting a flu shot (influenza  vaccination) every year is the best way to prevent the flu. Follow these instructions at home:  Take over-the-counter and prescription medicines only as told by your doctor.  Use a cool mist humidifier to add moisture (humidity) to the air in your home. This can make it easier to breathe.  Rest as needed.  Drink enough fluid to keep your pee (urine) clear or pale yellow.  Cover your mouth and nose when you cough or sneeze.  Wash your hands with soap and water often, especially after you cough or sneeze. If you cannot use soap and water, use hand sanitizer.  Stay home from work or school if having fever over 100, vomiting or diarrhea, as told by your doctor. Unless you are visiting your doctor, try to avoid leaving home until your fever has been gone for 24 hours without the use of medicine.  Keep all follow-up visits as told by your doctor. This is important. How is this prevented?  Getting a yearly (annual) flu shot is the best way to avoid getting the flu. You may get the flu shot in late summer, fall, or winter. Ask your doctor when you should get your flu shot.  Wash your hands often or use hand sanitizer often.  Avoid contact with people who are sick during cold and flu season.  Eat healthy foods.  Drink plenty of fluids.  Get enough sleep.  Exercise regularly. Contact a doctor if:  You get new or worsening symptoms Get help right  away if:  You start to be short of breath or have trouble breathing.  Your skin or nails turn a bluish color.  You have very bad pain or stiffness in your neck.  You get a sudden headache.  You get sudden pain in your face or ear.  You cannot stop throwing up. This information is not intended to replace advice given to you by your health care provider. Make sure you discuss any questions you have with your health care provider. Document Released: 10/07/2007 Document Revised: 06/05/2015 Document Reviewed: 10/22/2014 Elsevier  Interactive Patient Education  2017 [employer]

## 2015-12-11 ENCOUNTER — Telehealth: Payer: Self-pay | Admitting: Internal Medicine

## 2015-12-11 NOTE — Telephone Encounter (Signed)
Please advise 

## 2015-12-11 NOTE — Telephone Encounter (Signed)
Patient is calling regarding her thyroid ultrasound results. Please advise.    Patient phone: [phone]

## 2015-12-12 NOTE — Telephone Encounter (Signed)
We sent a letter, she should be getting it soon. Biopsy was benign, no cancer. Good results. Let her know

## 2015-12-12 NOTE — Telephone Encounter (Signed)
LMOM informing Pt of results. Instructed Pt to call if questions/concerns.

## 2016-02-25 ENCOUNTER — Encounter: Payer: Self-pay | Admitting: Internal Medicine

## 2016-02-25 ENCOUNTER — Ambulatory Visit (HOSPITAL_BASED_OUTPATIENT_CLINIC_OR_DEPARTMENT_OTHER)
Admission: RE | Admit: 2016-02-25 | Discharge: 2016-02-25 | Disposition: A | Discharge status: Home / Self Care | Payer: Medicaid Other | Source: Ambulatory Visit | Attending: Internal Medicine | Admitting: Internal Medicine

## 2016-02-25 ENCOUNTER — Ambulatory Visit (INDEPENDENT_AMBULATORY_CARE_PROVIDER_SITE_OTHER): Payer: Medicaid Other | Admitting: Internal Medicine

## 2016-02-25 VITALS — BP 124/68 | HR 94 | Temp 98.1°F | Resp 12 | Ht 66.0 in | Wt 269.2 lb

## 2016-02-25 DIAGNOSIS — M47896 Other spondylosis, lumbar region: Secondary | ICD-10-CM | POA: Insufficient documentation

## 2016-02-25 DIAGNOSIS — S20229A Contusion of unspecified back wall of thorax, initial encounter: Secondary | ICD-10-CM

## 2016-02-25 DIAGNOSIS — W19XXXA Unspecified fall, initial encounter: Secondary | ICD-10-CM | POA: Diagnosis not present

## 2016-02-25 MED ORDER — PANTOPRAZOLE SODIUM 40 MG PO TBEC: 40.0000 mg | DELAYED_RELEASE_TABLET | Freq: Every day | ORAL | 5 refills | Status: DC

## 2016-02-25 MED ORDER — CELECOXIB 200 MG PO CAPS: 200.0000 mg | ORAL_CAPSULE | Freq: Every day | ORAL | 1 refills | Status: DC | PRN

## 2016-02-25 NOTE — Patient Instructions (Signed)
Get the x-rays downstairs   Get a doughnut pillow  Icepack twice a day for the next 10 days  For pain: Celebrex daily as needed for pain.  Always take it with food because may cause gastritis and ulcers.  If you notice nausea, stomach pain, change in the color of stools --->  Stop the medicine and let us know  Tylenol  500 mg OTC 2 tabs a day every 8 hours as needed for pain   If you are not gradually improving in the next few weeks please let me know

## 2016-02-25 NOTE — Progress Notes (Signed)
Pre visit review using our clinic review tool, if applicable. No additional management support is needed unless otherwise documented below in the visit note. 

## 2016-02-25 NOTE — Progress Notes (Signed)
Subjective:    Patient ID: Stephanie Davies, female    DOB: [birth year], 41 y.o.   MRN: [id number]  DOS:  02/25/2016 Type of visit - description : Acute visit Interval history:  3 weeks ago, was playing with her daughter, she fell and landed on her buttocks. Has severe pain since then at the "tail bone"  area, worse if she sits straight. No radiation, no swelling or bruising  Review of Systems  Denies lower extremity paresthesias or neck injury- pain.  Past Medical History:  Diagnosis Date  . Alopecia   . Anal fistula   . Anxiety and depression 03/07/2014  . GERD (gastroesophageal reflux disease) 02/14/2014  . Goiter 03/07/2014  . Headache(784.0)   . Hemorrhoids   . Kidney stone   . Pseudotumor cerebri     Past Surgical History:  Procedure Laterality Date  . CHOLECYSTECTOMY  2004    Social History   Social History  . Marital status: Married    Spouse name: N/A  . Number of children: 1  . Years of education: N/A   Occupational History  . stay home    Social History Main Topics  . Smoking status: Never Smoker  . Smokeless tobacco: Never Used  . Alcohol use No  . Drug use: No  . Sexual activity: Not Currently    Birth control/ protection: None   Other Topics Concern  . 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 Always use your most recent med list.          ALPRAZolam 0.5 MG tablet Commonly known as:  XANAX Take 1 tablet (0.5 mg total) by mouth 4 (four) times daily as needed for anxiety.   celecoxib 200 MG capsule Commonly known as:  CELEBREX Take 1 capsule (200 mg total) by mouth daily as needed.   citalopram 20 MG tablet Commonly known as:  CELEXA Take 2 tablets (40 mg total) by mouth daily.   pantoprazole 40 MG tablet Commonly known as:   PROTONIX Take 1 tablet (40 mg total) by mouth daily.          Objective:   Physical Exam  Musculoskeletal:       Back:   BP 124/68 (BP Location: Left Arm, Patient Position: Sitting, Cuff Size: Normal)   Pulse 94   Temp 98.1 F (36.7 C) (Oral)   Resp 12   Ht  (1.676 m)   Wt 269 lb 4 oz (122.1 kg)   LMP 01/28/2016 (Exact Date)   SpO2 97%   BMI 43.46 kg/m  General:   Well developed, well nourished . NAD.  HEENT:  Normocephalic . Face symmetric, atraumatic  Skin: Not pale. Not jaundice. No bruising at the back or buttocks Neurologic:  alert & oriented X3.  Speech normal, gait and posture antalgic. DTRs and motor symmetric Psych--  Cognition and judgment appear intact.  Cooperative with normal attention span and concentration.  Behavior appropriate. No anxious or depressed appearing.      Assessment & Plan:   Assessment Anxiety, depression Several tumor cerebri, headaches Goiter : [place] 03-2014, bx  Done 11-2015   PLAN: Back/coccyx contusion: Will get x-ray to rule out a fracture but otherwise  explained the patient that this will take several weeks to resolve. Recommend ice pack, Celebrex, Tylenol. Patient is not on BCP, LMP 01/28/2016, she's not sexually active and strongly refuses a UPT. Anxiety depression: Currently well controlled On PPIs, needs a refill.

## 2016-02-26 NOTE — Assessment & Plan Note (Addendum)
Back/coccyx contusion: Will get x-ray to rule out a fracture but otherwise explained the patient that this will take several weeks to resolve. Recommend ice pack, Celebrex, Tylenol. Patient is not on BCP, LMP 01/28/2016, she's not sexually active and strongly refuses a UPT. Anxiety depression: Currently well controlled On PPIs, needs a refill.

## 2016-03-02 ENCOUNTER — Ambulatory Visit: Payer: Medicaid Other | Admitting: Internal Medicine

## 2016-03-04 ENCOUNTER — Ambulatory Visit (HOSPITAL_BASED_OUTPATIENT_CLINIC_OR_DEPARTMENT_OTHER)
Admission: RE | Admit: 2016-03-04 | Discharge: 2016-03-04 | Disposition: A | Discharge status: Home / Self Care | Payer: Medicaid Other | Source: Ambulatory Visit | Attending: Internal Medicine | Admitting: Internal Medicine

## 2016-03-04 ENCOUNTER — Ambulatory Visit (INDEPENDENT_AMBULATORY_CARE_PROVIDER_SITE_OTHER): Payer: Medicaid Other | Admitting: Internal Medicine

## 2016-03-04 ENCOUNTER — Encounter: Payer: Self-pay | Admitting: Internal Medicine

## 2016-03-04 VITALS — BP 122/80 | HR 79 | Temp 98.1°F | Resp 14 | Ht 66.0 in | Wt 269.2 lb

## 2016-03-04 DIAGNOSIS — W19XXXD Unspecified fall, subsequent encounter: Secondary | ICD-10-CM

## 2016-03-04 DIAGNOSIS — S8001XA Contusion of right knee, initial encounter: Secondary | ICD-10-CM | POA: Diagnosis present

## 2016-03-04 DIAGNOSIS — W19XXXA Unspecified fall, initial encounter: Secondary | ICD-10-CM | POA: Diagnosis not present

## 2016-03-04 NOTE — Progress Notes (Signed)
Pre visit review using our clinic review tool, if applicable. No additional management support is needed unless otherwise documented below in the visit note. 

## 2016-03-04 NOTE — Patient Instructions (Signed)
Get your x-rays downstairs  Ice  the knee every night  We are referring you to the sports medicine doctor

## 2016-03-04 NOTE — Progress Notes (Signed)
Subjective:    Patient ID: Stephanie Davies, female    DOB: 1975-01-12, 41 y.o.   MRN: [id number]  DOS:  03/04/2016 Type of visit - description : Acute visit Interval history: About 5 weeks ago, she fell, landed on her right knee. Since then is having significant pain, very noticeable after she stands > 1 hour. Sometimes she has difficulties flexing her knee all the way. Most of the pain is in the posterior aspect of the knee.   Review of Systems   denies any swelling or warmness of the knee.   Past Medical History:  Diagnosis Date  . Alopecia   . Anal fistula   . Anxiety and depression 03/07/2014  . GERD (gastroesophageal reflux disease) 02/14/2014  . Goiter 03/07/2014  . Headache(784.0)   . Hemorrhoids   . Kidney stone   . Pseudotumor cerebri     Past Surgical History:  Procedure Laterality Date  . CHOLECYSTECTOMY  2004    Social History   Social History  . Marital status: Married    Spouse name: N/A  . Number of children: 1  . Years of education: N/A   Occupational History  . stay home    Social History Main Topics  . Smoking status: Never Smoker  . Smokeless tobacco: Never Used  . Alcohol use No  . Drug use: No  . Sexual activity: Not Currently    Birth control/ protection: None   Other Topics Concern  . 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 Always use your most recent med list.          ALPRAZolam 0.5 MG tablet Commonly known as:  XANAX Take 1 tablet (0.5 mg total) by mouth 4 (four) times daily as needed for anxiety.   celecoxib 200 MG capsule Commonly known as:  CELEBREX Take 1 capsule (200 mg total) by mouth daily as needed.   citalopram 20 MG tablet Commonly known as:  CELEXA Take 2 tablets (40 mg total) by mouth daily.     pantoprazole 40 MG tablet Commonly known as:  PROTONIX Take 1 tablet (40 mg total) by mouth daily.          Objective:   Physical Exam BP 122/80 (BP Location: Left Arm, Patient Position: Sitting, Cuff Size: Normal)   Pulse 79   Temp 98.1 F (36.7 C) (Oral)   Resp 14   Ht 5\' 6"  (1.676 m)   Wt 269 lb 4 oz (122.1 kg)   LMP 03/02/2016   SpO2 98%   BMI 43.46 kg/m  General:   Well developed, well nourished . NAD.  HEENT:  Normocephalic . Face symmetric, atraumatic  MSK: Left knee normal Right knee: No deformity, swelling, redness. Patella seems intact. Some TTP anteriorly. Neurologic:  alert & oriented X3.  Speech normal, gait limited by knee pain  Psych--  Cognition and judgment appear intact.  Cooperative with normal attention span and concentration.  Behavior appropriate. No anxious or depressed appearing.      Assessment & Plan:   Assessment Anxiety, depression Several tumor cerebri, headaches Goiter : [place] 03-2014, bx  Done 11-2015   PLAN: Right knee pain: After a fall 5 weeks ago,  exam is benign but she definitely seems to have some trouble with her gait. Will get an x-ray and refer her to sports medicine. Ice therapy recommended. Back/coccyx contusion and coccyx Fx per XR 02-25-16. Request a letter to excuse her from jury duty which I think is very reasonable

## 2016-03-04 NOTE — Assessment & Plan Note (Signed)
Right knee pain: After a fall 5 weeks ago, exam is benign but she definitely seems to have some trouble with her gait. Will get an x-ray and refer her to sports medicine. Ice therapy recommended. Back/coccyx contusion and coccyx Fx per XR 02-25-16. Request a letter to excuse her from jury duty which I think is very reasonable

## 2016-03-05 ENCOUNTER — Ambulatory Visit: Payer: Medicaid Other | Admitting: Internal Medicine

## 2016-03-10 ENCOUNTER — Ambulatory Visit (INDEPENDENT_AMBULATORY_CARE_PROVIDER_SITE_OTHER): Payer: Medicaid Other | Admitting: Family Medicine

## 2016-03-10 ENCOUNTER — Encounter: Payer: Self-pay | Admitting: Family Medicine

## 2016-03-10 DIAGNOSIS — S8991XA Unspecified injury of right lower leg, initial encounter: Secondary | ICD-10-CM | POA: Diagnosis not present

## 2016-03-10 NOTE — Progress Notes (Signed)
PCP and consultation requested by: Willow OraJose Paz, MD  Subjective:   HPI: Patient is a 41 y.o. female here for right knee pain.  Patient reports at the end of January she slipped on a wet surface and fell forward directly onto her right knee. Initially pain was just anterior but now feeling posteriorly as well. Worse with standing a long time. Pain level 4/10 and sharp. Not taking any medicines or icing. Has been using an ACE wrap. No prior injury to right knee. No skin changes, numbness, swelling.  Past Medical History:  Diagnosis Date  . Alopecia   . Anal fistula   . Anxiety and depression 03/07/2014  . GERD (gastroesophageal reflux disease) 02/14/2014  . Goiter 03/07/2014  . Headache(784.0)   . Hemorrhoids   . Kidney stone   . Pseudotumor cerebri     Current Outpatient Prescriptions on File Prior to Visit  Medication Sig Dispense Refill  . ALPRAZolam (XANAX) 0.5 MG tablet Take 1 tablet (0.5 mg total) by mouth 4 (four) times daily as needed for anxiety. 40 tablet 0  . celecoxib (CELEBREX) 200 MG capsule Take 1 capsule (200 mg total) by mouth daily as needed. 30 capsule 1  . citalopram (CELEXA) 20 MG tablet Take 2 tablets (40 mg total) by mouth daily. 60 tablet 5  . pantoprazole (PROTONIX) 40 MG tablet Take 1 tablet (40 mg total) by mouth daily. 30 tablet 5   No current facility-administered medications on file prior to visit.     Past Surgical History:  Procedure Laterality Date  . CHOLECYSTECTOMY  2004    No Known Allergies  Social History   Social History  . Marital status: Married    Spouse name: N/A  . Number of children: 1  . Years of education: N/A   Occupational History  . stay home    Social History Main Topics  . Smoking status: Never Smoker  . Smokeless tobacco: Never Used  . Alcohol use No  . Drug use: No  . Sexual activity: Not Currently    Birth control/ protection: None   Other Topics Concern  . 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 Diabetes Mother   . Hypertension Mother   . Esophageal cancer Maternal Grandmother   . Esophageal cancer Maternal Grandfather   . Diabetes Maternal Grandfather   . Colon cancer Neg Hx   . Colon polyps Neg Hx   . Gallbladder disease Neg Hx   . Kidney disease Neg Hx     BP 117/75   Pulse 75   Ht 5\' 6"  (1.676 m)   Wt 267 lb (121.1 kg)   LMP 03/02/2016   BMI 43.09 kg/m   Review of Systems: See HPI above.     Objective:  Physical Exam:  Gen: NAD, comfortable in exam room  Right knee: No gross deformity, ecchymoses, effusion. TTP medial hamstring and gastroc.  No other tenderness including joint lines, post patellar facets. FROM. Negative ant/post drawers. Negative valgus/varus testing. Negative lachmanns. Negative mcmurrays, apleys, patellar apprehension. NV intact distally.  Left knee: FROM without pain.   Assessment & Plan:  1. Right knee injury - initially due to knee contusion. Now primarily with calf and hamstring overuse strains.  Compression sleeve, ibuprofen or aleve.  Shown home exercises and stretches to do daily.  Declined PT for now (would only be allowed one visit).  Heat for spasms.  Activities as tolerated.  F/u in 1 month.

## 2016-03-10 NOTE — Assessment & Plan Note (Signed)
initially due to knee contusion. Now primarily with calf and hamstring overuse strains.  Compression sleeve, ibuprofen or aleve.  Shown home exercises and stretches to do daily.  Declined PT for now (would only be allowed one visit).  Heat for spasms.  Activities as tolerated.  F/u in 1 month.

## 2016-03-10 NOTE — Patient Instructions (Signed)
Your initial injury was a severe knee contusion - now you have secondary calf and hamstring strains from overuse. Compression sleeve (or ACE wrap) when up and walking around. Ibuprofen  three times a day with food OR aleve 2 tabs twice a day with food for pain and inflammation as needed. Hamstring curls, swings, lunges, calf raises 3 sets of 10 once a day - add ankle weight if the swings and curls become too easy. Consider physical therapy though you would only be allowed to do one visit. Heat 15 minutes at a time 3-4 times a day. Try to stay as active as possible. Follow up with me in 1 month.

## 2016-04-07 ENCOUNTER — Ambulatory Visit: Payer: Medicaid Other | Admitting: Family Medicine

## 2016-05-13 ENCOUNTER — Encounter: Payer: Self-pay | Admitting: Internal Medicine

## 2016-05-13 ENCOUNTER — Ambulatory Visit (INDEPENDENT_AMBULATORY_CARE_PROVIDER_SITE_OTHER): Payer: Medicaid Other | Admitting: Internal Medicine

## 2016-05-13 VITALS — BP 118/78 | HR 87 | Temp 98.7°F | Resp 14 | Ht 66.0 in | Wt 276.0 lb

## 2016-05-13 DIAGNOSIS — G245 Blepharospasm: Secondary | ICD-10-CM

## 2016-05-13 DIAGNOSIS — F419 Anxiety disorder, unspecified: Secondary | ICD-10-CM | POA: Insufficient documentation

## 2016-05-13 DIAGNOSIS — S322XXD Fracture of coccyx, subsequent encounter for fracture with routine healing: Secondary | ICD-10-CM | POA: Diagnosis not present

## 2016-05-13 MED ORDER — KETOCONAZOLE 2 % EX SHAM
1.0000 | MEDICATED_SHAMPOO | CUTANEOUS | 3 refills | Status: DC
Start: 2016-05-13 — End: 2016-05-18

## 2016-05-13 MED ORDER — PANTOPRAZOLE SODIUM 40 MG PO TBEC: 40.0000 mg | DELAYED_RELEASE_TABLET | Freq: Every day | ORAL | 1 refills | Status: DC

## 2016-05-13 MED ORDER — PROPRANOLOL HCL 20 MG PO TABS: 20.0000 mg | ORAL_TABLET | Freq: Every day | ORAL | 0 refills | Status: DC | PRN

## 2016-05-13 MED ORDER — CITALOPRAM HYDROBROMIDE 20 MG PO TABS: 40.0000 mg | ORAL_TABLET | Freq: Every day | ORAL | 1 refills | Status: DC

## 2016-05-13 NOTE — Assessment & Plan Note (Signed)
PLAN: Right knee pain: Still hurting, plans to see sports medicine in few days Coccyx fracture: Pain improved, okay to take a prolonged airplane trip, definitely use a doughnut pillow consistently and take frequent walks in the airplane. Dandruff: Nizoral shampoo twice a week. Blepharospasm: Sxs as described above likely blepharospasm. Recommend observation Anxiety: Propranolol 20 mg as needed works better than Xanax. Refill provided. Stop Xanax. RTC 6 months

## 2016-05-13 NOTE — Progress Notes (Signed)
Pre visit review using our clinic review tool, if applicable. No additional management support is needed unless otherwise documented below in the visit note. 

## 2016-05-13 NOTE — Patient Instructions (Signed)
Use the Nizoral shampoo twice a week for the next 2 months to help dandruff  Use the doughnut pillow consistently

## 2016-05-13 NOTE — Progress Notes (Signed)
Subjective:    Patient ID: Stephanie Davies, female    DOB: Oct 27, 1975, 41 y.o.   MRN: [id number]  DOS:  05/13/2016 Type of visit - description : acute Interval history: [place] going to [place] almost a 24-hour airplane trip, wonder if this is okay given recent coccyx fracture. Reports that the back pain is much improved. Still has knee pain. Also having dandruff, all OTCs failed. Also, for anxiety, she was recommended propranolol 20 mg as needed and that works better than Xanax, denies having any weakness, dizziness when she takes it. Request a prescription. Also, sometimes the left lower eyelid "shivers", no other symptoms such as slurred speech, motor deficits.   Review of Systems   Past Medical History:  Diagnosis Date  . Alopecia   . Anal fistula   . Anxiety and depression 03/07/2014  . GERD (gastroesophageal reflux disease) 02/14/2014  . Goiter 03/07/2014  . Headache(784.0)   . Hemorrhoids   . Kidney stone   . Pseudotumor cerebri     Past Surgical History:  Procedure Laterality Date  . CHOLECYSTECTOMY  2004    Social History   Social History  . Marital status: Married    Spouse name: N/A  . Number of children: 1  . Years of education: N/A   Occupational History  . stay home    Social History Main Topics  . Smoking status: Never Smoker  . Smokeless tobacco: Never Used  . Alcohol use No  . Drug use: No  . Sexual activity: Not Currently    Birth control/ protection: None   Other Topics Concern  . 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 Always use your most recent med list.          celecoxib 200 MG capsule Commonly known as:  CELEBREX Take 1 capsule (200 mg total) by mouth daily as needed.   citalopram 20 MG tablet Commonly known as:   CELEXA Take 2 tablets (40 mg total) by mouth daily.   ketoconazole 2 % shampoo Commonly known as:  NIZORAL Apply 1 application topically 2 (two) times a week.   pantoprazole 40 MG tablet Commonly known as:  PROTONIX Take 1 tablet (40 mg total) by mouth daily.   propranolol 20 MG tablet Commonly known as:  INDERAL Take 1 tablet (20 mg total) by mouth daily as needed (anxiety).          Objective:   Physical Exam BP 118/78 (BP Location: Left Arm, Patient Position: Sitting, Cuff Size: Normal)   Pulse 87   Temp 98.7 F (37.1 C) (Oral)   Resp 14   Ht 5\' 6"  (1.676 m)   Wt 276 lb (125.2 kg)   LMP 04/23/2016 (Exact Date)   SpO2 98%   BMI 44.55 kg/m  General:   Well developed, well nourished . NAD.  HEENT:  Normocephalic . Face symmetric, atraumatic  Skin: Scalp looks healthy, no dandruff (patient reports she just took a shower and put some oil) Neurologic:  alert & oriented X3.  Speech normal, gait appropriate for age and unassisted No involuntary movements noted in the face Psych--  Cognition and judgment appear intact.  Cooperative with normal attention span and  concentration.  Behavior appropriate. No anxious or depressed appearing.      Assessment & Plan:   Assessment Anxiety, depression Several tumor cerebri, headaches Goiter : [place] 03-2014, bx  Done 11-2015   PLAN: Right knee pain: Still hurting, plans to see sports medicine in few days Coccyx fracture: Pain improved, okay to take a prolonged airplane trip, definitely use a doughnut pillow consistently and take frequent walks in the airplane. Dandruff: Nizoral shampoo twice a week. Blepharospasm: Sxs as described above likely blepharospasm. Recommend observation Anxiety: Propranolol 20 mg as needed works better than Xanax. Refill provided. Stop Xanax. RTC 6 months

## 2016-05-17 ENCOUNTER — Ambulatory Visit (INDEPENDENT_AMBULATORY_CARE_PROVIDER_SITE_OTHER): Payer: Medicaid Other | Admitting: Family Medicine

## 2016-05-17 ENCOUNTER — Encounter: Payer: Self-pay | Admitting: Family Medicine

## 2016-05-17 DIAGNOSIS — S8991XD Unspecified injury of right lower leg, subsequent encounter: Secondary | ICD-10-CM

## 2016-05-17 NOTE — Patient Instructions (Signed)
We will go ahead with an MRI of your knee at this point to assess for a medial meniscus tear. I will call you the business day following the MRI to go over results and next steps.

## 2016-05-18 ENCOUNTER — Telehealth: Payer: Self-pay | Admitting: Internal Medicine

## 2016-05-18 MED ORDER — KETOCONAZOLE 2 % EX SHAM: 1.0000 "application " | MEDICATED_SHAMPOO | CUTANEOUS | 0 refills | Status: DC

## 2016-05-18 NOTE — Telephone Encounter (Signed)
Caller name: Relationship to patient: Self Can be reached: [phone]  Pharmacy:  [facility] [address] - [place], [place] - [address] [phone] (Phone) [phone] (Fax)    Reason for call: Pharmacy informed patient to ask provider to write Rx for 3 bottles ketoconazole (NIZORAL) 2 % shampoo [[id number] because her insurance is only going to cover 1 month for this shampoo.

## 2016-05-18 NOTE — Telephone Encounter (Signed)
Can only order Rx in mL's. sent.

## 2016-05-19 NOTE — Assessment & Plan Note (Signed)
Concerning she continues to struggle with pain right knee despite conservative treatment over several months.  Discussed options - will go ahead with MRI to further assess for possible meniscus tear in addition to contusion, secondary calf/hamstring strains.  Compression sleeve, ibuprofen or aleve.

## 2016-05-19 NOTE — Progress Notes (Addendum)
PCP and consultation requested by: Wanda Plump, MD  Subjective:   HPI: Patient is a 41 y.o. female here for right knee pain.  2/28: Patient reports at the end of January she slipped on a wet surface and fell forward directly onto her right knee. Initially pain was just anterior but now feeling posteriorly as well. Worse with standing a long time. Pain level 4/10 and sharp. Not taking any medicines or icing. Has been using an ACE wrap. No prior injury to right knee. No skin changes, numbness, swelling.  5/7: Patient reports she continues to struggle with right knee pain. Feels like knee wants to pop anteriorly. No buckling or catching but does lock where she can't bend knee anymore due to pain. Pain is 6/10, sharp. Not taking anything for this currently. Doing home exercises. No skin changes, numbness.  Past Medical History:  Diagnosis Date  . Alopecia   . Anal fistula   . Anxiety and depression 03/07/2014  . GERD (gastroesophageal reflux disease) 02/14/2014  . Goiter 03/07/2014  . Headache(784.0)   . Hemorrhoids   . Kidney stone   . Pseudotumor cerebri     Current Outpatient Prescriptions on File Prior to Visit  Medication Sig Dispense Refill  . celecoxib (CELEBREX) 200 MG capsule Take 1 capsule (200 mg total) by mouth daily as needed. 30 capsule 1  . citalopram (CELEXA) 20 MG tablet Take 2 tablets (40 mg total) by mouth daily. 180 tablet 1  . pantoprazole (PROTONIX) 40 MG tablet Take 1 tablet (40 mg total) by mouth daily. 90 tablet 1  . propranolol (INDERAL) 20 MG tablet Take 1 tablet (20 mg total) by mouth daily as needed (anxiety). 90 tablet 0   No current facility-administered medications on file prior to visit.     Past Surgical History:  Procedure Laterality Date  . CHOLECYSTECTOMY  2004    No Known Allergies  Social History   Social History  . Marital status: Married    Spouse name: N/A  . Number of children: 1  . Years of education: N/A    Occupational History  . stay home    Social History Main Topics  . Smoking status: Never Smoker  . Smokeless tobacco: Never Used  . Alcohol use No  . Drug use: No  . Sexual activity: Not Currently    Birth control/ protection: None   Other Topics Concern  . 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 Diabetes Mother   . Hypertension Mother   . Esophageal cancer Maternal Grandmother   . Esophageal cancer Maternal Grandfather   . Diabetes Maternal Grandfather   . Colon cancer Neg Hx   . Colon polyps Neg Hx   . Gallbladder disease Neg Hx   . Kidney disease Neg Hx     BP 104/70   Pulse 75   Ht 5\' 6"  (1.676 m)   Wt 270 lb (122.5 kg)   LMP 04/23/2016 (Exact Date)   BMI 43.58 kg/m   Review of Systems: See HPI above.     Objective:  Physical Exam:  Gen: NAD, comfortable in exam room  Right knee: No gross deformity, ecchymoses, effusion. TTP medial joint line, hamstring and gastroc.  No other tenderness including lateral joint line, post patellar facets. FROM. Negative ant/post drawers. Negative valgus/varus testing.  Negative lachmanns. Mild pain mcmurrays, apleys, negative patellar apprehension. NV intact distally.  Left knee: FROM without pain.   Assessment & Plan:  1. Right knee injury - Concerning she continues to struggle with pain right knee despite conservative treatment over several months.  Discussed options - will go ahead with MRI to further assess for possible meniscus tear in addition to contusion, secondary calf/hamstring strains.  Compression sleeve, ibuprofen or aleve.    Addendum:  MRI reviewed and discussed with patient.  She does have a medial meniscus tear with minimal underlying degenerative changes.  We discussed options - she is leaving the country for about 3 months in a week - she would like to  try therapy and injection, consider arthroscopy when she returns.

## 2016-05-24 NOTE — Addendum Note (Signed)
Addended by: Kathi SimpersWISE, Geordie Nooney F on: 05/24/2016 08:47 AM   Modules accepted: Orders

## 2016-05-29 ENCOUNTER — Ambulatory Visit (HOSPITAL_BASED_OUTPATIENT_CLINIC_OR_DEPARTMENT_OTHER)
Admission: RE | Admit: 2016-05-29 | Discharge: 2016-05-29 | Disposition: A | Discharge status: Home / Self Care | Payer: Medicaid Other | Source: Ambulatory Visit | Attending: Family Medicine | Admitting: Family Medicine

## 2016-05-29 DIAGNOSIS — S83241A Other tear of medial meniscus, current injury, right knee, initial encounter: Secondary | ICD-10-CM | POA: Diagnosis not present

## 2016-05-29 DIAGNOSIS — X58XXXA Exposure to other specified factors, initial encounter: Secondary | ICD-10-CM | POA: Insufficient documentation

## 2016-05-29 DIAGNOSIS — S8991XD Unspecified injury of right lower leg, subsequent encounter: Secondary | ICD-10-CM | POA: Diagnosis present

## 2016-06-08 ENCOUNTER — Ambulatory Visit: Payer: Medicaid Other | Admitting: Family Medicine

## 2016-07-19 ENCOUNTER — Ambulatory Visit (INDEPENDENT_AMBULATORY_CARE_PROVIDER_SITE_OTHER): Payer: Medicaid Other | Admitting: Internal Medicine

## 2016-07-19 ENCOUNTER — Encounter: Payer: Self-pay | Admitting: Internal Medicine

## 2016-07-19 VITALS — BP 132/80 | HR 80 | Temp 97.8°F | Resp 14 | Ht 66.0 in | Wt 272.2 lb

## 2016-07-19 DIAGNOSIS — F419 Anxiety disorder, unspecified: Secondary | ICD-10-CM

## 2016-07-19 DIAGNOSIS — M722 Plantar fascial fibromatosis: Secondary | ICD-10-CM | POA: Diagnosis not present

## 2016-07-19 DIAGNOSIS — R202 Paresthesia of skin: Secondary | ICD-10-CM | POA: Diagnosis not present

## 2016-07-19 LAB — COMPREHENSIVE METABOLIC PANEL
ALT: 18 U/L (ref 0–35)
AST: 12 U/L (ref 0–37)
Albumin: 3.8 g/dL (ref 3.5–5.2)
Alkaline Phosphatase: 90 U/L (ref 39–117)
BUN: 8 mg/dL (ref 6–23)
CO2: 26 mEq/L (ref 19–32)
Calcium: 9.7 mg/dL (ref 8.4–10.5)
Chloride: 107 mEq/L (ref 96–112)
Creatinine, Ser: 0.73 mg/dL (ref 0.40–1.20)
GFR: 94.44 mL/min (ref >60.00)
Glucose, Bld: 102 mg/dL — ABNORMAL HIGH (ref 70–99)
Potassium: 4.4 mEq/L (ref 3.5–5.1)
Sodium: 140 mEq/L (ref 135–145)
Total Bilirubin: 0.3 mg/dL (ref 0.2–1.2)
Total Protein: 7 g/dL (ref 6.0–8.3)

## 2016-07-19 LAB — CBC WITH DIFFERENTIAL/PLATELET
Basophils Absolute: 0 10*3/uL (ref 0.0–0.1)
Basophils Relative: 0.5 % (ref 0.0–3.0)
Eosinophils Absolute: 0.1 10*3/uL (ref 0.0–0.7)
Eosinophils Relative: 1.5 % (ref 0.0–5.0)
HCT: 38.9 % (ref 36.0–46.0)
Hemoglobin: 12.7 g/dL (ref 12.0–15.0)
Lymphocytes Relative: 21.9 % (ref 12.0–46.0)
Lymphs Abs: 1.9 10*3/uL (ref 0.7–4.0)
MCHC: 32.7 g/dL (ref 30.0–36.0)
MCV: 79.3 fl (ref 78.0–100.0)
Monocytes Absolute: 0.4 10*3/uL (ref 0.1–1.0)
Monocytes Relative: 4.8 % (ref 3.0–12.0)
Neutro Abs: 6.3 10*3/uL (ref 1.4–7.7)
Neutrophils Relative %: 71.3 % (ref 43.0–77.0)
Platelets: 401 10*3/uL — ABNORMAL HIGH (ref 150.0–400.0)
RBC: 4.9 Mil/uL (ref 3.87–5.11)
RDW: 16.2 % — ABNORMAL HIGH (ref 11.5–15.5)
WBC: 8.8 10*3/uL (ref 4.0–10.5)

## 2016-07-19 LAB — TSH: TSH: 1.78 u[IU]/mL (ref 0.35–4.50)

## 2016-07-19 LAB — HEMOGLOBIN A1C: Hgb A1c MFr Bld: 5.6 % (ref 4.6–6.5)

## 2016-07-19 LAB — FOLATE: Folate: 10.1 ng/mL (ref >5.9)

## 2016-07-19 LAB — VITAMIN B12: Vitamin B-12: 219 pg/mL (ref 211–911)

## 2016-07-19 LAB — SEDIMENTATION RATE: Sed Rate: 12 mm/hr (ref 0–20)

## 2016-07-19 MED ORDER — HYDROXYZINE HCL 10 MG PO TABS: 10.0000 mg | ORAL_TABLET | Freq: Two times a day (BID) | ORAL | 0 refills | Status: DC | PRN

## 2016-07-19 MED ORDER — CELECOXIB 200 MG PO CAPS: 200.0000 mg | ORAL_CAPSULE | Freq: Every day | ORAL | 1 refills | Status: DC | PRN

## 2016-07-19 NOTE — Progress Notes (Signed)
Pre visit review using our clinic review tool, if applicable. No additional management support is needed unless otherwise documented below in the visit note. 

## 2016-07-19 NOTE — Progress Notes (Signed)
Subjective:    Patient ID: Stephanie Davies, female    DOB: 1975-04-24, 41 y.o.   MRN: [id number]  DOS:  07/19/2016 Type of visit - description : acute  Interval history: For the last 2 weeks she is having a discomfort described as hot and burning at the bottom of the feet. Also, seems to have actual pain at the bottom of the heels, "like needles" worse when she walks, worse right after she wakes up.   Review of Systems Denies lower extremity edema, rash or swelling. Propranolol not working as well as before. Change to another agent?  Past Medical History:  Diagnosis Date  . Alopecia   . Anal fistula   . Anxiety and depression 03/07/2014  . GERD (gastroesophageal reflux disease) 02/14/2014  . Goiter 03/07/2014  . Headache(784.0)   . Hemorrhoids   . Kidney stone   . Pseudotumor cerebri     Past Surgical History:  Procedure Laterality Date  . CHOLECYSTECTOMY  2004    Social History   Social History  . Marital status: Married    Spouse name: N/A  . Number of children: 1  . Years of education: N/A   Occupational History  . stay home    Social History Main Topics  . Smoking status: Never Smoker  . Smokeless tobacco: Never Used  . Alcohol use No  . Drug use: No  . Sexual activity: Not Currently    Birth control/ protection: None   Other Topics Concern  . 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 Always use your most recent med list.          celecoxib 200 MG capsule Commonly known as:  CELEBREX Take 1 capsule (200 mg total) by mouth daily as needed.   citalopram 20 MG tablet Commonly known as:  CELEXA Take 2 tablets (40 mg total) by mouth daily.   hydrOXYzine 10 MG tablet Commonly known as:  ATARAX/VISTARIL Take 1 tablet (10 mg total) by mouth 2  (two) times daily as needed (anxiety).   pantoprazole 40 MG tablet Commonly known as:  PROTONIX Take 1 tablet (40 mg total) by mouth daily.          Objective:   Physical Exam BP 132/80 (BP Location: Left Arm, Patient Position: Sitting, Cuff Size: Normal)   Pulse 80   Temp 97.8 F (36.6 C) (Oral)   Resp 14   Ht 5\' 6"  (1.676 m)   Wt 272 lb 4 oz (123.5 kg)   LMP 07/15/2016 (Exact Date)   SpO2 98%   BMI 43.94 kg/m  General:   Well developed, well nourished . NAD.  HEENT:  Normocephalic . Face symmetric, atraumatic Lower extremities: No lower extremity edema, pedal pulses normal. Ankle feet normal to inspection and palpation. MSK: Mildly TTP at the lower back, not a new finding according to the patient. + TTP at the plantar aspect of the right heel. Skin: Not pale. Not jaundice Neurologic:  alert & oriented X3.  Speech normal, gait appropriate for age and unassisted. DTRs symmetric. Pinprick examination lower extremities normal. Psych--  Cognition and judgment appear intact.  Cooperative with normal attention span and concentration.  Behavior appropriate. No anxious  or depressed appearing.      Assessment & Plan:   Assessment Anxiety, depression Several tumor cerebri, headaches Goiter : [place] 03-2014, bx  Done 11-2015   PLAN: Paresthesias: Etiology unclear, neurological exam unremarkable. Will get labs, further advise with results. RTC 2 or 3 weeks.  Labs: CMP, CBC, A1c, TSH, B12, folic acid, sedimentation rate, ANA, RF Planta fasciitis: She seems to have an additional problem different from paresthesias, pain of the heels likely planta fasciitis. Stretching and information provided. Anxiety: Panic attacks used to be better control with propranolol, would like to change agent. Switch to Atarax as needed. Watch excessive somnolence RTC 3 weeks

## 2016-07-19 NOTE — Patient Instructions (Signed)
GO TO THE LAB : Get the blood work     GO TO THE FRONT DESK Schedule your next appointment for a  checkup in 3 weeks  Stretching for the fasciitis     Plantar Fasciitis Plantar fasciitis is a painful foot condition that affects the heel. It occurs when the band of tissue that connects the toes to the heel bone (plantar fascia) becomes irritated. This can happen after exercising too much or doing other repetitive activities (overuse injury). The pain from plantar fasciitis can range from mild irritation to severe pain that makes it difficult for you to walk or move. The pain is usually worse in the morning or after you have been sitting or lying down for a while. What are the causes? This condition may be caused by:  Standing for long periods of time.  Wearing shoes that do not fit.  Doing high-impact activities, including running, aerobics, and ballet.  Being overweight.  Having an abnormal way of walking (gait).  Having tight calf muscles.  Having high arches in your feet.  Starting a new athletic activity.  What are the signs or symptoms? The main symptom of this condition is heel pain. Other symptoms include:  Pain that gets worse after activity or exercise.  Pain that is worse in the morning or after resting.  Pain that goes away after you walk for a few minutes.  How is this diagnosed? This condition may be diagnosed based on your signs and symptoms. Your health care provider will also do a physical exam to check for:  A tender area on the bottom of your foot.  A high arch in your foot.  Pain when you move your foot.  Difficulty moving your foot.  You may also need to have imaging studies to confirm the diagnosis. These can include:  X-rays.  Ultrasound.  MRI.  How is this treated? Treatment for plantar fasciitis depends on the severity of the condition. Your treatment may include:  Rest, ice, and over-the-counter pain medicines to manage your  pain.  Exercises to stretch your calves and your plantar fascia.  A splint that holds your foot in a stretched, upward position while you sleep (night splint).  Physical therapy to relieve symptoms and prevent problems in the future.  Cortisone injections to relieve severe pain.  Extracorporeal shock wave therapy (ESWT) to stimulate damaged plantar fascia with electrical impulses. It is often used as a last resort before surgery.  Surgery, if other treatments have not worked after 12 months.  Follow these instructions at home:  Take medicines only as directed by your health care provider.  Avoid activities that cause pain.  Roll the bottom of your foot over a bag of ice or a bottle of cold water. Do this for 20 minutes, 3-4 times a day.  Perform simple stretches as directed by your health care provider.  Try wearing athletic shoes with air-sole or gel-sole cushions or soft shoe inserts.  Wear a night splint while sleeping, if directed by your health care provider.  Keep all follow-up appointments with your health care provider. How is this prevented?  Do not perform exercises or activities that cause heel pain.  Consider finding low-impact activities if you continue to have problems.  Lose weight if you need to. The best way to prevent plantar fasciitis is to avoid the activities that aggravate your plantar fascia. Contact a health care provider if:  Your symptoms do not go away after treatment with home care  measures.  Your pain gets worse.  Your pain affects your ability to move or do your daily activities. This information is not intended to replace advice given to you by your health care provider. Make sure you discuss any questions you have with your health care provider. Document Released: 09/22/2000 Document Revised: 06/02/2015 Document Reviewed: 11/07/2013 Elsevier Interactive Patient Education  [employer].

## 2016-07-20 LAB — RHEUMATOID FACTOR: Rhuematoid fact SerPl-aCnc: <14 IU/mL (ref <14)

## 2016-07-20 LAB — ANA: Anti Nuclear Antibody(ANA): NEGATIVE

## 2016-07-20 NOTE — Assessment & Plan Note (Signed)
Paresthesias: Etiology unclear, neurological exam unremarkable. Will get labs, further advise with results. RTC 2 or 3 weeks.  Labs: CMP, CBC, A1c, TSH, B12, folic acid, sedimentation rate, ANA, RF Planta fasciitis: She seems to have an additional problem different from paresthesias, pain of the heels likely planta fasciitis. Stretching and information provided. Anxiety: Panic attacks used to be better control with propranolol, would like to change agent. Switch to Atarax as needed. Watch excessive somnolence RTC 3 weeks

## 2016-07-22 NOTE — Addendum Note (Signed)
Addended byConrad Kerkhoven: Daphene Chisholm D on: 07/22/2016 03:18 PM   Modules accepted: Orders

## 2016-08-02 ENCOUNTER — Encounter: Payer: Self-pay | Admitting: Internal Medicine

## 2016-08-02 ENCOUNTER — Ambulatory Visit (INDEPENDENT_AMBULATORY_CARE_PROVIDER_SITE_OTHER): Payer: Medicaid Other | Admitting: Internal Medicine

## 2016-08-02 ENCOUNTER — Ambulatory Visit (INDEPENDENT_AMBULATORY_CARE_PROVIDER_SITE_OTHER): Payer: Medicaid Other | Admitting: Family Medicine

## 2016-08-02 ENCOUNTER — Encounter: Payer: Self-pay | Admitting: Family Medicine

## 2016-08-02 VITALS — BP 115/62 | HR 82 | Temp 98.4°F | Ht 66.0 in | Wt 275.1 lb

## 2016-08-02 DIAGNOSIS — M25561 Pain in right knee: Secondary | ICD-10-CM

## 2016-08-02 DIAGNOSIS — R202 Paresthesia of skin: Secondary | ICD-10-CM | POA: Diagnosis not present

## 2016-08-02 DIAGNOSIS — S8991XD Unspecified injury of right lower leg, subsequent encounter: Secondary | ICD-10-CM | POA: Diagnosis not present

## 2016-08-02 DIAGNOSIS — M722 Plantar fascial fibromatosis: Secondary | ICD-10-CM

## 2016-08-02 DIAGNOSIS — F419 Anxiety disorder, unspecified: Secondary | ICD-10-CM | POA: Diagnosis not present

## 2016-08-02 MED ORDER — PANTOPRAZOLE SODIUM 40 MG PO TBEC: 40.0000 mg | DELAYED_RELEASE_TABLET | Freq: Every day | ORAL | 0 refills | Status: DC

## 2016-08-02 MED ORDER — CITALOPRAM HYDROBROMIDE 20 MG PO TABS: 40.0000 mg | ORAL_TABLET | Freq: Every day | ORAL | 0 refills | Status: DC

## 2016-08-02 MED ORDER — METHYLPREDNISOLONE ACETATE 40 MG/ML IJ SUSP
40.0000 mg | Freq: Once | INTRAMUSCULAR | Status: AC
  Administered 2016-08-02: 40 mg via INTRA_ARTICULAR

## 2016-08-02 MED ORDER — HYDROXYZINE HCL 10 MG PO TABS: 10.0000 mg | ORAL_TABLET | Freq: Two times a day (BID) | ORAL | 0 refills | Status: DC | PRN

## 2016-08-02 NOTE — Progress Notes (Signed)
Subjective:    Patient ID: Stephanie Davies, female    DOB: [birth year], 41 y.o.   MRN: [id number]  DOS:  08/02/2016 Type of visit - description : f/u Interval history: Paresthesias: Unchanged Plantar fasciitis: About the same, reports that stretching has not help. Taking Celebrex, not helping much. Needs refill medications, she is going  to [place] for [place]8 months. Anxiety- Takes Atarax 2 tablets as needed, works well without side effects.   Review of Systems  Denies neck or back pain per se. No rash at the lower extremities  Past Medical History:  Diagnosis Date  . Alopecia   . Anal fistula   . Anxiety and depression 03/07/2014  . GERD (gastroesophageal reflux disease) 02/14/2014  . Goiter 03/07/2014  . Headache(784.0)   . Hemorrhoids   . Kidney stone   . Pseudotumor cerebri     Past Surgical History:  Procedure Laterality Date  . CHOLECYSTECTOMY  2004    Social History   Social History  . Marital status: Married    Spouse name: N/A  . Number of children: 1  . Years of education: N/A   Occupational History  . stay home    Social History Main Topics  . Smoking status: Never Smoker  . Smokeless tobacco: Never Used  . Alcohol use No  . Drug use: No  . Sexual activity: Not Currently    Birth control/ protection: None   Other Topics Concern  . 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 Always use your most recent med list.          celecoxib 200 MG capsule Commonly known as:  CELEBREX Take 1 capsule (200 mg total) by mouth daily as needed.   citalopram 20 MG tablet Commonly known as:  CELEXA Take 2 tablets (40 mg total) by mouth daily.   hydrOXYzine 10 MG tablet Commonly known as:  ATARAX/VISTARIL Take 1 tablet (10 mg total) by mouth 2 (two)  times daily as needed (anxiety).   pantoprazole 40 MG tablet Commonly known as:  PROTONIX Take 1 tablet (40 mg total) by mouth daily.          Objective:   Physical Exam BP 115/62 (BP Location: Left Arm, Patient Position: Sitting, Cuff Size: Normal)   Pulse 82   Temp 98.4 F (36.9 C) (Oral)   Ht 5\' 6"  (1.676 m)   Wt 275 lb 2 oz (124.8 kg)   LMP 07/15/2016 (Exact Date)   SpO2 99%   BMI 44.41 kg/m  General:   Well developed, well nourished . NAD.  HEENT:  Normocephalic . Face symmetric, atraumatic  MSK: Slightly TTP at the low back, chronic finding per patient. Mildly TTP at the plantar aspect of the heel, worse on the left Skin: Not pale. Not jaundice Neurologic:  alert & oriented X3.  Speech normal, gait appropriate for age and unassisted DTRs symmetric, pinprick examination lower extremities normal. Psych--  Cognition and judgment appear intact.  Cooperative with normal attention span and concentration.  Behavior appropriate. No anxious or depressed appearing.      Assessment & Plan:    Assessment Anxiety, depression Several tumor cerebri, headaches Goiter : [place] 03-2014, bx  Done 11-2015   PLAN: Paresthesias : labs wnl, sx unchanged, neuro eval pending, she is leaving to [place] next week ( x 8 months) thus rec to see a neurologist over there. Plantar fasciitis : Not improving, recommend to cont stretching, use a heel insert and will refer to podiatry, hopefully she will be able to see them before she leaves. Anxiety : Atarax help,  takes 2 tablets as needed. Does not get excessively sleepy. Refill provided. RTC once she is back in the [place]

## 2016-08-02 NOTE — Patient Instructions (Addendum)
Continue stretching twice a day  Get a shoe insert for plantar fasciitis.(Heel insert). No flat shoes   We are referring you to a foot doctor  Stop Celebrex  If you get pregnant, stop all medications and consult by doctor before you continue taking meds

## 2016-08-02 NOTE — Patient Instructions (Addendum)
You have a medial meniscus tear. I would recommend doing physical therapy and home exercises when you are abroad, follow up with me when you return. These are the different medications you can take for this: Tylenol 500mg  1-2 tabs three times a day for pain. Aleve 1-2 tabs twice a day with food Capsaicin, aspercreme, or biofreeze topically up to four times a day may also help with pain. You were given a cortisone shot today.

## 2016-08-02 NOTE — Progress Notes (Signed)
Pre visit review using our clinic review tool, if applicable. No additional management support is needed unless otherwise documented below in the visit note. 

## 2016-08-02 NOTE — Assessment & Plan Note (Signed)
Paresthesias : labs wnl, sx unchanged, neuro eval pending, she is leaving to [place] next week ( x 8 months) thus rec to see a neurologist over there. Plantar fasciitis : Not improving, recommend to cont stretching, use a heel insert and will refer to podiatry, hopefully she will be able to see them before she leaves. Anxiety : Atarax help,  takes 2 tablets as needed. Does not get excessively sleepy. Refill provided. RTC once she is back in the [place]

## 2016-08-03 ENCOUNTER — Encounter: Payer: Self-pay | Admitting: Podiatry

## 2016-08-03 ENCOUNTER — Ambulatory Visit (INDEPENDENT_AMBULATORY_CARE_PROVIDER_SITE_OTHER): Payer: Medicaid Other | Admitting: Podiatry

## 2016-08-03 ENCOUNTER — Ambulatory Visit (HOSPITAL_BASED_OUTPATIENT_CLINIC_OR_DEPARTMENT_OTHER)
Admission: RE | Admit: 2016-08-03 | Discharge: 2016-08-03 | Disposition: A | Discharge status: Home / Self Care | Payer: Medicaid Other | Source: Ambulatory Visit | Attending: Podiatry | Admitting: Podiatry

## 2016-08-03 DIAGNOSIS — M722 Plantar fascial fibromatosis: Secondary | ICD-10-CM | POA: Diagnosis not present

## 2016-08-03 DIAGNOSIS — M792 Neuralgia and neuritis, unspecified: Secondary | ICD-10-CM | POA: Diagnosis not present

## 2016-08-03 MED ORDER — CELECOXIB 200 MG PO CAPS: 200.0000 mg | ORAL_CAPSULE | Freq: Every day | ORAL | 0 refills | Status: DC

## 2016-08-03 MED ORDER — METHYLPREDNISOLONE 4 MG PO TBPK: ORAL_TABLET | ORAL | 0 refills | Status: DC

## 2016-08-03 NOTE — Progress Notes (Signed)
Subjective:    Patient ID: Stephanie Davies, female    DOB: Oct 26, 1975, [age] y.o.   MRN: [id number]  HPI  Stephanie Davies Presents the office today for concerns of bilateral heel pain. She states that this has been ongoing for greater than one month. She states that she was on Celebrex was prescribed by Dr. Drue NovelPaz. She states this does help her symptoms do continue. She gets pain in the bottom of her heels she says he gets pain when she first gets up and during the day after doing a lot of walking. She also does relate it feels like a hot, deep image to her feet and she describes burning sensation in both of her feet is on the bottom. She states this makes it difficult for her to wear shoes. She denies any recent injury or trauma and she denies any claudication symptoms. She hasn't stretching which does help some. She has no other concerns today.   Review of Systems  All other systems reviewed and are negative.      Objective:   Physical Exam General: AAO x3, NAD  Dermatological: Skin is warm, dry and supple bilateral. Nails x 10 are well manicured; remaining integument appears unremarkable at this time. There are no open sores, no preulcerative lesions, no rash or signs of infection present.  Vascular: Dorsalis Pedis artery and Posterior Tibial artery pedal pulses are 2/4 bilateral with immedate capillary fill time. There is no pain with calf compression, swelling, warmth, erythema.   Neruologic: Grossly intact via light touch bilateral. Vibratory intact via tuning fork bilateral. Protective threshold with Semmes Wienstein monofilament intact to all pedal sites bilateral. Negative tinel sign.   Musculoskeletal: Tenderness to palpation along the plantar medial tubercle of the calcaneus at the insertion of plantar fascia on the left and right foot. There is no pain along the course of the plantar fascia within the arch of the foot. Plantar fascia appears to be intact. There is no pain with lateral  compression of the calcaneus or pain with vibratory sensation. There is no pain along the course or insertion of the achilles tendon. No other areas of tenderness to bilateral lower extremities. Muscular strength 5/5 in all groups tested bilateral. Equinus bilaterally.   Gait: Unassisted, Nonantalgic.      Assessment & Plan:  41 year old female with bilateral heel pain likely plantar fasciitis; neuritis symptoms -Treatment options discussed including all alternatives, risks, and complications -Etiology of symptoms were discussed. I would that her symptoms are consistent with plantar fasciitis however she does have this burning, neuritis-type symptoms to both of her feet as well which I'm concerned about. I believe we should treat this on both the plantar fasciitis standpoint but also a neuritis nerve standpoint. -X-rays were obtained and reviewed with the patient. No evidence of acute fracture identified today. -Patient elects to proceed with steroid injection into the left and right heel. Under sterile skin preparation, a total of 2.5cc of kenalog 10, 0.5% Marcaine plain, and 2% lidocaine plain were infiltrated into the symptomatic area without complication. A band-aid was applied. Patient tolerated the injection well without complication. Post-injection care with discussed with the patient. Discussed with the patient to ice the area over the next couple of days to help prevent a steroid flare.  -Plantar fascial braces were dispensed -Prescribed a Medrol Dosepak. Once this was complete she can restart Celebrex which was sent to her pharmacy. Discussed side effects the region medications but did not get together which she understands.. -  Continue stretching, icing exercises daily. -Discussed shoe gear modifications and orthotics -Given the nerve type symptoms she is having into recommended nerve conduction test. This is ordered. Help living at this time for she goes out of the country for 6 months next  week.  Ovid Curd, DPM

## 2016-08-03 NOTE — Patient Instructions (Signed)

## 2016-08-03 NOTE — Assessment & Plan Note (Signed)
2/2 medial meniscus tear.  Encouraged physical therapy and home exercises while she is abroad.  Intraarticular injection given today.  Tylenol, aleve, topical medicines reviewed.  F/u when returns.  After informed written consent, patient was seated on exam table. Right knee was prepped with alcohol swab and utilizing anteromedial approach, patient's right knee was injected intraarticularly with 3:1 bupivicaine: depomedrol. Patient tolerated the procedure well without immediate complications.

## 2016-08-03 NOTE — Progress Notes (Signed)
PCP and consultation requested by: Wanda PlumpPaz, Jose E, MD  Subjective:   HPI: Patient is a 41 y.o. female here for right knee pain.  2/28: Patient reports at the end of January she slipped on a wet surface and fell forward directly onto her right knee. Initially pain was just anterior but now feeling posteriorly as well. Worse with standing a long time. Pain level 4/10 and sharp. Not taking any medicines or icing. Has been using an ACE wrap. No prior injury to right knee. No skin changes, numbness, swelling.  5/7: Patient reports she continues to struggle with right knee pain. Feels like knee wants to pop anteriorly. No buckling or catching but does lock where she can't bend knee anymore due to pain. Pain is 6/10, sharp. Not taking anything for this currently. Doing home exercises. No skin changes, numbness.  7/23: Patient returns with continued pain medial right knee. She had to postpone her trip (plans to be gone for 6 months) to help take care of her mother. Pain up to 10/10 and sharp. Bothers with walking, standing, sitting, and stairs. No swelling. No new injuries.  Past Medical History:  Diagnosis Date  . Alopecia   . Anal fistula   . Anxiety and depression 03/07/2014  . GERD (gastroesophageal reflux disease) 02/14/2014  . Goiter 03/07/2014  . Headache(784.0)   . Hemorrhoids   . Kidney stone   . Pseudotumor cerebri     No current outpatient prescriptions on file prior to visit.   No current facility-administered medications on file prior to visit.     Past Surgical History:  Procedure Laterality Date  . CHOLECYSTECTOMY  2004    No Known Allergies  Social History   Social History  . Marital status: Married    Spouse name: N/A  . Number of children: 1  . Years of education: N/A   Occupational History  . stay home    Social History Main Topics  . Smoking status: Never Smoker  . Smokeless tobacco: Never Used  . Alcohol use No  . Drug use: No  . Sexual  activity: Not Currently    Birth control/ protection: None   Other Topics Concern  . 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 Diabetes Mother   . Hypertension Mother   . Esophageal cancer Maternal Grandmother   . Esophageal cancer Maternal Grandfather   . Diabetes Maternal Grandfather   . Colon cancer Neg Hx   . Colon polyps Neg Hx   . Gallbladder disease Neg Hx   . Kidney disease Neg Hx     BP 118/77   Pulse 81   Ht 5\' 6"  (1.676 m)   Wt 273 lb (123.8 kg)   LMP 07/20/2016   BMI 44.06 kg/m   Review of Systems: See HPI above.     Objective:  Physical Exam:  Gen: NAD, comfortable in exam room  Right knee: No gross deformity, ecchymoses, effusion. TTP medial joint line.  No other tenderness including lateral joint line, post patellar facets. FROM. Negative ant/post drawers. Negative valgus/varus testing. Negative lachmanns. Positive mcmurrays, apleys, negative patellar apprehension. NV intact distally.  Left knee: FROM without pain.   Assessment & Plan:  1. Right knee injury - 2/2 medial meniscus tear.  Encouraged physical therapy and home exercises while she is  abroad.  Intraarticular injection given today.  Tylenol, aleve, topical medicines reviewed.  F/u when returns.  After informed written consent, patient was seated on exam table. Right knee was prepped with alcohol swab and utilizing anteromedial approach, patient's right knee was injected intraarticularly with 3:1 bupivicaine: depomedrol. Patient tolerated the procedure well without immediate complications.

## 2016-08-04 ENCOUNTER — Telehealth: Payer: Self-pay | Admitting: *Deleted

## 2016-08-04 DIAGNOSIS — M792 Neuralgia and neuritis, unspecified: Secondary | ICD-10-CM

## 2016-08-04 NOTE — Telephone Encounter (Addendum)
-----   Message from Vivi BarrackMatthew R Wagoner, DPM sent at 08/03/2016  9:36 AM EDT ----- Can you please order a NCV for her due to burning pain in both feet? Thanks. Can this be done ASAP as she is going out of the country. 08/04/2016-Faxed orders to [facility].

## 2016-08-06 ENCOUNTER — Ambulatory Visit: Payer: Medicaid Other | Admitting: Internal Medicine

## 2016-10-17 ENCOUNTER — Telehealth: Payer: Self-pay | Admitting: Internal Medicine

## 2016-10-17 NOTE — Telephone Encounter (Signed)
Advise patient, due for a thyroid ultrasound, please arrange, DX thyroid nodules

## 2016-10-18 NOTE — Telephone Encounter (Signed)
Pt is overseas for next 6 months (left in July) visiting husband.

## 2016-10-18 NOTE — Telephone Encounter (Signed)
noted 

## 2018-08-14 ENCOUNTER — Encounter: Payer: Self-pay | Admitting: Internal Medicine

## 2018-12-12 HISTORY — PX: CYSTOSCOPY W/ URETERAL STENT PLACEMENT: SHX1429

## 2019-07-23 ENCOUNTER — Encounter: Payer: Self-pay | Admitting: Internal Medicine

## 2019-07-23 ENCOUNTER — Other Ambulatory Visit: Payer: Self-pay

## 2019-07-23 ENCOUNTER — Telehealth: Payer: Self-pay | Admitting: *Deleted

## 2019-07-23 ENCOUNTER — Ambulatory Visit: Payer: Medicaid Other | Admitting: Internal Medicine

## 2019-07-23 VITALS — BP 124/82 | HR 85 | Temp 98.2°F | Resp 18 | Ht 66.0 in | Wt 281.5 lb

## 2019-07-23 DIAGNOSIS — E041 Nontoxic single thyroid nodule: Secondary | ICD-10-CM

## 2019-07-23 DIAGNOSIS — Z87442 Personal history of urinary calculi: Secondary | ICD-10-CM

## 2019-07-23 DIAGNOSIS — K589 Irritable bowel syndrome without diarrhea: Secondary | ICD-10-CM | POA: Insufficient documentation

## 2019-07-23 DIAGNOSIS — R202 Paresthesia of skin: Secondary | ICD-10-CM

## 2019-07-23 DIAGNOSIS — Z1272 Encounter for screening for malignant neoplasm of vagina: Secondary | ICD-10-CM

## 2019-07-23 DIAGNOSIS — Z3169 Encounter for other general counseling and advice on procreation: Secondary | ICD-10-CM

## 2019-07-23 DIAGNOSIS — E049 Nontoxic goiter, unspecified: Secondary | ICD-10-CM

## 2019-07-23 DIAGNOSIS — Z1231 Encounter for screening mammogram for malignant neoplasm of breast: Secondary | ICD-10-CM

## 2019-07-23 DIAGNOSIS — Z01419 Encounter for gynecological examination (general) (routine) without abnormal findings: Secondary | ICD-10-CM

## 2019-07-23 DIAGNOSIS — G932 Benign intracranial hypertension: Secondary | ICD-10-CM

## 2019-07-23 NOTE — Progress Notes (Signed)
Pre visit review using our clinic review tool, if applicable. No additional management support is needed unless otherwise documented below in the visit note. 

## 2019-07-23 NOTE — Patient Instructions (Addendum)
   GO TO THE LAB : Get the blood work     GO TO THE FRONT DESK, PLEASE SCHEDULE YOUR APPOINTMENTS Come back for physical exam in 3 months   STOP BY THE FIRST FLOOR: Schedule ultrasound of your thyroid  We are referring you to a gynecologist

## 2019-07-23 NOTE — Progress Notes (Addendum)
Subjective:    Patient ID: Stephanie Davies, female    DOB: 11/11/1975, 44 y.o.   MRN: [id number]  DOS:  07/23/2019 Type of visit - description: To reestablish, last visit almost 3 years ago, here with her husband  Her main concern is infertility.  Also complaining of bilateral feet paresthesias described as numbness, needles, fire. I ask about back pain and she reports low back pain bilaterally.  She also has a history of goiter, does not feel the gland has increased in size here lately  She brought some records from [place] where she spent several months last year.  Wt Readings from Last 3 Encounters:  07/23/19 281 lb 8 oz (127.7 kg)  08/02/16 273 lb (123.8 kg)  08/02/16 275 lb 2 oz (124.8 kg)     Review of Systems See above   Past Medical History:  Diagnosis Date  . Alopecia   . Anal fistula   . Anxiety and depression 03/07/2014  . GERD (gastroesophageal reflux disease) 02/14/2014  . Goiter 03/07/2014  . Headache(784.0)   . Hemorrhoids   . Kidney stone   . Pseudotumor cerebri     Past Surgical History:  Procedure Laterality Date  . CHOLECYSTECTOMY  2004    Allergies as of 07/23/2019   No Known Allergies     Medication List       Accurate as of July 23, 2019  2:35 PM. If you have any questions, ask your nurse or doctor.        celecoxib 200 MG capsule Commonly known as: CeleBREX Take 1 capsule (200 mg total) by mouth daily.   citalopram 20 MG tablet Commonly known as: CELEXA Take 2 tablets (40 mg total) by mouth daily.   hydrOXYzine 10 MG tablet Commonly known as: ATARAX/VISTARIL Take 1 tablet (10 mg total) by mouth 2 (two) times daily as needed (anxiety).   HYOSCYAMINE PO Take 20 mg by mouth in the morning and at bedtime. Buscopan 10mg    methylPREDNISolone 4 MG Tbpk tablet Commonly known as: MEDROL DOSEPAK Take as directed   OMEPRAZOLE PO Take 2 capsules by mouth in the morning and at bedtime. Duspatalin 200mg    pantoprazole 40 MG  tablet Commonly known as: PROTONIX Take 1 tablet (40 mg total) by mouth daily.   sertraline 50 MG tablet Commonly known as: ZOLOFT Take 50 mg by mouth daily.          Objective:   Physical Exam Neck:     BP 124/82 (BP Location: Left Arm, Patient Position: Sitting, Cuff Size: Normal)   Pulse 85   Temp 98.2 F (36.8 C) (Oral)   Resp 18   Ht 5\' 6"  (1.676 m)   Wt 281 lb 8 oz (127.7 kg)   LMP 07/06/2019 (Approximate)   SpO2 95%   BMI 45.44 kg/m  General:   Well developed, NAD, BMI noted. HEENT:  Normocephalic . Face symmetric, atraumatic Neck: Very large and nodular goiter, not tender, not warm Lungs:  CTA B Normal respiratory effort, no intercostal retractions, no accessory muscle use. Heart: RRR,  no murmur.  Lower extremities: no pretibial edema bilaterally.  Good pedal pulses Skin: Not pale. Not jaundice Neurologic:  alert & oriented X3.  Speech normal, gait appropriate for age and unassisted. DTR symmetric Pinprick examination normal Psych--  Cognition and judgment appear intact.  Cooperative with normal attention span and concentration.  Behavior appropriate. No anxious or depressed appearing.      Assessment      Assessment  Anxiety, depression pseudotumor cerebri, headaches Goiter : [place] 03-2014, bx  Done 11-2015  IBS Kidney stones, kidney stent 12/2018 renal cysts found on a renal ultrasound in [place]  PLAN: Infertility:  since she delivered her daughter few years ago , has not been able to get pregnant, has never used any form of birth control.  Menses are normal, LMP 07/06/2019. We will refer to gynecology Paresthesias: She reports 40-month history of paresthesias however back in 2018 she already complained about them.  At the time B12, folic acid, A1c were all normal. Was referred to neurology but that never happened.  Refer her again today.. Check a CMP, CBC. She also has back pain, consider MRI of the back. Goiter:  rx a Ultrasound, TFTs. IBS?   She was taking some sort of antispasmodic prescribed in [place], that is not available in this country, if she needs antispasmodic will offer hyoscyamine. Records from [place] : Apparently in 2020, she had urosepsis. Had a right ureteroscopy  CT chest: No PE. Parenchyma with findings suggestive of a lung infection. Thyroid nodules noted with retrosternal extension. Right liver lobe hypodense lesion, recommend US/MRI for follow-up Echo: Normal Renal ultrasound/2021: Left kidney cysts, unchanged from previous imaging. Plan: Abdominal ultrasound on RTC RTC 3 months CPX   This visit occurred during the SARS-CoV-2 public health emergency.  Safety protocols were in place, including screening questions prior to the visit, additional usage of staff PPE, and extensive cleaning of exam room while observing appropriate contact time as indicated for disinfecting solutions.

## 2019-07-23 NOTE — Telephone Encounter (Signed)
Error

## 2019-07-24 ENCOUNTER — Telehealth: Payer: Self-pay | Admitting: Internal Medicine

## 2019-07-24 LAB — CBC WITH DIFFERENTIAL/PLATELET
Basophils Absolute: 0.1 10*3/uL (ref 0.0–0.1)
Basophils Relative: 0.7 % (ref 0.0–3.0)
Eosinophils Absolute: 0.2 10*3/uL (ref 0.0–0.7)
Eosinophils Relative: 2 % (ref 0.0–5.0)
HCT: 39.1 % (ref 36.0–46.0)
Hemoglobin: 12.8 g/dL (ref 12.0–15.0)
Lymphocytes Relative: 16.8 % (ref 12.0–46.0)
Lymphs Abs: 1.7 10*3/uL (ref 0.7–4.0)
MCHC: 32.8 g/dL (ref 30.0–36.0)
MCV: 82 fl (ref 78.0–100.0)
Monocytes Absolute: 0.5 10*3/uL (ref 0.1–1.0)
Monocytes Relative: 5.3 % (ref 3.0–12.0)
Neutro Abs: 7.4 10*3/uL (ref 1.4–7.7)
Neutrophils Relative %: 75.2 % (ref 43.0–77.0)
Platelets: 367 10*3/uL (ref 150.0–400.0)
RBC: 4.76 Mil/uL (ref 3.87–5.11)
RDW: 16.2 % — ABNORMAL HIGH (ref 11.5–15.5)
WBC: 9.8 10*3/uL (ref 4.0–10.5)

## 2019-07-24 LAB — COMPREHENSIVE METABOLIC PANEL
ALT: 26 U/L (ref 0–35)
AST: 17 U/L (ref 0–37)
Albumin: 3.9 g/dL (ref 3.5–5.2)
Alkaline Phosphatase: 108 U/L (ref 39–117)
BUN: 11 mg/dL (ref 6–23)
CO2: 25 mEq/L (ref 19–32)
Calcium: 9.2 mg/dL (ref 8.4–10.5)
Chloride: 105 mEq/L (ref 96–112)
Creatinine, Ser: 0.74 mg/dL (ref 0.40–1.20)
GFR: 86.15 mL/min (ref >60.00)
Glucose, Bld: 87 mg/dL (ref 70–99)
Potassium: 4.1 mEq/L (ref 3.5–5.1)
Sodium: 138 mEq/L (ref 135–145)
Total Bilirubin: 0.3 mg/dL (ref 0.2–1.2)
Total Protein: 6.7 g/dL (ref 6.0–8.3)

## 2019-07-24 LAB — T4, FREE: Free T4: 0.7 ng/dL (ref 0.60–1.60)

## 2019-07-24 LAB — TSH: TSH: 1.19 u[IU]/mL (ref 0.35–4.50)

## 2019-07-24 LAB — T3, FREE: T3, Free: 3.3 pg/mL (ref 2.3–4.2)

## 2019-07-24 NOTE — Telephone Encounter (Signed)
Called patient to see if she found her insurance card. Patient has [employer]. Need to check for preaut on Ultrasound

## 2019-07-26 NOTE — Assessment & Plan Note (Signed)
Infertility:  since she delivered her daughter few years ago , has not been able to get pregnant, has never used any form of birth control.  Menses are normal, LMP 07/06/2019. We will refer to gynecology Paresthesias: She reports 61-month history of paresthesias however back in 2018 she already complained about them.  At the time B12, folic acid, A1c were all normal. Was referred to neurology but that never happened.  Refer her again today.. Check a CMP, CBC. She also has back pain, consider MRI of the back. Goiter:  rx a Ultrasound, TFTs. IBS?  She was taking some sort of antispasmodic prescribed in [place], that is not available in this country, if she needs antispasmodic will offer hyoscyamine. Records from [place] : Apparently in 2020, she had urosepsis. Had a right ureteroscopy  CT chest: No PE. Parenchyma with findings suggestive of a lung infection. Thyroid nodules noted with retrosternal extension. Right liver lobe hypodense lesion, recommend US/MRI for follow-up Echo: Normal Renal ultrasound/2021: Left kidney cysts, unchanged from previous imaging. Plan: Abdominal ultrasound on RTC RTC 3 months CPX

## 2019-07-31 ENCOUNTER — Other Ambulatory Visit (HOSPITAL_BASED_OUTPATIENT_CLINIC_OR_DEPARTMENT_OTHER): Payer: Medicaid Other

## 2019-08-02 ENCOUNTER — Ambulatory Visit (HOSPITAL_BASED_OUTPATIENT_CLINIC_OR_DEPARTMENT_OTHER)
Admission: RE | Admit: 2019-08-02 | Discharge: 2019-08-02 | Disposition: A | Discharge status: Home / Self Care | Payer: Medicaid Other | Source: Ambulatory Visit | Attending: Internal Medicine | Admitting: Internal Medicine

## 2019-08-02 ENCOUNTER — Other Ambulatory Visit: Payer: Self-pay

## 2019-08-02 DIAGNOSIS — E049 Nontoxic goiter, unspecified: Secondary | ICD-10-CM | POA: Diagnosis not present

## 2019-08-02 DIAGNOSIS — E042 Nontoxic multinodular goiter: Secondary | ICD-10-CM | POA: Diagnosis not present

## 2019-08-06 NOTE — Addendum Note (Signed)
Addended byConrad [place] D on: 08/06/2019 08:08 AM   Modules accepted: Orders

## 2019-08-09 NOTE — Addendum Note (Signed)
Addended byConrad [place] D on: 08/09/2019 01:05 PM   Modules accepted: Orders

## 2019-08-10 ENCOUNTER — Ambulatory Visit (INDEPENDENT_AMBULATORY_CARE_PROVIDER_SITE_OTHER): Payer: Medicaid Other | Admitting: Internal Medicine

## 2019-08-10 ENCOUNTER — Other Ambulatory Visit: Payer: Self-pay

## 2019-08-10 ENCOUNTER — Encounter: Payer: Self-pay | Admitting: Internal Medicine

## 2019-08-10 VITALS — BP 124/82 | HR 91 | Temp 98.5°F | Resp 18 | Ht 66.0 in | Wt 281.4 lb

## 2019-08-10 DIAGNOSIS — R1013 Epigastric pain: Secondary | ICD-10-CM

## 2019-08-10 MED ORDER — HYOSCYAMINE SULFATE 0.125 MG PO TABS: 0.1250 mg | ORAL_TABLET | ORAL | 0 refills | Status: DC | PRN

## 2019-08-10 MED ORDER — PANTOPRAZOLE SODIUM 40 MG PO TBEC
40.0000 mg | DELAYED_RELEASE_TABLET | Freq: Every day | ORAL | 1 refills | Status: DC
Start: 2019-08-10 — End: 2019-08-29

## 2019-08-10 MED ORDER — KETOCONAZOLE 2 % EX SHAM: 1.0000 "application " | MEDICATED_SHAMPOO | CUTANEOUS | 3 refills | Status: DC

## 2019-08-10 NOTE — Progress Notes (Signed)
Subjective:    Patient ID: Stephanie Davies, female    DOB: 05-19-1975, 44 y.o.   MRN: [id number]  DOS:  08/10/2019 Type of visit - description: Acute Chief complaint today is upper abdominal pain, similar to what happened few months ago when she was in [place]. Symptoms resurface 5 days ago:  Reports waves of bilateral upper abdominal pain described as a burning sensation, more concentrated at the epigastric area, no radiation to the back back but radiates to the upper chest. Symptoms last 1 to 2 minutes and then come back  Symptoms do not change with food intake. She is not taking any NSAIDs. No weight loss recently  Wt Readings from Last 3 Encounters:  08/10/19 (!) 281 lb 6 oz (127.6 kg)  07/23/19 281 lb 8 oz (127.7 kg)  08/02/16 273 lb (123.8 kg)     Review of Systems LMP July 16. No fever chills No nausea, vomiting, blood in the stools. Still has on and off episodic diarrhea. No dysuria or gross hematuria  Past Medical History:  Diagnosis Date  . Alopecia   . Anal fistula   . Anxiety and depression 03/07/2014  . GERD (gastroesophageal reflux disease) 02/14/2014  . Goiter 03/07/2014  . Headache(784.0)   . Hemorrhoids   . Kidney stone   . Pseudotumor cerebri     Past Surgical History:  Procedure Laterality Date  . CHOLECYSTECTOMY  2004  . kidney stent: 12/2018 at [facility]      Allergies as of 08/10/2019   No Known Allergies     Medication List       Accurate as of August 10, 2019  3:30 PM. If you have any questions, ask your nurse or doctor.        HYOSCYAMINE PO Take 20 mg by mouth in the morning and at bedtime. Buscopan 10mg    OMEPRAZOLE PO Take 2 capsules by mouth in the morning and at bedtime. Duspatalin 200mg    sertraline 50 MG tablet Commonly known as: ZOLOFT Take 50 mg by mouth daily.          Objective:   Physical Exam BP 124/82 (BP Location: Left Arm, Patient Position: Sitting, Cuff Size: Normal)   Pulse 91   Temp 98.5 F (36.9 C) (Oral)    Resp 18   Ht 5\' 6"  (1.676 m)   Wt (!) 281 lb 6 oz (127.6 kg)   LMP 07/27/2019 (Exact Date)   SpO2 98%   BMI 45.42 kg/m  General:   Well developed, NAD, BMI noted.  HEENT:  Normocephalic . Face symmetric, atraumatic Lungs:  CTA B Normal respiratory effort, no intercostal retractions, no accessory muscle use. Heart: RRR,  no murmur.  Abdomen:  Not distended, soft, mild to moderately tender at the upper abdomen, no mass, no rebound.  Less tender when distracted.  Good bowel sounds. Skin: Not pale. Not jaundice Lower extremities: no pretibial edema bilaterally  Neurologic:  alert & oriented X3.  Speech normal, gait appropriate for age and unassisted Psych--  Cognition and judgment appear intact.  Cooperative with normal attention span and concentration.  Behavior appropriate. No anxious or depressed appearing.     Assessment      Assessment Anxiety, depression pseudotumor cerebri, headaches Goiter : 03-2014, bx  Done 11-2015  IBS Kidney stones, kidney stent 12/2018 renal cysts found on a renal ultrasound in 08-01-1992  PLAN: Upper abdominal pain: As described above, clinical exam with no alarming findings , had similar symptoms few months ago when  she was in [place]. See last visit, had an abdominal ultrasound and today she also reports a EGD. History of cholecystectomy. Suspect gastritis versus PUD. Abdomen US done several months ago show left kidney cyst, right liver lobe hypodense lesion. She also had an EGD while me in [place] Plan: Patient to get the report of EGD Pantoprazole daily Abdominal ultrasound. If not improving consider GI referral. Scalp itching: Also reports scalp itching, historically ketoconazole shampoo helped, RF sent.  Denies any rash. RTC for reassessment: 1 month    This visit occurred during the SARS-CoV-2 public health emergency.  Safety protocols were in place, including screening questions prior to the visit, additional usage of staff PPE,  and extensive cleaning of exam room while observing appropriate contact time as indicated for disinfecting solutions.

## 2019-08-10 NOTE — Progress Notes (Signed)
Pre visit review using our clinic review tool, if applicable. No additional management support is needed unless otherwise documented below in the visit note. 

## 2019-08-10 NOTE — Patient Instructions (Addendum)
Take pantoprazole 1 tablet every morning before breakfast  Please bring a copy of the endoscopy (EGD) that was done while you were in [place]  We will schedule ultrasound of your abdomen  Go to the ER if severe symptoms, change in the color of the stools, nausea, vomiting.     GO TO THE FRONT DESK, PLEASE SCHEDULE YOUR APPOINTMENTS Come back for a checkup in 1 month

## 2019-08-12 NOTE — Assessment & Plan Note (Signed)
Upper abdominal pain: As described above, clinical exam with no alarming findings , had similar symptoms few months ago when she was in [place]. See last visit, had an abdominal ultrasound and today she also reports a EGD. History of cholecystectomy. Suspect gastritis versus PUD. Abdomen US done several months ago show left kidney cyst, right liver lobe hypodense lesion. She also had an EGD while me in [place] Plan: Patient to get the report of EGD Pantoprazole daily Abdominal ultrasound. If not improving consider GI referral. Scalp itching: Also reports scalp itching, historically ketoconazole shampoo helped, RF sent.  Denies any rash. RTC for reassessment: 1 month

## 2019-08-15 NOTE — Addendum Note (Signed)
Addended byConrad Palouse D on: 08/15/2019 11:57 AM   Modules accepted: Orders

## 2019-08-23 ENCOUNTER — Encounter: Payer: Self-pay | Admitting: Internal Medicine

## 2019-08-23 DIAGNOSIS — E041 Nontoxic single thyroid nodule: Secondary | ICD-10-CM | POA: Diagnosis not present

## 2019-08-29 ENCOUNTER — Other Ambulatory Visit: Payer: Self-pay

## 2019-08-29 ENCOUNTER — Encounter: Payer: Self-pay | Admitting: Internal Medicine

## 2019-08-29 ENCOUNTER — Ambulatory Visit: Payer: Medicaid Other | Admitting: Internal Medicine

## 2019-08-29 VITALS — BP 105/68 | HR 79 | Temp 98.0°F | Resp 16 | Ht 66.0 in | Wt 285.2 lb

## 2019-08-29 DIAGNOSIS — R109 Unspecified abdominal pain: Secondary | ICD-10-CM | POA: Diagnosis not present

## 2019-08-29 DIAGNOSIS — R1032 Left lower quadrant pain: Secondary | ICD-10-CM | POA: Diagnosis not present

## 2019-08-29 DIAGNOSIS — E049 Nontoxic goiter, unspecified: Secondary | ICD-10-CM

## 2019-08-29 LAB — POC URINALSYSI DIPSTICK (AUTOMATED)
Bilirubin, UA: NEGATIVE
Glucose, UA: NEGATIVE
Ketones, UA: NEGATIVE
Leukocytes, UA: NEGATIVE
Nitrite, UA: NEGATIVE
Protein, UA: NEGATIVE
Spec Grav, UA: 1.01 (ref 1.010–1.025)
Urobilinogen, UA: 0.2 E.U./dL
pH, UA: 8.5 — AB (ref 5.0–8.0)

## 2019-08-29 LAB — POCT URINE PREGNANCY: Preg Test, Ur: NEGATIVE

## 2019-08-29 MED ORDER — PANTOPRAZOLE SODIUM 40 MG PO TBEC
40.0000 mg | DELAYED_RELEASE_TABLET | Freq: Every day | ORAL | 3 refills | Status: DC
Start: 2019-08-29 — End: 2019-11-04

## 2019-08-29 NOTE — Progress Notes (Signed)
Subjective:    Patient ID: Stephanie Davies, female    DOB: 01-08-1976, 44 y.o.   MRN: [id number]  DOS:  08/29/2019 Type of visit - description: Follow-up from previous visit She was having upper abdominal pain, she started PPIs, symptoms are significantly better essentially gone. However she has developed a left-sided abdominal pain 5 days ago.  No flank pain per se It is steady but increases and decreases in intensity throughout the day.  Review of Systems Denies fever chills. Appetite is good No constipation, no diarrhea or blood in the stools. No nausea or vomiting No dysuria, gross hematuria No rash noted on the flank or abdomen LMP: Last menses ended yesterday.  Past Medical History:  Diagnosis Date  . Alopecia   . Anal fistula   . Anxiety and depression 03/07/2014  . GERD (gastroesophageal reflux disease) 02/14/2014  . Goiter 03/07/2014  . Headache(784.0)   . Hemorrhoids   . Kidney stone   . Pseudotumor cerebri     Past Surgical History:  Procedure Laterality Date  . CHOLECYSTECTOMY  2004  . kidney stent: 12/2018 at [facility]      Allergies as of 08/29/2019   No Known Allergies     Medication List       Accurate as of August 29, 2019 11:59 PM. If you have any questions, ask your nurse or doctor.        hyoscyamine 0.125 MG tablet Commonly known as: LEVSIN Take 1 tablet (0.125 mg total) by mouth every 4 (four) hours as needed.   ketoconazole 2 % shampoo Commonly known as: NIZORAL Apply 1 application topically 2 (two) times a week.   pantoprazole 40 MG tablet Commonly known as: PROTONIX Take 1 tablet (40 mg total) by mouth daily before breakfast.   sertraline 50 MG tablet Commonly known as: ZOLOFT Take 50 mg by mouth daily.          Objective:   Physical Exam Abdominal:      BP 105/68 (BP Location: Left Arm, Patient Position: Sitting, Cuff Size: Normal)   Pulse 79   Temp 98 F (36.7 C) (Oral)   Resp 16   Ht 5\' 6"  (1.676 m)   Wt 285 lb 4 oz  (129.4 kg)   SpO2 96%   BMI 46.04 kg/m  General:   Well developed, NAD, BMI noted.  HEENT:  Normocephalic . Face symmetric, atraumatic  Abdomen:  Not distended, soft, see graphic. No CVA tenderness Skin: Not pale. Not jaundice Lower extremities: no pretibial edema bilaterally  Neurologic:  alert & oriented X3.  Speech normal, gait appropriate for age and unassisted Psych--  Cognition and judgment appear intact.  Cooperative with normal attention span and concentration.  Behavior appropriate. No anxious or depressed appearing.     Assessment     Assessment Anxiety, depression pseudotumor cerebri, headaches Goiter : 03-2014, bx  Done 11-2015  IBS Kidney stones, kidney stent R 12/2018 renal cysts found on a renal ultrasound in 01/2019 Goiter: Biopsy 08/23/2019, [facility] category 2 benign   PLAN: Upper abdominal pain: Much improved with PPIs. Was not able to get me a report of the EGD she had in 10/23/2019 Abdominal ultrasound pending, encourage to proceed. Left abdomen pain: She has developed a new pain located at this time at the left abdomen, + TTP left abdomen, see physical exam. No LUTS, no GI symptoms.  She does have a history of R kidney stones R and L renal cysts. Udip: + Blood, she just  finished her menses (contaminant?). UPT negative  PLAN  UA, urine culture, US abdomen ASAP, if she continue with symptoms will need CT (kidney stone? , Diverticulitis?). Addendum: Ultrasound 08/30/2019: Hepatomegaly, steatosis and splenomegaly (findings unlikely to explain  LLQ abdominal pain) Left message for the patient: Plan is to get an abdominal and pelvic CT with contrast.  Rule out diverticulitis.  If CT negative: IBS? Goiter: Recently biopsied benign. RTC 3 months    This visit occurred during the SARS-CoV-2 public health emergency.  Safety protocols were in place, including screening questions prior to the visit, additional usage of staff PPE, and extensive cleaning of exam  room while observing appropriate contact time as indicated for disinfecting solutions.

## 2019-08-29 NOTE — Patient Instructions (Addendum)
To schedule abdominal ultrasound- contact [facility]: [phone]   Your really need ultrasound on tomorrow  If you develop fever, chills, intense pain either let me know or go to the emergency room, you might have another kidney stone or urinary tract infection.   [facility]- [phone]   GO TO THE FRONT DESK, PLEASE SCHEDULE YOUR APPOINTMENTS Come back for a checkup in 3 months

## 2019-08-29 NOTE — Progress Notes (Signed)
Pre visit review using our clinic review tool, if applicable. No additional management support is needed unless otherwise documented below in the visit note. 

## 2019-08-30 DIAGNOSIS — R1013 Epigastric pain: Secondary | ICD-10-CM | POA: Diagnosis not present

## 2019-08-30 DIAGNOSIS — R109 Unspecified abdominal pain: Secondary | ICD-10-CM | POA: Diagnosis not present

## 2019-08-30 LAB — URINALYSIS, ROUTINE W REFLEX MICROSCOPIC
Bilirubin Urine: NEGATIVE
Ketones, ur: NEGATIVE
Leukocytes,Ua: NEGATIVE
Nitrite: NEGATIVE
Specific Gravity, Urine: 1.015 (ref 1.000–1.030)
Total Protein, Urine: NEGATIVE
Urine Glucose: NEGATIVE
Urobilinogen, UA: 0.2 (ref 0.0–1.0)
pH: 8 (ref 5.0–8.0)

## 2019-08-31 LAB — URINE CULTURE
MICRO NUMBER:: 10847496
Result:: NO GROWTH
SPECIMEN QUALITY:: ADEQUATE

## 2019-09-02 NOTE — Assessment & Plan Note (Signed)
Upper abdominal pain: Much improved with PPIs. Was not able to get me a report of the EGD she had in [place] Abdominal ultrasound pending, encourage to proceed. Left abdomen pain: She has developed a new pain located at this time at the left abdomen, + TTP left abdomen, see physical exam. No LUTS, no GI symptoms.  She does have a history of R kidney stones R and L renal cysts. Udip: + Blood, she just finished her menses (contaminant?). UPT negative  PLAN  UA, urine culture, US abdomen ASAP, if she continue with symptoms will need CT (kidney stone? , Diverticulitis?). Addendum: Ultrasound 08/30/2019: Hepatomegaly, steatosis and splenomegaly (findings unlikely to explain  LLQ abdominal pain) Left message for the patient: Plan is to get an abdominal and pelvic CT with contrast.  Rule out diverticulitis.  If CT negative: IBS? Goiter: Recently biopsied benign. RTC 3 months

## 2019-09-04 ENCOUNTER — Telehealth: Payer: Self-pay | Admitting: Internal Medicine

## 2019-09-04 NOTE — Telephone Encounter (Signed)
Noted, thank you

## 2019-09-04 NOTE — Telephone Encounter (Signed)
Caller: [facility]  Call Back # [phone]  Patient was called 4 times to schedule imaging, no answer.

## 2019-09-04 NOTE — Telephone Encounter (Signed)
FYI

## 2019-09-05 ENCOUNTER — Telehealth: Payer: Self-pay | Admitting: Internal Medicine

## 2019-09-05 NOTE — Telephone Encounter (Signed)
[facility]erkley Harvey # [id number] valid 08/30/19-10/28/19 Called and updated auth to [facility] [address] [facility] [place] Should show updated auth location in [place] 8/26

## 2019-09-05 NOTE — Telephone Encounter (Signed)
Marisue Ivan said that pt is not approved through [facility] it is approved for a different facility. Pt has an appointment with them on 8/27 but she is not approved and this needs to be fixed. She said you can give her a call tomorrow or you can call the authorization and get patient approved for [facility].

## 2019-09-06 NOTE — Telephone Encounter (Signed)
Facility change is not reflected in [employer] this morning. I called [facility] again and spoke to [place] and [employer]. I was advised it is correct in their system and should reflect in [employer] before 8/27. I called Marisue Ivan at [facility] [address] and advised her of above.

## 2019-09-07 ENCOUNTER — Ambulatory Visit: Payer: Medicaid Other | Admitting: Internal Medicine

## 2019-09-07 DIAGNOSIS — N200 Calculus of kidney: Secondary | ICD-10-CM | POA: Diagnosis not present

## 2019-09-07 DIAGNOSIS — N281 Cyst of kidney, acquired: Secondary | ICD-10-CM | POA: Diagnosis not present

## 2019-09-20 ENCOUNTER — Telehealth: Payer: Self-pay | Admitting: Internal Medicine

## 2019-09-20 NOTE — Telephone Encounter (Signed)
Send the patient a message: CT results reviewed, they are okay.  Will discuss in detail on RTC.  Office visit if symptoms persist ===== CT abdomen and pelvis was done 09/07/2019: + Hepatomegaly, small simple cyst at the right hepatic lobe, mild diffuse fatty liver. Splenomegaly Gallbladder absent surgically. Small bilateral simple cortical cysts and nonobstructing calyceal calculi. No diverticulosis or diverticulitis Can discuss in detail on RTC

## 2019-09-20 NOTE — Telephone Encounter (Signed)
Mychart message sent.

## 2019-09-27 ENCOUNTER — Telehealth (INDEPENDENT_AMBULATORY_CARE_PROVIDER_SITE_OTHER): Payer: Medicaid Other | Admitting: Internal Medicine

## 2019-09-27 ENCOUNTER — Encounter: Payer: Self-pay | Admitting: Internal Medicine

## 2019-09-27 ENCOUNTER — Other Ambulatory Visit: Payer: Self-pay

## 2019-09-27 VITALS — Ht 66.0 in | Wt 285.0 lb

## 2019-09-27 DIAGNOSIS — R109 Unspecified abdominal pain: Secondary | ICD-10-CM | POA: Diagnosis not present

## 2019-09-27 DIAGNOSIS — J069 Acute upper respiratory infection, unspecified: Secondary | ICD-10-CM

## 2019-09-27 NOTE — Progress Notes (Signed)
Pre visit review using our clinic review tool, if applicable. No additional management support is needed unless otherwise documented below in the visit note. 

## 2019-09-27 NOTE — Progress Notes (Signed)
Subjective:    Patient ID: Stephanie Davies, female    DOB: 1975-06-22, 44 y.o.   MRN: [id number]  DOS:  09/27/2019 Type of visit - description: Virtual Visit via Video Note  I connected with the above patient  by a video enabled telemedicine application and verified that I am speaking with the correct person using two identifiers.   THIS ENCOUNTER IS A VIRTUAL VISIT DUE TO COVID-19 - PATIENT WAS NOT SEEN IN THE OFFICE. PATIENT HAS CONSENTED TO VIRTUAL VISIT / TELEMEDICINE VISIT   Location of patient: home  Location of provider: office  Persons participating in the virtual visit: patient, provider   I discussed the limitations of evaluation and management by telemedicine and the availability of in person appointments. The patient expressed understanding and agreed to proceed.  Acute Symptoms started 10 days ago: Dry cough with some chest congestion; chest hurts when she coughs. Runny nose. No sick contacts that she can tell. Had a point-of-care rapid test for Covid which was negative.  Denies fever chills No nausea or vomiting Mild diarrhea few times No myalgias No sneezing, itchy nose or throat. Chest pain only with cough, no difficulty breathing.  See last visit, continue with abdominal pain.  Review of Systems See above   Past Medical History:  Diagnosis Date  . Alopecia   . Anal fistula   . Anxiety and depression 03/07/2014  . GERD (gastroesophageal reflux disease) 02/14/2014  . Goiter 03/07/2014  . Headache(784.0)   . Hemorrhoids   . Kidney stone   . Pseudotumor cerebri     Past Surgical History:  Procedure Laterality Date  . CHOLECYSTECTOMY  2004  . kidney stent: 12/2018 at [facility]      Allergies as of 09/27/2019   No Known Allergies     Medication List       Accurate as of September 27, 2019 11:59 PM. If you have any questions, ask your nurse or doctor.        hyoscyamine 0.125 MG tablet Commonly known as: LEVSIN Take 1 tablet (0.125 mg total) by  mouth every 4 (four) hours as needed.   ketoconazole 2 % shampoo Commonly known as: NIZORAL Apply 1 application topically 2 (two) times a week.   pantoprazole 40 MG tablet Commonly known as: PROTONIX Take 1 tablet (40 mg total) by mouth daily before breakfast.   sertraline 50 MG tablet Commonly known as: ZOLOFT Take 50 mg by mouth daily.          Objective:   Physical Exam Ht 5\' 6"  (1.676 m)   Wt 285 lb (129.3 kg)   LMP 09/19/2019 (Exact Date)   BMI 46.00 kg/m  This is a video visit, she is alert oriented x3, in no apparent distress, speaking in complete sentences    Assessment      Assessment Anxiety, depression pseudotumor cerebri, headaches Goiter : 11/19/2019 03-2014, bx  Done 11-2015  IBS Kidney stones, kidney stent R 12/2018 renal cysts found on a renal ultrasound in 01/2019 Goiter: Biopsy 08/23/2019, [facility] category 2 benign   PLAN: URI?  Upper respiratory symptoms probably due to a URI, had negative home testing for Covid. Plan: Recommend to get a PCR testing at her local pharmacy, if + let me know. Otherwise rest, fluids, continue Allegra, Tylenol, Robitussin-DM.  She verbalized understanding. Left abdominal pain: See last visit, CT was okay except for mild fatty liver and splenomegaly (last CBC normal).  Request further evaluation, will refer to GI.   I  discussed the assessment and treatment plan with the patient. The patient was provided an opportunity to ask questions and all were answered. The patient agreed with the plan and demonstrated an understanding of the instructions.   The patient was advised to call back or seek an in-person evaluation if the symptoms worsen or if the condition fails to improve as anticipated.

## 2019-09-28 ENCOUNTER — Ambulatory Visit: Payer: Self-pay | Admitting: Neurology

## 2019-09-30 NOTE — Assessment & Plan Note (Signed)
URI?  Upper respiratory symptoms probably due to a URI, had negative home testing for Covid. Plan: Recommend to get a PCR testing at her local pharmacy, if + let me know. Otherwise rest, fluids, continue Allegra, Tylenol, Robitussin-DM.  She verbalized understanding. Left abdominal pain: See last visit, CT was okay except for mild fatty liver and splenomegaly (last CBC normal).  Request further evaluation, will refer to GI.

## 2019-10-04 ENCOUNTER — Encounter: Payer: Self-pay | Admitting: Internal Medicine

## 2019-10-04 DIAGNOSIS — Z3169 Encounter for other general counseling and advice on procreation: Secondary | ICD-10-CM

## 2019-10-11 ENCOUNTER — Other Ambulatory Visit: Payer: Self-pay | Admitting: Internal Medicine

## 2019-10-11 MED ORDER — ALBUTEROL SULFATE HFA 108 (90 BASE) MCG/ACT IN AERS: 2.0000 | INHALATION_SPRAY | Freq: Four times a day (QID) | RESPIRATORY_TRACT | 0 refills | Status: DC | PRN

## 2019-10-12 MED ORDER — PROAIR HFA 108 (90 BASE) MCG/ACT IN AERS: 2.0000 | INHALATION_SPRAY | Freq: Four times a day (QID) | RESPIRATORY_TRACT | 0 refills | Status: DC | PRN

## 2019-10-16 ENCOUNTER — Encounter: Payer: Self-pay | Admitting: Neurology

## 2019-10-16 ENCOUNTER — Ambulatory Visit: Payer: Medicaid Other | Admitting: Neurology

## 2019-10-16 VITALS — BP 119/75 | HR 95 | Ht 66.0 in | Wt 287.0 lb

## 2019-10-16 DIAGNOSIS — G2581 Restless legs syndrome: Secondary | ICD-10-CM | POA: Diagnosis not present

## 2019-10-16 DIAGNOSIS — R5382 Chronic fatigue, unspecified: Secondary | ICD-10-CM | POA: Diagnosis not present

## 2019-10-16 DIAGNOSIS — M791 Myalgia, unspecified site: Secondary | ICD-10-CM

## 2019-10-16 DIAGNOSIS — Z6841 Body Mass Index (BMI) 40.0 and over, adult: Secondary | ICD-10-CM | POA: Diagnosis not present

## 2019-10-16 DIAGNOSIS — R519 Headache, unspecified: Secondary | ICD-10-CM

## 2019-10-16 DIAGNOSIS — G478 Other sleep disorders: Secondary | ICD-10-CM | POA: Diagnosis not present

## 2019-10-16 DIAGNOSIS — G8929 Other chronic pain: Secondary | ICD-10-CM | POA: Diagnosis not present

## 2019-10-16 NOTE — Addendum Note (Signed)
Addended by: Melvyn Novas on: 10/16/2019 03:38 PM   Modules accepted: Orders

## 2019-10-16 NOTE — Patient Instructions (Signed)
Pain Without a Known Cause Pain can occur in any part of the body and can range from mild to severe. Sometimes no cause can be found for why you are having pain. Some types of pain that can occur without a known cause include:  Headache.  Back pain.  Abdominal pain.  Neck pain. Your health care provider will do tests to try to find the cause of your pain. If no cause is found, your health care provider may diagnose you with pain without a known cause. In some cases, your health care provider may repeat tests and look further for a possible cause. Follow these instructions at home: Managing pain, stiffness, and swelling      Take over-the-counter and prescription medicines only as told by your health care provider.  Do not drive or use heavy machinery while taking prescription pain medicine.  Stop any activities that cause pain. Rest during periods of severe pain.  If directed, put ice on the painful area: ? Put ice in a plastic bag. ? Place a towel between your skin and the bag. ? Leave the ice on for 20 minutes, 2-3 times a day.  If directed, apply heat to the affected area. Use the heat source that your health care provider recommends, such as a moist heat pack or a heating pad. ? Place a towel between your skin and the heat source. ? Leave the heat on for 20-30 minutes. ? Remove the heat if your skin turns bright red. This is especially important if you are unable to feel pain, heat, or cold. You may have a greater risk of getting burned. General instructions   Reduce your stress with activities such as yoga or meditation. Talk with your health care provider about other ways to reduce stress.  Exercise regularly. Ask your health care provider what activities are safe for you.  Eat a balanced diet that includes fruits and vegetables, whole grains, lean meat, and low-fat dairy. Talk with your health care provider if you have any questions about your diet.  If you are taking  prescription pain medicine, take actions to prevent or treat constipation. Your health care provider may recommend that you: ? Drink enough fluid to keep your urine pale yellow. ? Eat foods that are high in fiber, such as fresh fruits and vegetables, whole grains, and beans. ? Limit foods that are high in fat and processed sugars, such as fried and sweet foods. ? Take an over-the-counter or prescription medicine for constipation. Contact a health care provider if you:  Have pain, and no reason can be found for it.  Do not get better, even after treatment. Get help right away if:  Your pain is making you want to harm yourself. If you ever feel like you may hurt yourself or others, or have thoughts about taking your own life, get help right away. You can go to your nearest emergency department or call:  Your local emergency services (911 in the [place]  A suicide crisis helpline, such as the [employer] at (229) 179-2724. This is open 24 hours a day. Summary  Pain can occur in any part of the body and can range from mild to severe.  Your health care provider will do tests to try to find the cause of your pain. If no cause is found, your health care provider may diagnose you with pain without a known cause.  To help your pain, take medicines as told by your health care provider,  apply ice or heat, exercise, reduce stress, and eat a healthy diet. This information is not intended to replace advice given to you by your health care provider. Make sure you discuss any questions you have with your health care provider. Document Revised: 02/23/2018 Document Reviewed: 01/17/2017 Elsevier Patient Education  2020 [employer].

## 2019-10-16 NOTE — Progress Notes (Signed)
SLEEP MEDICINE CLINIC    Provider:  Melvyn Novas, MD  Primary Care Physician:  Wanda Plump, MD [address] [place] [address]     Referring Provider: Wanda Plump, Md [address] [place],  [place] [address]          Chief Complaint according to patient   Patient presents with:    . New Patient (Initial Visit)     pt alone, rm 10. presents for several years she has had this pain all throughout her body. sharp stabbing pains. very sensitive to touch. there are pins and needle sensation that is in feet bilaterally. she also described having headaches daily. wakes up with headaches. she is taking tizanidine and excedrin migraine daily for headaches. the pains over the body also are daily. MRI of brain and lumbar were completed and negative. she has not had NCV/EMG (concerned whether would be able to tolerate)      Other Since wakes up daily with headaches, questioned if patient has ever had a SS and she has not. states that she unsure if snoring. she does mention waking up feeling exhausted and unsure if she gets into deep sleep.    Recent Visits with Me   None Other Visits in Neurology   None         HISTORY OF PRESENT ILLNESS:  Stephanie Davies is a 44 y.o. year old Other or two or more races female patient seen here as a referral on 10/16/2019 from Dr. Drue Novel for a sleep consultation.    Chief concern according to patient : Stephanie Davies is a 43 y.o. female patient of [place] descent, a married mother of a 54 year -old daughter , and seen here as a consultation requested by Dr. Drue Novel for an ongoing complaint of pain. Stephanie Davies remembers that she had a concern of pins-and-needles sensations pain throughout her body including headaches and allodynia even before her daughter was conceived.  She had difficulties with conceiving the first time and has been trying since to no avail.  She remembers that during the pregnancy she could hardly exercise or  walk and hips and lower pelvic area were described as very painful.  She has presented for several years now to different specialists, she remains very sensitive to touch, reports frequent sharp and stabbing pains and wakes up with headaches.  She spent 3 years in before returning to the [place].  There was no change of her pain situation in the different environment.  She had for a while lost 80 pounds only to gain the weight back but she did neither feel an improvement of her body pains nor her headaches but she achieved weight loss.  MRI of the brain and lumbar spine were completed and negative, she has not had a nerve conduction study EMG but is concerned whether she would be able to tolerate a needle test.  Dr. Drue Novel states that she is taking tizanidine and Excedrin Migraine almost daily for headaches and he questioned since she wakes up with headaches if the patient ever had a sleep study.  She did not.  She wakes up unrefreshed and restored but feels strongly that the pain is affecting her sleep quality as it does in daytime her life quality overall.   She  has a past medical history of Alopecia, Anal fistula, Anxiety and depression (03/07/2014), GERD (gastroesophageal reflux disease) (02/14/2014), Goiter (03/07/2014), Headache(784.0), Hemorrhoids, Kidney  stone, and Pseudotumor cerebri.   Pseudotumor cerebri was diagnosed in 2004 by Dr. Sandria Manly.  She was undergoing salpingoraphy in [place] and normal findings were reported.    Sleep relevant medical history: Nocturia 2 times, frequent vivid dreams, being chased or followed-  Family medical /sleep history: mother has daily headaches and wakes up headaches, HTN, DM- no insomnia,no apnea.   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   Tobacco use; none.  ETOH use; none , Caffeine intake in form of Coffee( 1 cup ). Excedrin migraine has  caffeine , takes it daily. Regular exercise - none    Sleep habits are as follows: The patient's dinner time is between 7-9 PM. The patient goes to bed at 10 PM and continues to sleep for 3-4 hours, wakes for 1-2 bathroom breaks,resumes sleep.    The preferred sleep position is on her sides , with the support of 2 pillows.memory foam. Has been treated for heart burn-  Dreams are reportedly very frequent/vivid.   6 AM is the usual rise time- daughter is now in first garde . The patient wakes up spontaneously by 5 AM .   She reports not feeling refreshed or restored in AM, with symptoms such as dry mouth, morning headaches - but never woken by HA, and residual fatigue.  Naps are taken infrequently..    Review of Systems: Out of a complete 14 system review, the patient complains of only the following symptoms, and all other reviewed systems are negative.:  Fatigue, no sleepiness , some snoring, pain related  fragmented sleep,  GED and pain cause sleep arousals. Especially in AM, waking up early.   No oral ulcers- not likely Behcets syndrome.   No recent thyroid testing.   Brain MRI    How likely are you to doze in the following situations: 0 = not likely, 1 = slight chance, 2 = moderate chance, 3 = high chance   Sitting and Reading? Watching Television? Sitting inactive in a public place (theater or meeting)? As a passenger in a car for an hour without a break? Lying down in the afternoon when circumstances permit? Sitting and talking to someone? Sitting quietly after lunch without alcohol? In a car, while stopped for a few minutes in traffic?   Total = 2/ 24 points   FSS endorsed at 60/ 63 points.   Social History   Socioeconomic History  . Marital status: Married    Spouse name: Not on file  . Number of children: 1  . Years of education: Not on file  . Highest education level: Not on file  Occupational History  . Occupation: stay home  Tobacco Use  . Smoking status: Never  Smoker  . Smokeless tobacco: Never Used  Substance and Sexual Activity  . Alcohol use: No  . Drug use: No  . Sexual activity: Not Currently    Birth control/protection: None  Other Topics Concern  . Not on file  Social History Narrative   Original from from [place]   Lives w/ mother, husband overseas Office manager)   Student-- [employer]   G1P1, daughter 24   Social Determinants of Health   Financial Resource Strain:   . Difficulty of Paying Living Expenses: Not on file  Food Insecurity:   . Worried About Programme researcher, broadcasting/film/video in the Last Year: Not on file  . Ran Out  of Food in the Last Year: Not on file  Transportation Needs:   . Lack of Transportation (Medical): Not on file  . Lack of Transportation (Non-Medical): Not on file  Physical Activity:   . Days of Exercise per Week: Not on file  . Minutes of Exercise per Session: Not on file  Stress:   . Feeling of Stress : Not on file  Social Connections:   . Frequency of Communication with Friends and Family: Not on file  . Frequency of Social Gatherings with Friends and Family: Not on file  . Attends Religious Services: Not on file  . Active Member of Clubs or Organizations: Not on file  . Attends BankerClub or Organization Meetings: Not on file  . Marital Status: Not on file    Family History  Problem Relation Age of Onset  . Diabetes Mother   . Hypertension Mother   . Esophageal cancer Maternal Grandmother   . Esophageal cancer Maternal Grandfather   . Diabetes Maternal Grandfather   . Colon cancer Neg Hx   . Colon polyps Neg Hx   . Gallbladder disease Neg Hx   . Kidney disease Neg Hx     Past Medical History:  Diagnosis Date  . Alopecia   . Anal fistula   . Anxiety and depression 03/07/2014  . GERD (gastroesophageal reflux disease) 02/14/2014  . Goiter 03/07/2014  . Headache(784.0)   . Hemorrhoids   . Kidney stone   . Pseudotumor cerebri     Past Surgical History:  Procedure Laterality Date  . CHOLECYSTECTOMY  2004   . [facility]       Current Outpatient Medications on File Prior to Visit  Medication Sig Dispense Refill  . aspirin-acetaminophen-caffeine (EXCEDRIN MIGRAINE) 250-250-65 MG tablet Take 2 tablets by mouth daily.    . hyoscyamine (LEVSIN) 0.125 MG tablet Take 1 tablet (0.125 mg total) by mouth every 4 (four) hours as needed. 30 tablet 0  . ketoconazole (NIZORAL) 2 % shampoo Apply 1 application topically 2 (two) times a week. 120 mL 3  . pantoprazole (PROTONIX) 40 MG tablet Take 1 tablet (40 mg total) by mouth daily before breakfast. 30 tablet 3  . PROAIR HFA 108 (90 Base) MCG/ACT inhaler Inhale 2 puffs into the lungs every 6 (six) hours as needed for wheezing or shortness of breath. 18 g 0  . sertraline (ZOLOFT) 50 MG tablet Take 50 mg by mouth daily.    Marland Kitchen. tiZANidine (ZANAFLEX) 4 MG tablet Take 4 mg by mouth daily as needed for muscle spasms.     No current facility-administered medications on file prior to visit.   Physical exam:  Today's Vitals   10/16/19 1414  BP: 119/75  Pulse: 95  Weight: 287 lb (130.2 kg)  Height: 5\' 6"  (1.676 m)   Body mass index is 46.32 kg/m.   Wt Readings from Last 3 Encounters:  10/16/19 287 lb (130.2 kg)  09/27/19 285 lb (129.3 kg)  08/29/19 285 lb 4 oz (129.4 kg)     Ht Readings from Last 3 Encounters:  10/16/19 5\' 6"  (1.676 m)  09/27/19 5\' 6"  (1.676 m)  08/29/19 5\' 6"  (1.676 m)      General: The patient is awake, alert and appears not in acute distress. The patient is well groomed. Head: Normocephalic, atraumatic. Neck is supple. Mallampati  2 ,   neck circumference: 18 inches .  Nasal airflow frequently congested - clear fluid, runny nose. Every morning runny nose. .Marland Kitchen  Retrognathia is not  seen.  Dental status: intact  Cardiovascular:  Regular rate and cardiac rhythm by pulse,  without distended neck veins. Respiratory: Lungs are clear to auscultation.  Skin:  Without evidence of ankle edema, or rash. Trunk: The  patient's posture is erect.   Neurologic exam : The patient is awake and alert, oriented to place and time.   Memory subjective described as intact.  Attention span & concentration ability appears normal.  Speech is fluent,  without  dysarthria, dysphonia or aphasia.  Mood and affect are appropriate.   Cranial nerves: no loss of smell or taste reported  Pupils are equal and briskly reactive to light. Funduscopic exam deferred.   Extraocular movements in vertical and horizontal planes were intact and without nystagmus. No Diplopia. Visual fields by finger perimetry are intact. Hearing was intact to soft voice and finger rubbing.   Facial sensation intact to fine touch.  Facial motor strength is symmetric and tongue and uvula move midline.  Neck ROM : rotation, tilt and flexion extension were normal for age and shoulder shrug was symmetrical.    Motor exam:  Symmetric bulk, tone and ROM.   Normal tone without cog wheeling, symmetric grip strength .   Sensory:  Fine touch, pinprick and vibration were tested  and  normal.  Proprioception tested in the upper extremities was normal.   Coordination: Rapid alternating movements in the fingers/hands were of normal speed.  The Finger-to-nose maneuver was intact without evidence of ataxia, dysmetria or tremor.   Gait and station: Patient could rise unassisted from a seated position, walked without assistive device.  Stance is of normal width/ base. Toe and heel walk were deferred.  Deep tendon reflexes: in the  upper and lower extremities are symmetric and intact.  Babinski response was normal.       After spending a total time of 60 minutes face to face and preparation  time for physical and neurologic examination, review of laboratory studies,  personal review of imaging studies, reports and results of other testing and review of referral information / records as far as provided in visit, I have established the following assessments:     1) in the context of working this patient up for morning headaches I would be happy to do either an attended sleep study for a home sleep test. Evaluate and screen her for the presence of sleep apnea, of hypoxia which can promote morning headaches and fatigue.  There is no real evidence of sleepiness the patient can control Stephanie urges to go to sleep when she does not take naps.  The main problem seems to be fatigue, achiness soreness sometimes sharp sometimes stabbing pain sometimes burning pain as well as daily headaches.  I would like for her to see a rheumatologist but first we will do our neurological work-up.  I get home sleep test I would be happy to order lab tests that are associated with myalgia , fatigue and headaches, and infertility:  rheumatological or autoimmune diseases if those have not been done.    My Plan is to proceed with:  1) HST  2) NCV - may not tolerate EMG-  3) labs- all  Neuropathy and rheumatological screening labs.   I would like to thank Wanda Plump, MD and Wanda Plump, [facility] [address] [place],  [place] [address] for allowing me to meet with and to take care of this pleasant patient.   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  .  Electronically signed by: Melvyn Novas, MD 10/16/2019 2:38 PM  [facility] Board certified by The [employer] of Sleep Medicine and Diplomate of the [employer] of Sleep Medicine. Board certified In Neurology through the ABPN, Fellow of the [employer] of Neurology. Medical Director of [facility].

## 2019-10-17 ENCOUNTER — Telehealth: Payer: Self-pay | Admitting: Neurology

## 2019-10-17 NOTE — Telephone Encounter (Signed)
mcd healthy blue pending  

## 2019-10-22 NOTE — Telephone Encounter (Signed)
I checked the status on the portal it is still pending.  

## 2019-10-24 ENCOUNTER — Encounter: Payer: Medicaid Other | Admitting: Internal Medicine

## 2019-10-25 ENCOUNTER — Encounter: Payer: Self-pay | Admitting: Internal Medicine

## 2019-10-25 ENCOUNTER — Other Ambulatory Visit: Payer: Self-pay

## 2019-10-25 ENCOUNTER — Ambulatory Visit: Payer: Medicaid Other | Admitting: Internal Medicine

## 2019-10-25 VITALS — BP 130/79 | HR 70 | Temp 98.1°F | Resp 16 | Ht 66.0 in | Wt 282.1 lb

## 2019-10-25 DIAGNOSIS — F419 Anxiety disorder, unspecified: Secondary | ICD-10-CM

## 2019-10-25 DIAGNOSIS — F32A Depression, unspecified: Secondary | ICD-10-CM | POA: Diagnosis not present

## 2019-10-25 DIAGNOSIS — F410 Panic disorder [episodic paroxysmal anxiety] without agoraphobia: Secondary | ICD-10-CM | POA: Diagnosis not present

## 2019-10-25 MED ORDER — HYDROXYZINE HCL 10 MG PO TABS: 10.0000 mg | ORAL_TABLET | Freq: Two times a day (BID) | ORAL | 0 refills | Status: DC | PRN

## 2019-10-25 MED ORDER — SERTRALINE HCL 100 MG PO TABS: ORAL_TABLET | ORAL | 1 refills | Status: DC

## 2019-10-25 NOTE — Progress Notes (Signed)
Pre visit review using our clinic review tool, if applicable. No additional management support is needed unless otherwise documented below in the visit note. 

## 2019-10-25 NOTE — Patient Instructions (Addendum)
Take sertraline 100 mg: 1 tablet daily for 2 weeks, then increase to 1.5 tablets every day  For panic attacks, take hydroxyzine 1 or 2 tablets up to twice a day only if you feel panicky. He will call us drowsiness, be careful.   GO TO THE FRONT DESK, PLEASE SCHEDULE YOUR APPOINTMENTS Come back for a checkup in 2 months

## 2019-10-25 NOTE — Progress Notes (Signed)
Subjective:    Patient ID: Stephanie Davies, female    DOB: 10-Mar-1975, 44 y.o.   MRN: [id number]  DOS:  10/25/2019 Type of visit - description: Acute The patient was prescribed sertraline by a psychiatrist while she was living in [place]. She started to decrease the dose from 150 mg daily to 50 mg daily in the last few months. Throughout this time she has remained mildly anxious. For the last 4 days she is having  panic attacks described as fast breathing, palpitations, dizziness. These episodes are similar to previous panic attacks.   Review of Systems Denies chest pain, fainting, lower extremity edema. No depression, no suicidal ideas  Past Medical History:  Diagnosis Date  . Alopecia   . Anal fistula   . Anxiety and depression 03/07/2014  . GERD (gastroesophageal reflux disease) 02/14/2014  . Goiter 03/07/2014  . Headache(784.0)   . Hemorrhoids   . Kidney stone   . Pseudotumor cerebri     Past Surgical History:  Procedure Laterality Date  . CHOLECYSTECTOMY  2004  . kidney stent: 12/2018 at [facility]      Allergies as of 10/25/2019   No Known Allergies     Medication List       Accurate as of October 25, 2019 11:59 PM. If you have any questions, ask your nurse or doctor.        aspirin-acetaminophen-caffeine 250-250-65 MG tablet Commonly known as: EXCEDRIN MIGRAINE Take 2 tablets by mouth daily.   hydrOXYzine 10 MG tablet Commonly known as: ATARAX/VISTARIL Take 1-2 tablets (10-20 mg total) by mouth 2 (two) times daily as needed (anxiety). Started by: Willow Ora, MD   hyoscyamine 0.125 MG tablet Commonly known as: LEVSIN Take 1 tablet (0.125 mg total) by mouth every 4 (four) hours as needed.   ketoconazole 2 % shampoo Commonly known as: NIZORAL Apply 1 application topically 2 (two) times a week.   pantoprazole 40 MG tablet Commonly known as: PROTONIX Take 1 tablet (40 mg total) by mouth daily before breakfast.   ProAir HFA 108 (90 Base) MCG/ACT  inhaler Generic drug: albuterol Inhale 2 puffs into the lungs every 6 (six) hours as needed for wheezing or shortness of breath.   sertraline 100 MG tablet Commonly known as: Zoloft 1 tablet daily for 2 weeks, then 1.5 tablets daily What changed:   medication strength  how much to take  how to take this  when to take this  additional instructions Changed by: Willow Ora, MD   tiZANidine 4 MG tablet Commonly known as: ZANAFLEX Take 4 mg by mouth daily as needed for muscle spasms.          Objective:   Physical Exam BP 130/79 (BP Location: Right Arm, Patient Position: Sitting, Cuff Size: Normal)   Pulse 70   Temp 98.1 F (36.7 C) (Oral)   Resp 16   Ht 5\' 6"  (1.676 m)   Wt 282 lb 2 oz (128 kg)   LMP 10/17/2019 (Exact Date)   SpO2 95%   BMI 45.54 kg/m  General:   Well developed, NAD, BMI noted. HEENT:  Normocephalic . Face symmetric, atraumatic Lungs:  CTA B Normal respiratory effort, no intercostal retractions, no accessory muscle use. Heart: RRR,  no murmur.  Lower extremities: no pretibial edema bilaterally  Skin: Not pale. Not jaundice Neurologic:  alert & oriented X3.  Speech normal, gait appropriate for age and unassisted Psych--  Cognition and judgment appear intact.  Cooperative with normal attention span and concentration.  Behavior appropriate. Slightly anxious appearing but no depressed appearing.      Assessment      Assessment Anxiety, depression pseudotumor cerebri, headaches Goiter : [place] 03-2014, bx  Done 11-2015  IBS Kidney stones, kidney stent R 12/2018 renal cysts found on a renal ultrasound in [place] Goiter: Biopsy 08/23/2019, [facility] category 2 benign   PLAN: Anxiety depression: We have not assessed this issue since 2018. She was seen by a psychiatrist while living in [place] last year, sxs controlled on sertraline 150 mg qd, in the last 4 to 5 months she  decreasede dose to 50 mg. Throughout this time the anxiety was mild but now  has developed panic attack as described above. Last TSH normal, since attacks are typical for her, would not pursue cardiac evaluation for palpitations at this point. She did mention that all her life she has episodes when she feels very happy and very sad,   never dx w/  bipolar but we will keep that in mind. Plan: Gradual increase sertraline from 50 mg to 150 mg daily. Call if not gradually improving Reassess in 2 months Request something for panic attacks, on chart review she has try Xanax and propranolol and they did not work too well for her.  The last medication I prescribed was Atarax, will try that again. Myalgias, OSA? Saw neurology 10/16/2019 with myalgias, morning headache and nonrestorative sleep. They recommended a home sleep study, NCV, and labs for a work-up of neuropathy. They also rec rheumatology evaluation at some point for myalgias  RTC 2 months   Time spent 32 minutes mostly counseling about her anxiety treatment options and reviewing the chart  This visit occurred during the SARS-CoV-2 public health emergency.  Safety protocols were in place, including screening questions prior to the visit, additional usage of staff PPE, and extensive cleaning of exam room while observing appropriate contact time as indicated for disinfecting solutions.

## 2019-10-27 NOTE — Assessment & Plan Note (Signed)
Anxiety depression: We have not assessed this issue since 2018. She was seen by a psychiatrist while living in [place] last year, sxs controlled on sertraline 150 mg qd, in the last 4 to 5 months she  decreasede dose to 50 mg. Throughout this time the anxiety was mild but now has developed panic attack as described above. Last TSH normal, since attacks are typical for her, would not pursue cardiac evaluation for palpitations at this point. She did mention that all her life she has episodes when she feels very happy and very sad,   never dx w/  bipolar but we will keep that in mind. Plan: Gradual increase sertraline from 50 mg to 150 mg daily. Call if not gradually improving Reassess in 2 months Request something for panic attacks, on chart review she has try Xanax and propranolol and they did not work too well for her.  The last medication I prescribed was Atarax, will try that again. Myalgias, OSA? Saw neurology 10/16/2019 with myalgias, morning headache and nonrestorative sleep. They recommended a home sleep study, NCV, and labs for a work-up of neuropathy. They also rec rheumatology evaluation at some point for myalgias  RTC 2 months

## 2019-10-29 NOTE — Telephone Encounter (Signed)
MCD healthy blue Berkley Harvey: [id number] (exp. 10/17/19 to 11/17/19) patient is scheduled at GI for 11/07/19.

## 2019-10-31 ENCOUNTER — Ambulatory Visit (INDEPENDENT_AMBULATORY_CARE_PROVIDER_SITE_OTHER): Payer: Medicaid Other | Admitting: Neurology

## 2019-10-31 DIAGNOSIS — Z6841 Body Mass Index (BMI) 40.0 and over, adult: Secondary | ICD-10-CM

## 2019-10-31 DIAGNOSIS — M791 Myalgia, unspecified site: Secondary | ICD-10-CM

## 2019-10-31 DIAGNOSIS — G4733 Obstructive sleep apnea (adult) (pediatric): Secondary | ICD-10-CM

## 2019-10-31 DIAGNOSIS — G8929 Other chronic pain: Secondary | ICD-10-CM

## 2019-10-31 DIAGNOSIS — G478 Other sleep disorders: Secondary | ICD-10-CM

## 2019-10-31 DIAGNOSIS — G2581 Restless legs syndrome: Secondary | ICD-10-CM

## 2019-10-31 DIAGNOSIS — R519 Headache, unspecified: Secondary | ICD-10-CM

## 2019-10-31 DIAGNOSIS — R5382 Chronic fatigue, unspecified: Secondary | ICD-10-CM

## 2019-11-03 ENCOUNTER — Other Ambulatory Visit: Payer: Self-pay

## 2019-11-03 ENCOUNTER — Encounter (HOSPITAL_COMMUNITY): Payer: Self-pay | Admitting: Emergency Medicine

## 2019-11-03 ENCOUNTER — Observation Stay (HOSPITAL_COMMUNITY)
Admission: EM | Admit: 2019-11-03 | Discharge: 2019-11-05 | Disposition: A | Discharge status: Home / Self Care | Payer: Medicaid Other | Attending: Physician Assistant | Admitting: Physician Assistant

## 2019-11-03 ENCOUNTER — Emergency Department (HOSPITAL_COMMUNITY): Payer: Medicaid Other

## 2019-11-03 DIAGNOSIS — Z20822 Contact with and (suspected) exposure to covid-19: Secondary | ICD-10-CM | POA: Diagnosis not present

## 2019-11-03 DIAGNOSIS — Z7982 Long term (current) use of aspirin: Secondary | ICD-10-CM | POA: Insufficient documentation

## 2019-11-03 DIAGNOSIS — R112 Nausea with vomiting, unspecified: Secondary | ICD-10-CM | POA: Diagnosis not present

## 2019-11-03 DIAGNOSIS — R109 Unspecified abdominal pain: Secondary | ICD-10-CM | POA: Diagnosis not present

## 2019-11-03 DIAGNOSIS — R1013 Epigastric pain: Secondary | ICD-10-CM | POA: Diagnosis not present

## 2019-11-03 DIAGNOSIS — K3580 Unspecified acute appendicitis: Principal | ICD-10-CM | POA: Diagnosis present

## 2019-11-03 LAB — CBC
HCT: 40.4 % (ref 36.0–46.0)
Hemoglobin: 13.2 g/dL (ref 12.0–15.0)
MCH: 26.5 pg (ref 26.0–34.0)
MCHC: 32.7 g/dL (ref 30.0–36.0)
MCV: 81 fL (ref 80.0–100.0)
Platelets: 443 10*3/uL — ABNORMAL HIGH (ref 150–400)
RBC: 4.99 MIL/uL (ref 3.87–5.11)
RDW: 15.7 % — ABNORMAL HIGH (ref 11.5–15.5)
WBC: 19.5 10*3/uL — ABNORMAL HIGH (ref 4.0–10.5)
nRBC: 0 % (ref 0.0–0.2)

## 2019-11-03 LAB — URINALYSIS, ROUTINE W REFLEX MICROSCOPIC
Bilirubin Urine: NEGATIVE
Glucose, UA: NEGATIVE mg/dL
Hgb urine dipstick: NEGATIVE
Ketones, ur: NEGATIVE mg/dL
Leukocytes,Ua: NEGATIVE
Nitrite: NEGATIVE
Protein, ur: NEGATIVE mg/dL
Specific Gravity, Urine: 1.035 — ABNORMAL HIGH (ref 1.005–1.030)
pH: 7 (ref 5.0–8.0)

## 2019-11-03 LAB — COMPREHENSIVE METABOLIC PANEL
ALT: 32 U/L (ref 0–44)
AST: 23 U/L (ref 15–41)
Albumin: 3.8 g/dL (ref 3.5–5.0)
Alkaline Phosphatase: 105 U/L (ref 38–126)
Anion gap: 10 (ref 5–15)
BUN: 9 mg/dL (ref 6–20)
CO2: 18 mmol/L — ABNORMAL LOW (ref 22–32)
Calcium: 8.9 mg/dL (ref 8.9–10.3)
Chloride: 107 mmol/L (ref 98–111)
Creatinine, Ser: 0.79 mg/dL (ref 0.44–1.00)
GFR, Estimated: >60 mL/min (ref >60)
Glucose, Bld: 116 mg/dL — ABNORMAL HIGH (ref 70–99)
Potassium: 4 mmol/L (ref 3.5–5.1)
Sodium: 135 mmol/L (ref 135–145)
Total Bilirubin: 0.4 mg/dL (ref 0.3–1.2)
Total Protein: 7.6 g/dL (ref 6.5–8.1)

## 2019-11-03 LAB — I-STAT BETA HCG BLOOD, ED (MC, WL, AP ONLY): I-stat hCG, quantitative: <5 m[IU]/mL (ref <5)

## 2019-11-03 LAB — LIPASE, BLOOD: Lipase: 21 U/L (ref 11–51)

## 2019-11-03 MED ORDER — ONDANSETRON 4 MG PO TBDP: 4.0000 mg | ORAL_TABLET | Freq: Four times a day (QID) | ORAL | Status: DC | PRN

## 2019-11-03 MED ORDER — ACETAMINOPHEN 325 MG PO TABS
650.0000 mg | ORAL_TABLET | Freq: Four times a day (QID) | ORAL | Status: DC | PRN
  Administered 2019-11-04: 650 mg via ORAL
  Filled 2019-11-03: qty 2

## 2019-11-03 MED ORDER — METRONIDAZOLE IN NACL 5-0.79 MG/ML-% IV SOLN
500.0000 mg | Freq: Once | INTRAVENOUS | Status: AC
  Administered 2019-11-03: 500 mg via INTRAVENOUS
  Filled 2019-11-03: qty 100

## 2019-11-03 MED ORDER — METRONIDAZOLE IN NACL 5-0.79 MG/ML-% IV SOLN: 500.0000 mg | Freq: Three times a day (TID) | INTRAVENOUS | Status: DC

## 2019-11-03 MED ORDER — SODIUM CHLORIDE 0.9 % IV BOLUS
1000.0000 mL | Freq: Once | INTRAVENOUS | Status: AC
  Administered 2019-11-03: 1000 mL via INTRAVENOUS

## 2019-11-03 MED ORDER — HYDROMORPHONE HCL 1 MG/ML IJ SOLN
1.0000 mg | Freq: Once | INTRAMUSCULAR | Status: AC
  Administered 2019-11-03: 1 mg via INTRAVENOUS
  Filled 2019-11-03: qty 1

## 2019-11-03 MED ORDER — OXYCODONE HCL 5 MG PO TABS
5.0000 mg | ORAL_TABLET | ORAL | Status: DC | PRN
  Administered 2019-11-04 – 2019-11-05 (×2): 5 mg via ORAL
  Filled 2019-11-03 (×2): qty 1

## 2019-11-03 MED ORDER — SODIUM CHLORIDE 0.9 % IV SOLN: 2.0000 g | INTRAVENOUS | Status: DC

## 2019-11-03 MED ORDER — SODIUM CHLORIDE 0.9 % IV SOLN
2.0000 g | Freq: Once | INTRAVENOUS | Status: AC
  Administered 2019-11-03: 2 g via INTRAVENOUS
  Filled 2019-11-03: qty 20

## 2019-11-03 MED ORDER — ONDANSETRON HCL 4 MG/2ML IJ SOLN: 4.0000 mg | Freq: Four times a day (QID) | INTRAMUSCULAR | Status: DC | PRN

## 2019-11-03 MED ORDER — ACETAMINOPHEN 650 MG RE SUPP: 650.0000 mg | Freq: Four times a day (QID) | RECTAL | Status: DC | PRN

## 2019-11-03 MED ORDER — PANTOPRAZOLE SODIUM 40 MG IV SOLR
40.0000 mg | Freq: Once | INTRAVENOUS | Status: AC
  Administered 2019-11-03: 40 mg via INTRAVENOUS
  Filled 2019-11-03: qty 40

## 2019-11-03 MED ORDER — METOCLOPRAMIDE HCL 5 MG/ML IJ SOLN
5.0000 mg | Freq: Once | INTRAMUSCULAR | Status: AC
  Administered 2019-11-03: 5 mg via INTRAVENOUS
  Filled 2019-11-03: qty 2

## 2019-11-03 MED ORDER — HYDROMORPHONE HCL 1 MG/ML IJ SOLN
1.0000 mg | INTRAMUSCULAR | Status: DC | PRN
  Administered 2019-11-04 (×2): 1 mg via INTRAVENOUS
  Filled 2019-11-03 (×2): qty 1

## 2019-11-03 MED ORDER — SODIUM CHLORIDE 0.9 % IV SOLN: INTRAVENOUS | Status: DC

## 2019-11-03 MED ORDER — IOHEXOL 300 MG/ML  SOLN
100.0000 mL | Freq: Once | INTRAMUSCULAR | Status: AC | PRN
  Administered 2019-11-03: 100 mL via INTRAVENOUS

## 2019-11-03 MED ORDER — SODIUM CHLORIDE (PF) 0.9 % IJ SOLN
INTRAMUSCULAR | Status: AC
  Filled 2019-11-03: qty 50

## 2019-11-03 MED ORDER — PANTOPRAZOLE SODIUM 40 MG IV SOLR
40.0000 mg | Freq: Every day | INTRAVENOUS | Status: DC
  Administered 2019-11-04: 40 mg via INTRAVENOUS
  Filled 2019-11-03: qty 40

## 2019-11-03 MED ORDER — SODIUM CHLORIDE 0.45 % IV SOLN: INTRAVENOUS | Status: DC

## 2019-11-03 NOTE — ED Triage Notes (Signed)
Patient c/o abdominal pain with N/V/D today. Hx peptic ulcer.

## 2019-11-03 NOTE — ED Provider Notes (Signed)
[facility]-EMERGENCY DEPT Provider Note   CSN: [id number] Arrival date & time: 11/03/19  1927     History Chief Complaint  Patient presents with  . Abdominal Pain  . Emesis    Stephanie Davies is a 44 y.o. female.  44 year old female presents with epigastric pain x24 hours.  She is status post cholecystectomy and does have a history of known ulcer disease.  Has had emesis but there has been no blood.  Denies any dark stools.  Seen by physician recently for this and prescribed H2 blockers but has not filled them yet.  Notes some weakness.  Denies any urinary symptoms.        Past Medical History:  Diagnosis Date  . Alopecia   . Anal fistula   . Anxiety and depression 03/07/2014  . GERD (gastroesophageal reflux disease) 02/14/2014  . Goiter 03/07/2014  . Headache(784.0)   . Hemorrhoids   . Kidney stone   . Pseudotumor cerebri     Patient Active Problem List   Diagnosis Date Noted  . IBS (irritable bowel syndrome) 07/23/2019  . History of kidney stones 07/23/2019  . Pseudotumor cerebri   . Anxiety 05/13/2016  . Cough 04/11/2015  . PCP NOTES >>>>>>>>>>>>>>>>>>>>>>>>>>>>>>>>>> 09/20/2014  . Anxiety and depression 03/07/2014  . Goiter 03/07/2014  . Hives 02/20/2014  . GERD (gastroesophageal reflux disease) 02/14/2014  . Upper airway cough syndrome 01/22/2014  . Eczema, allergic 01/09/2014  . Dyspnea 01/09/2014  . Hip pain 10/15/2013  . Pain in joint, pelvic region and thigh 10/08/2013  . External bleeding hemorrhoids 10/08/2013  . Rh negative status during pregnancy 06/06/2013    Past Surgical History:  Procedure Laterality Date  . CHOLECYSTECTOMY  2004  . kidney stent: 12/2018 at [facility]       OB History    Gravida  1   Para  1   Term  1   Preterm      AB      Living  1     SAB      TAB      Ectopic      Multiple      Live Births  1           Family History  Problem Relation Age of Onset  . Diabetes Mother   .  Hypertension Mother   . Esophageal cancer Maternal Grandmother   . Esophageal cancer Maternal Grandfather   . Diabetes Maternal Grandfather   . Colon cancer Neg Hx   . Colon polyps Neg Hx   . Gallbladder disease Neg Hx   . Kidney disease Neg Hx     Social History   Tobacco Use  . Smoking status: Never Smoker  . Smokeless tobacco: Never Used  Substance Use Topics  . Alcohol use: No  . Drug use: No    Home Medications Prior to Admission medications   Medication Sig Start Date End Date Taking? Authorizing Provider  aspirin-acetaminophen-caffeine (EXCEDRIN MIGRAINE) [phone] MG tablet Take 2 tablets by mouth daily.    [provider]  hydrOXYzine (ATARAX/VISTARIL) 10 MG tablet Take 1-2 tablets (10-20 mg total) by mouth 2 (two) times daily as needed (anxiety). 10/25/19   Wanda Plump, MD  hyoscyamine (LEVSIN) 0.125 MG tablet Take 1 tablet (0.125 mg total) by mouth every 4 (four) hours as needed. 08/10/19   Wanda Plump, MD  ketoconazole (NIZORAL) 2 % shampoo Apply 1 application topically 2 (two) times a week. 08/13/19   Paz,  Nolon Rod, MD  pantoprazole (PROTONIX) 40 MG tablet Take 1 tablet (40 mg total) by mouth daily before breakfast. 08/29/19   Wanda Plump, MD  [facility] HFA 108 [phone] Base) MCG/ACT inhaler Inhale 2 puffs into the lungs every 6 (six) hours as needed for wheezing or shortness of breath. 10/12/19   Wanda Plump, MD  sertraline (ZOLOFT) 100 MG tablet 1 tablet daily for 2 weeks, then 1.5 tablets daily 10/25/19   Wanda Plump, MD  tiZANidine (ZANAFLEX) 4 MG tablet Take 4 mg by mouth daily as needed for muscle spasms.    [provider]    Allergies    Patient has no known allergies.  Review of Systems   Review of Systems  All other systems reviewed and are negative.   Physical Exam Updated Vital Signs BP 138/89 (BP Location: Left Arm)   Pulse 87   Temp 98.3 F (36.8 C) (Oral)   Resp 15   Ht 1.676 m (5\' 6" )   Wt 127.9 kg   LMP 10/17/2019 (Exact Date)    SpO2 99%   BMI 45.52 kg/m   Physical Exam Vitals and nursing note reviewed.  Constitutional:      General: She is not in acute distress.    Appearance: Normal appearance. She is well-developed. She is not toxic-appearing.  HENT:     Head: Normocephalic and atraumatic.  Eyes:     General: Lids are normal.     Conjunctiva/sclera: Conjunctivae normal.     Pupils: Pupils are equal, round, and reactive to light.  Neck:     Thyroid: No thyroid mass.     Trachea: No tracheal deviation.  Cardiovascular:     Rate and Rhythm: Normal rate and regular rhythm.     Heart sounds: Normal heart sounds. No murmur heard.  No gallop.   Pulmonary:     Effort: Pulmonary effort is normal. No respiratory distress.     Breath sounds: Normal breath sounds. No stridor. No decreased breath sounds, wheezing, rhonchi or rales.  Abdominal:     General: Bowel sounds are normal. There is no distension.     Palpations: Abdomen is soft.     Tenderness: There is abdominal tenderness in the epigastric area. There is guarding. There is no rebound.    Musculoskeletal:        General: No tenderness. Normal range of motion.     Cervical back: Normal range of motion and neck supple.  Skin:    General: Skin is warm and dry.     Findings: No abrasion or rash.  Neurological:     Mental Status: She is alert and oriented to person, place, and time.     GCS: GCS eye subscore is 4. GCS verbal subscore is 5. GCS motor subscore is 6.     Cranial Nerves: No cranial nerve deficit.     Sensory: No sensory deficit.  Psychiatric:        Speech: Speech normal.        Behavior: Behavior normal.     ED Results / Procedures / Treatments   Labs (all labs ordered are listed, but only abnormal results are displayed) Labs Reviewed  LIPASE, BLOOD  COMPREHENSIVE METABOLIC PANEL  CBC  URINALYSIS, ROUTINE W REFLEX MICROSCOPIC  I-STAT BETA HCG BLOOD, ED ([facility], [facility], [facility] ONLY)    EKG None  Radiology No results  found.  Procedures Procedures (including critical care time)  Medications Ordered in ED Medications  HYDROmorphone (DILAUDID) injection  1 mg (has no administration in time range)  metoCLOPramide (REGLAN) injection 5 mg (has no administration in time range)  sodium chloride 0.9 % bolus 1,000 mL (has no administration in time range)  0.9 %  sodium chloride infusion (has no administration in time range)    ED Course  I have reviewed the triage vital signs and the nursing notes.  Pertinent labs & imaging results that were available during my care of the patient were reviewed by me and considered in my medical decision making (see chart for details).    MDM Rules/Calculators/A&P                          Patient medicated for pain and feels better.  Patient was significant leukocytosis of 19.5 thousand.  Abdominal CT consistent with mild appendicitis.  Patient started on IV antibiotics.  Discussed with Dr. Mayme Genta from general surgery who will come see the patient Final Clinical Impression(s) / ED Diagnoses Final diagnoses:  None    Rx / DC Orders ED Discharge Orders    None       Lorre Nick, MD 11/03/19 2243

## 2019-11-03 NOTE — H&P (Signed)
Stephanie Davies is an 44 y.o. female.    General Surgery [facility]  Chief Complaint: abdominal pain, acute appendicitis  HPI: Patient is a 44 year old female who presents to the emergency department with less than 24-hour history of abdominal pain.  Patient notes onset today of study sharp burning epigastric abdominal pain.  This is been associated with nausea and emesis.  Patient has also had diarrhea.  Patient presented to the emergency room where laboratory studies showed an elevated white blood cell count of 19.5.  Patient subsequently underwent CT scan abdomen and pelvis with findings of early acute appendicitis.  General surgery is called to evaluate and manage.  Previous abdominal surgery includes cholecystectomy.  Patient has a history of anxiety for which she is on medication.  Her primary care physician is Dr. Willow Ora.  Past Medical History:  Diagnosis Date  . Alopecia   . Anal fistula   . Anxiety and depression 03/07/2014  . GERD (gastroesophageal reflux disease) 02/14/2014  . Goiter 03/07/2014  . Headache(784.0)   . Hemorrhoids   . Kidney stone   . Pseudotumor cerebri     Past Surgical History:  Procedure Laterality Date  . CHOLECYSTECTOMY  2004  . kidney stent: 12/2018 at [facility] History  Problem Relation Age of Onset  . Diabetes Mother   . Hypertension Mother   . Esophageal cancer Maternal Grandmother   . Esophageal cancer Maternal Grandfather   . Diabetes Maternal Grandfather   . Colon cancer Neg Hx   . Colon polyps Neg Hx   . Gallbladder disease Neg Hx   . Kidney disease Neg Hx    Social History:  reports that she has never smoked. She has never used smokeless tobacco. She reports that she does not drink alcohol and does not use drugs.  Allergies: No Known Allergies  (Not in a hospital admission)   Results for orders placed or performed during the hospital encounter of 11/03/19 (from the past 48 hour(s))  Lipase, blood      Status: None   Collection Time: 11/03/19  7:45 PM  Result Value Ref Range   Lipase 21 11 - 51 U/L    Comment: Performed at [facility], [address], [place], [place] [address]  Comprehensive metabolic panel     Status: Abnormal   Collection Time: 11/03/19  7:45 PM  Result Value Ref Range   Sodium 135 135 - 145 mmol/L   Potassium 4.0 3.5 - 5.1 mmol/L   Chloride 107 98 - 111 mmol/L   CO2 18 (L) 22 - 32 mmol/L   Glucose, Bld 116 (H) 70 - 99 mg/dL    Comment: Glucose reference range applies only to samples taken after fasting for at least 8 hours.   BUN 9 6 - 20 mg/dL   Creatinine, Ser 8.14 0.44 - 1.00 mg/dL   Calcium 8.9 8.9 - 48.1 mg/dL   Total Protein 7.6 6.5 - 8.1 g/dL   Albumin 3.8 3.5 - 5.0 g/dL   AST 23 15 - 41 U/L   ALT 32 0 - 44 U/L   Alkaline Phosphatase 105 38 - 126 U/L   Total Bilirubin 0.4 0.3 - 1.2 mg/dL   GFR, Estimated >85 >63 mL/min    Comment: (NOTE) Calculated using the CKD-EPI Creatinine Equation (2021)    Anion gap 10 5 - 15    Comment: Performed at [facility], [address] [place],  [place] [address]  CBC     Status: Abnormal   Collection Time: 11/03/19  7:45 PM  Result Value Ref Range   WBC 19.5 (H) 4.0 - 10.5 K/uL   RBC 4.99 3.87 - 5.11 MIL/uL   Hemoglobin 13.2 12.0 - 15.0 g/dL   HCT 67.6 36 - 46 %   MCV 81.0 80.0 - 100.0 fL   MCH 26.5 26.0 - 34.0 pg   MCHC 32.7 30.0 - 36.0 g/dL   RDW 72.0 (H) 94.7 - 09.6 %   Platelets 443 (H) 150 - 400 K/uL   nRBC 0.0 0.0 - 0.2 %    Comment: Performed at [facility], [address], [place], [place] [address]  I-Stat beta hCG blood, ED     Status: None   Collection Time: 11/03/19  8:03 PM  Result Value Ref Range   I-stat hCG, quantitative <5.0 <5 mIU/mL   Comment 3            Comment:   GEST. AGE      CONC.  (mIU/mL)   <=1 WEEK        5 - 50     2 WEEKS       50 - 500     3 WEEKS       100 - 10,000     4 WEEKS     1,000 - 30,000        FEMALE AND  NON-PREGNANT FEMALE:     LESS THAN 5 mIU/mL   Urinalysis, Routine w reflex microscopic Urine, Clean Catch     Status: Abnormal   Collection Time: 11/03/19 10:30 PM  Result Value Ref Range   Color, Urine STRAW (A) YELLOW   APPearance CLEAR CLEAR   Specific Gravity, Urine 1.035 (H) 1.005 - 1.030   pH 7.0 5.0 - 8.0   Glucose, UA NEGATIVE NEGATIVE mg/dL   Hgb urine dipstick NEGATIVE NEGATIVE   Bilirubin Urine NEGATIVE NEGATIVE   Ketones, ur NEGATIVE NEGATIVE mg/dL   Protein, ur NEGATIVE NEGATIVE mg/dL   Nitrite NEGATIVE NEGATIVE   Leukocytes,Ua NEGATIVE NEGATIVE    Comment: Performed at [facility], [address] [place], [place] [address]   CT Abdomen Pelvis W Contrast  Result Date: 11/03/2019 CLINICAL DATA:  Abdominal pain and distension. EXAM: CT ABDOMEN AND PELVIS WITH CONTRAST TECHNIQUE: Multidetector CT imaging of the abdomen and pelvis was performed using the standard protocol following bolus administration of intravenous contrast. CONTRAST:  OMNIPAQUE IOHEXOL 300 MG/ML  SOLN COMPARISON:  None. FINDINGS: Lower chest: No acute abnormality. Hepatobiliary: A 1.1 cm diameter cystic appearing areas seen within the posterior aspect of the right lobe of the liver. An additional 0.7 cm focus of parenchymal low attenuation is seen within the anteromedial aspect of the right lobe. Status post cholecystectomy. No biliary dilatation. Pancreas: Unremarkable. No pancreatic ductal dilatation or surrounding inflammatory changes. Spleen: Normal in size without focal abnormality. Adrenals/Urinary Tract: Adrenal glands are unremarkable. The left kidney is normal in size. The right kidney is mildly decreased in size and mildly lobulated in appearance. The a 1.6 cm cyst is seen within the lateral aspect of the mid right kidney the. 0.9 cm, 1.3 cm and a 1.7 cm cysts are noted within the mid to upper left kidney. Bladder is unremarkable. Stomach/Bowel: Stomach is within normal limits.  There is mild appendiceal wall thickening (axial CT images 53 through 59, CT series number 2). A very mild amount of periappendiceal inflammatory fat  stranding is also noted. There is no evidence of free air. No evidence of bowel dilatation. Vascular/Lymphatic: No significant vascular findings are present. No enlarged abdominal or pelvic lymph nodes. Reproductive: The uterus is normal in size and appearance. A 2.0 cm x 1.4 cm cyst is seen along the posterior aspect of the left adnexa. Other: No abdominal wall hernia or abnormality. No abdominopelvic ascites. Musculoskeletal: No acute or significant osseous findings. IMPRESSION: 1. Findings consistent with mild appendicitis. 2. Small hepatic cysts versus hemangiomas. 3. Bilateral renal cysts. 4. Left adnexal cyst, likely ovarian in origin. Electronically Signed   By: Aram Candela M.D.   On: 11/03/2019 22:30    Review of Systems  Constitutional: Positive for appetite change. Negative for chills, diaphoresis and fever.  HENT: Negative.   Eyes: Negative.   Respiratory: Negative.   Cardiovascular: Negative.   Gastrointestinal: Positive for abdominal pain, diarrhea, nausea and vomiting.  Endocrine: Negative.   Genitourinary: Negative.   Musculoskeletal: Negative.   Skin: Negative.   Allergic/Immunologic: Negative.   Neurological: Negative.   Hematological: Negative.   Psychiatric/Behavioral: The patient is nervous/anxious.     Physical Exam   Blood pressure 117/87, pulse 70, temperature 98.3 F (36.8 C), temperature source Oral, resp. rate 16, height 5\' 6"  (1.676 m), weight 127.9 kg, last menstrual period 10/17/2019, SpO2 100 %.  CONSTITUTIONAL: no acute distress; conversant; no obvious deformities  EYES: conjunctiva moist; no lid lag; anicteric; pupils equal bilaterally  NECK: trachea midline; no thyroid nodularity  LUNGS: respiratory effort normal & unlabored; no wheeze; no rales; no tactile fremitus  CV: rate and rhythm  regular; no palpable thrills; no murmur; no edema bilat lower extremities  GI: abdomen is soft, obese; mild to moderate tenderness in epigastrium; no mass; no tenderness bilateral lower quadrants; no hepatosplenomegaly; no obvious hernia  MSK: normal range of motion of extremities; no clubbing; no cyanosis  PSYCH: appropriate affect for situation; alert and oriented to person, place, & time  LYMPHATIC: no palpable cervical lymphadenopathy; no evidence lymphedema in extremities   Assessment/Plan Abdominal pain in epigastrium, acute appendicitis by CT scan  Admit to general surgery service  IV Rochephin, Flagyl started by ER MD  Repeat CBC in AM 10/24  Repeat abdominal exam in AM 10/24  Patient likely has acute appendicitis.  This would be consistent with her CT scan results and her elevated white blood cell count.  Nausea and emesis likely related to appendicitis.  However, her pain has been sharp, burning, and persistent in the epigastrium.  She has had a cholecystectomy in the distant past.  Antibiotics have been started by the emergency room physician.  We will admit the patient to the surgical service and monitor her overnight.  I will repeat her blood counts first thing in the morning.  We will make a final decision regarding surgery at that time.  Likely she will require laparoscopic appendectomy.  11/24, MD [facility] Office: [facility]    [phone], MD 11/03/2019, 11:16 PM

## 2019-11-04 DIAGNOSIS — R112 Nausea with vomiting, unspecified: Secondary | ICD-10-CM | POA: Diagnosis not present

## 2019-11-04 DIAGNOSIS — R1013 Epigastric pain: Secondary | ICD-10-CM | POA: Diagnosis not present

## 2019-11-04 LAB — RESPIRATORY PANEL BY RT PCR (FLU A&B, COVID)
Influenza A by PCR: NEGATIVE
Influenza B by PCR: NEGATIVE
SARS Coronavirus 2 by RT PCR: NEGATIVE

## 2019-11-04 LAB — CBC
HCT: 38.8 % (ref 36.0–46.0)
Hemoglobin: 12.4 g/dL (ref 12.0–15.0)
MCH: 26.1 pg (ref 26.0–34.0)
MCHC: 32 g/dL (ref 30.0–36.0)
MCV: 81.7 fL (ref 80.0–100.0)
Platelets: 361 10*3/uL (ref 150–400)
RBC: 4.75 MIL/uL (ref 3.87–5.11)
RDW: 15.7 % — ABNORMAL HIGH (ref 11.5–15.5)
WBC: 16.4 10*3/uL — ABNORMAL HIGH (ref 4.0–10.5)
nRBC: 0 % (ref 0.0–0.2)

## 2019-11-04 LAB — MRSA PCR SCREENING: MRSA by PCR: NEGATIVE

## 2019-11-04 LAB — HIV ANTIBODY (ROUTINE TESTING W REFLEX): HIV Screen 4th Generation wRfx: NONREACTIVE

## 2019-11-04 MED ORDER — METRONIDAZOLE IN NACL 5-0.79 MG/ML-% IV SOLN
500.0000 mg | Freq: Three times a day (TID) | INTRAVENOUS | Status: DC
  Administered 2019-11-04 – 2019-11-05 (×4): 500 mg via INTRAVENOUS
  Filled 2019-11-04 (×4): qty 100

## 2019-11-04 MED ORDER — SODIUM CHLORIDE 0.9 % IV SOLN
2.0000 g | INTRAVENOUS | Status: DC
  Administered 2019-11-04: 2 g via INTRAVENOUS
  Filled 2019-11-04: qty 20

## 2019-11-04 NOTE — Plan of Care (Signed)
  Problem: Clinical Measurements: Goal: Will remain free from infection Outcome: Progressing   Problem: Clinical Measurements: Goal: Diagnostic test results will improve Outcome: Progressing   Problem: Education: Goal: Knowledge of General Education information will improve Description: Including pain rating scale, medication(s)/side effects and non-pharmacologic comfort measures Outcome: Progressing   

## 2019-11-04 NOTE — Progress Notes (Signed)
Assessment & Plan: HD#2 - Abdominal pain in epigastrium, acute appendicitis by CT scan             Continue IV Rochephin, Flagyl             Repeat CBC in AM 10/25             Follow abdominal exam  Allow diet this AM  Encourage OOB, ambulation  Patient improved this AM.  Still denies RLQ abdominal pain, epigastric pain greatly improved.  WBC improved to 16K overnight.  Will continue to monitor closely.  Hold on operative intervention at present.        Darnell Level, MD       [facility]       Office: [phone]   Chief Complaint: Abdominal pain, epigastrium  Subjective: Patient in bed, appears comfortable.  States much better this morning.  Objective: Vital signs in last 24 hours: Temp:  [98.1 F (36.7 C)-98.4 F (36.9 C)] 98.1 F (36.7 C) (10/24 0434) Pulse Rate:  [70-90] 88 (10/24 0434) Resp:  [15-18] 16 (10/24 0434) BP: (117-144)/(54-96) 132/54 (10/24 0434) SpO2:  [99 %-100 %] 100 % (10/24 0434) Weight:  [127.9 kg] 127.9 kg (10/23 1936) Last BM Date: 11/03/19  Intake/Output from previous day: 10/23 0701 - 10/24 0700 In: 1730.7 [I.V.:530.7; IV Piggyback:1200] Out: -  Intake/Output this shift: No intake/output data recorded.  Physical Exam: HEENT - sclerae clear, mucous membranes moist Neck - soft Chest - clear bilaterally Cor - RRR Abdomen - soft, obese; minimal tenderness epigastrium, no guarding; non-tender lower abdomen Ext - no edema, non-tender Neuro - alert & oriented, no focal deficits  Lab Results:  Recent Labs    11/03/19 1945 11/04/19 0308  WBC 19.5* 16.4*  HGB 13.2 12.4  HCT 40.4 38.8  PLT 443* 361   BMET Recent Labs    11/03/19 1945  NA 135  K 4.0  CL 107  CO2 18*  GLUCOSE 116*  BUN 9  CREATININE 0.79  CALCIUM 8.9   PT/INR No results for input(s): LABPROT, INR in the last 72 hours. Comprehensive Metabolic Panel:    Component Value Date/Time   NA 135 11/03/2019 1945   NA 138 07/23/2019 1505   K  4.0 11/03/2019 1945   K 4.1 07/23/2019 1505   CL 107 11/03/2019 1945   CL 105 07/23/2019 1505   CO2 18 (L) 11/03/2019 1945   CO2 25 07/23/2019 1505   BUN 9 11/03/2019 1945   BUN 11 07/23/2019 1505   CREATININE 0.79 11/03/2019 1945   CREATININE 0.74 07/23/2019 1505   CREATININE 0.52 08/16/2013 0842   CREATININE 0.57 06/11/2013 1006   GLUCOSE 116 (H) 11/03/2019 1945   GLUCOSE 87 07/23/2019 1505   CALCIUM 8.9 11/03/2019 1945   CALCIUM 9.2 07/23/2019 1505   AST 23 11/03/2019 1945   AST 17 07/23/2019 1505   ALT 32 11/03/2019 1945   ALT 26 07/23/2019 1505   ALKPHOS 105 11/03/2019 1945   ALKPHOS 108 07/23/2019 1505   BILITOT 0.4 11/03/2019 1945   BILITOT 0.3 07/23/2019 1505   PROT 7.6 11/03/2019 1945   PROT 6.7 07/23/2019 1505   ALBUMIN 3.8 11/03/2019 1945   ALBUMIN 3.9 07/23/2019 1505    Studies/Results: CT Abdomen Pelvis W Contrast  Result Date: 11/03/2019 CLINICAL DATA:  Abdominal pain and distension. EXAM: CT ABDOMEN AND PELVIS WITH CONTRAST TECHNIQUE: Multidetector CT imaging of the abdomen and pelvis was performed using the standard protocol following  bolus administration of intravenous contrast. CONTRAST:  OMNIPAQUE IOHEXOL 300 MG/ML  SOLN COMPARISON:  None. FINDINGS: Lower chest: No acute abnormality. Hepatobiliary: A 1.1 cm diameter cystic appearing areas seen within the posterior aspect of the right lobe of the liver. An additional 0.7 cm focus of parenchymal low attenuation is seen within the anteromedial aspect of the right lobe. Status post cholecystectomy. No biliary dilatation. Pancreas: Unremarkable. No pancreatic ductal dilatation or surrounding inflammatory changes. Spleen: Normal in size without focal abnormality. Adrenals/Urinary Tract: Adrenal glands are unremarkable. The left kidney is normal in size. The right kidney is mildly decreased in size and mildly lobulated in appearance. The a 1.6 cm cyst is seen within the lateral aspect of the mid right kidney the.  0.9 cm, 1.3 cm and a 1.7 cm cysts are noted within the mid to upper left kidney. Bladder is unremarkable. Stomach/Bowel: Stomach is within normal limits. There is mild appendiceal wall thickening (axial CT images 53 through 59, CT series number 2). A very mild amount of periappendiceal inflammatory fat stranding is also noted. There is no evidence of free air. No evidence of bowel dilatation. Vascular/Lymphatic: No significant vascular findings are present. No enlarged abdominal or pelvic lymph nodes. Reproductive: The uterus is normal in size and appearance. A 2.0 cm x 1.4 cm cyst is seen along the posterior aspect of the left adnexa. Other: No abdominal wall hernia or abnormality. No abdominopelvic ascites. Musculoskeletal: No acute or significant osseous findings. IMPRESSION: 1. Findings consistent with mild appendicitis. 2. Small hepatic cysts versus hemangiomas. 3. Bilateral renal cysts. 4. Left adnexal cyst, likely ovarian in origin. Electronically Signed   By: Aram Candela M.D.   On: 11/03/2019 22:30      Darnell Level 11/04/2019  Patient ID: Stephanie Davies, female   DOB: [birth year], 44 y.o.   MRN: [id number]

## 2019-11-05 ENCOUNTER — Encounter: Payer: Self-pay | Admitting: Gastroenterology

## 2019-11-05 DIAGNOSIS — R112 Nausea with vomiting, unspecified: Secondary | ICD-10-CM | POA: Diagnosis not present

## 2019-11-05 DIAGNOSIS — R1013 Epigastric pain: Secondary | ICD-10-CM | POA: Diagnosis not present

## 2019-11-05 LAB — CBC
HCT: 36 % (ref 36.0–46.0)
Hemoglobin: 11.2 g/dL — ABNORMAL LOW (ref 12.0–15.0)
MCH: 26.2 pg (ref 26.0–34.0)
MCHC: 31.1 g/dL (ref 30.0–36.0)
MCV: 84.3 fL (ref 80.0–100.0)
Platelets: 327 10*3/uL (ref 150–400)
RBC: 4.27 MIL/uL (ref 3.87–5.11)
RDW: 16.1 % — ABNORMAL HIGH (ref 11.5–15.5)
WBC: 8.4 10*3/uL (ref 4.0–10.5)
nRBC: 0 % (ref 0.0–0.2)

## 2019-11-05 MED ORDER — POLYETHYLENE GLYCOL 3350 17 G PO PACK: 17.0000 g | PACK | Freq: Every day | ORAL | 0 refills | Status: DC | PRN

## 2019-11-05 MED ORDER — AMOXICILLIN-POT CLAVULANATE 875-125 MG PO TABS: 1.0000 | ORAL_TABLET | Freq: Two times a day (BID) | ORAL | 0 refills | Status: DC

## 2019-11-05 NOTE — Progress Notes (Signed)
Reviewed d/c instructions w/ patient. Verbalized understanding of instructions, all questions answered. PIV d/c'd. Awaiting ride to home. Will d/c w/ all belongings and instructions.

## 2019-11-05 NOTE — Discharge Summary (Signed)
[facility] Discharge Summary   Patient ID: Stephanie Davies MRN: [id number] DOB/AGE: 09-03-1975 44 y.o.  Admit date: 11/03/2019 Discharge date: 11/05/2019  Admitting Diagnosis: Epigastric abdominal pain Acute appendicitis by CT scan  Discharge Diagnosis Patient Active Problem List   Diagnosis Date Noted  . Abdominal pain 11/03/2019  . Appendicitis, acute 11/03/2019  . Acute appendicitis 11/03/2019  . IBS (irritable bowel syndrome) 07/23/2019  . History of kidney stones 07/23/2019  . Pseudotumor cerebri   . Anxiety 05/13/2016  . Cough 04/11/2015  . PCP NOTES >>>>>>>>>>>>>>>>>>>>>>>>>>>>>>>>>> 09/20/2014  . Anxiety and depression 03/07/2014  . Goiter 03/07/2014  . Hives 02/20/2014  . GERD (gastroesophageal reflux disease) 02/14/2014  . Upper airway cough syndrome 01/22/2014  . Eczema, allergic 01/09/2014  . Dyspnea 01/09/2014  . Hip pain 10/15/2013  . Pain in joint, pelvic region and thigh 10/08/2013  . External bleeding hemorrhoids 10/08/2013  . Rh negative status during pregnancy 06/06/2013    Consultants None  Imaging: CT Abdomen Pelvis W Contrast  Result Date: 11/03/2019 CLINICAL DATA:  Abdominal pain and distension. EXAM: CT ABDOMEN AND PELVIS WITH CONTRAST TECHNIQUE: Multidetector CT imaging of the abdomen and pelvis was performed using the standard protocol following bolus administration of intravenous contrast. CONTRAST:  OMNIPAQUE IOHEXOL 300 MG/ML  SOLN COMPARISON:  None. FINDINGS: Lower chest: No acute abnormality. Hepatobiliary: A 1.1 cm diameter cystic appearing areas seen within the posterior aspect of the right lobe of the liver. An additional 0.7 cm focus of parenchymal low attenuation is seen within the anteromedial aspect of the right lobe. Status post cholecystectomy. No biliary dilatation. Pancreas: Unremarkable. No pancreatic ductal dilatation or surrounding inflammatory changes. Spleen: Normal in size without focal abnormality.  Adrenals/Urinary Tract: Adrenal glands are unremarkable. The left kidney is normal in size. The right kidney is mildly decreased in size and mildly lobulated in appearance. The a 1.6 cm cyst is seen within the lateral aspect of the mid right kidney the. 0.9 cm, 1.3 cm and a 1.7 cm cysts are noted within the mid to upper left kidney. Bladder is unremarkable. Stomach/Bowel: Stomach is within normal limits. There is mild appendiceal wall thickening (axial CT images 53 through 59, CT series number 2). A very mild amount of periappendiceal inflammatory fat stranding is also noted. There is no evidence of free air. No evidence of bowel dilatation. Vascular/Lymphatic: No significant vascular findings are present. No enlarged abdominal or pelvic lymph nodes. Reproductive: The uterus is normal in size and appearance. A 2.0 cm x 1.4 cm cyst is seen along the posterior aspect of the left adnexa. Other: No abdominal wall hernia or abnormality. No abdominopelvic ascites. Musculoskeletal: No acute or significant osseous findings. IMPRESSION: 1. Findings consistent with mild appendicitis. 2. Small hepatic cysts versus hemangiomas. 3. Bilateral renal cysts. 4. Left adnexal cyst, likely ovarian in origin. Electronically Signed   By: Aram Candela M.D.   On: 11/03/2019 22:30    Procedures None  Hospital Course:  Stephanie Davies is a 44yo female who [facility] with less than 24 hour history of abdominal pain.  Patient notes onset that day of steady sharp burning epigastric abdominal pain.  This has been associated with nausea and emesis.  Patient has also had diarrhea.  Patient presented to the emergency room where laboratory studies showed an elevated white blood cell count of 19.5.  Patient subsequently underwent CT scan abdomen and pelvis with findings of early acute appendicitis.  General surgery is called to evaluate and  manage. Symptoms atypical for appendicitis, so the patient was admitted to the  hospital for observation and started on IV rocephin/flagyl. Pain improved and leukocytosis resolved. Diet advanced as tolerated. On 10/25 the patient was voiding well, tolerating diet, ambulating well, pain well controlled, vital signs stable and felt stable for discharge home.  She will go home on 7 days of augmentin and follow up in the office in a few weeks. Patient will follow up as below and knows to call with questions or concerns.    I have personally reviewed the patients medication history on the [place] controlled substance database.    Physical Exam: General:  Alert, NAD, pleasant, comfortable Cardio: RRR Pulm: CTAB, rate and effort normal Abd:  Soft, ND, NT, +BS, no HSM Neuro: no focal deficits Skin: warm and dry, no lesions noted  Allergies as of 11/05/2019   No Known Allergies     Medication List    TAKE these medications   amoxicillin-clavulanate 875-125 MG tablet Commonly known as: Augmentin Take 1 tablet by mouth 2 (two) times daily.   aspirin-acetaminophen-caffeine 250-250-65 MG tablet Commonly known as: EXCEDRIN MIGRAINE Take 2 tablets by mouth daily.   hydrOXYzine 10 MG tablet Commonly known as: ATARAX/VISTARIL Take 1-2 tablets (10-20 mg total) by mouth 2 (two) times daily as needed (anxiety).   hyoscyamine 0.125 MG tablet Commonly known as: LEVSIN Take 1 tablet (0.125 mg total) by mouth every 4 (four) hours as needed.   ketoconazole 2 % shampoo Commonly known as: NIZORAL Apply 1 application topically 2 (two) times a week.   pantoprazole 20 MG tablet Commonly known as: PROTONIX Take 20 mg by mouth daily.   polyethylene glycol 17 g packet Commonly known as: MiraLax Take 17 g by mouth daily as needed for mild constipation.   ProAir HFA 108 (90 Base) MCG/ACT inhaler Generic drug: albuterol Inhale 2 puffs into the lungs every 6 (six) hours as needed for wheezing or shortness of breath.   sertraline 100 MG tablet Commonly known as: Zoloft 1 tablet daily  for 2 weeks, then 1.5 tablets daily What changed:   how much to take  how to take this  when to take this         Follow-up Information    Darnell Level, MD. Go on 11/27/2019.   Specialty: General Surgery Why: 11/16 at 3pm Please arrive 30 minutes prior to your appointment to check in and fill out paperwork. Bring photo ID and insurance information. Contact information: [address] [place] [address] [phone]               Signed: Franne Forts, [facility] Surgery 11/05/2019, 9:28 AM Please see Amion for pager number during day hours 7:00am-4:30pm

## 2019-11-05 NOTE — Plan of Care (Signed)

## 2019-11-05 NOTE — Discharge Instructions (Signed)
Take antibiotics as prescribed Follow up as scheduled with Dr. Gerrit Friends If symptoms worsen (ie abdominal pain, nausea/vomiting, fever...) please call our office or return to ED

## 2019-11-06 ENCOUNTER — Telehealth: Payer: Self-pay

## 2019-11-06 NOTE — Telephone Encounter (Signed)
Transition Care Management Follow-up Telephone Call  Date of discharge and from where: 11/05/2019 Stephanie Davies   How have you been since you were released from the hospital? Doing well   Any questions or concerns? Yes  Items Reviewed:  Did the pt receive and understand the discharge instructions provided? Yes   Medications obtained and verified? Yes   Other? No   Any new allergies since your discharge? No   Dietary orders reviewed? Yes  Do you have support at home? Yes   Home Care and Equipment/Supplies: Were home health services ordered? not applicable If so, what is the name of the agency?   Has the agency set up a time to come to the patient's home? not applicable Were any new equipment or medical supplies ordered?  No What is the name of the medical supply agency?  Were you able to get the supplies/equipment? no Do you have any questions related to the use of the equipment or supplies? No  Functional Questionnaire: (I = Independent and D = Dependent) ADLs: I  Bathing/Dressing- I  Meal Prep- I  Eating- I  Maintaining continence- I  Transferring/Ambulation- I  Managing Meds- I  Follow up appointments reviewed:   PCP Hospital f/u appt confirmed? Yes  Scheduled to see Dr. Drue Novel on 11/23/2019  Specialist Hospital f/u appt confirmed? No   Are transportation arrangements needed? No   If their condition worsens, is the pt aware to call PCP or go to the Emergency Dept.? Yes  Was the patient provided with contact information for the PCP's office or ED? Yes  Was to pt encouraged to call back with questions or concerns? Yes

## 2019-11-07 ENCOUNTER — Other Ambulatory Visit: Payer: Self-pay

## 2019-11-07 ENCOUNTER — Ambulatory Visit
Admission: RE | Admit: 2019-11-07 | Discharge: 2019-11-07 | Disposition: A | Discharge status: Home / Self Care | Payer: Medicaid Other | Source: Ambulatory Visit | Attending: Neurology | Admitting: Neurology

## 2019-11-07 DIAGNOSIS — G8929 Other chronic pain: Secondary | ICD-10-CM

## 2019-11-07 DIAGNOSIS — R519 Headache, unspecified: Secondary | ICD-10-CM | POA: Diagnosis not present

## 2019-11-07 DIAGNOSIS — G478 Other sleep disorders: Secondary | ICD-10-CM | POA: Diagnosis not present

## 2019-11-07 DIAGNOSIS — R5382 Chronic fatigue, unspecified: Secondary | ICD-10-CM | POA: Diagnosis not present

## 2019-11-07 DIAGNOSIS — G2581 Restless legs syndrome: Secondary | ICD-10-CM

## 2019-11-07 DIAGNOSIS — M791 Myalgia, unspecified site: Secondary | ICD-10-CM

## 2019-11-07 MED ORDER — GADOBENATE DIMEGLUMINE 529 MG/ML IV SOLN
20.0000 mL | Freq: Once | INTRAVENOUS | Status: AC | PRN
  Administered 2019-11-07: 20 mL via INTRAVENOUS

## 2019-11-08 ENCOUNTER — Encounter: Payer: Medicaid Other | Admitting: Neurology

## 2019-11-09 NOTE — Progress Notes (Signed)
IMPRESSION: This MRI of the brain with and without contrast shows the following: 1.   Brain parenchyma appears normal and there are no acute findings. 2.   The pituitary gland is thinned within an enlarged sella turcica.  Additionally, the optic nerve sheaths are enlarged.  This combination is often seen with elevated intracranial pressure though could also be incidental.  The appearance is similar to the 2013 MRI. 3.   There are no acute findings and there is a normal enhancement pattern. INTERPRETING PHYSICIAN:  Richard A. Epimenio Foot, MD, PhD, [place]  I can recommend a spinal tap to measure opening pressure and confirm the diagnosis. CD

## 2019-11-10 NOTE — Addendum Note (Signed)
Addended by: Melvyn Novas on: 11/10/2019 03:56 PM   Modules accepted: Orders

## 2019-11-10 NOTE — Procedures (Signed)
Sleep Study Report   Patient Information     First Name: Stephanie Davies. Last Name: Stephanie Davies ID: [id number]  Birth Date: March 17, 1975 Age: 44 Gender: Female  Referring Provider: Wanda Plump, MD BMI: 46.1 (W=286 lb, H=5' 6'')  Neck Circ.:  18 '' Epworth:  2/24   Sleep Study Information    Study Date: 10/31/19 S/H/A Version: [id number] / 4.2.1023 / 33  History:    Mrs. Stephanie Davies was seen in consultation requested by Dr Drue Novel on 10-16-19. She has a concern of headaches and pins-and-needles sensations pain throughout her body( including headaches and allodynia even before her toddler daughter was conceived).  She has had difficulties conceiving.  She remembers that during her pregnancy she could hardly exercise or walk due to pain.  She has presented for several years now to different specialists, she remains very sensitive to touch, reports frequent sharp and stabbing pains and wakes up with headaches.  She had lost 80 pounds only to gain the weight back but she did neither feel an improvement of her body pains nor her headaches, after she achieved weight loss.  MRI of the brain and lumbar spine were negative, but she has not had a PSG and neither nerve conduction study/ EMG, and is concerned whether she would be able to tolerate a needle test.  Dr. Drue Novel states that she is taking tizanidine and Excedrin Migraine almost daily for headaches and he questioned since she wakes up with headaches if the patient ever had a sleep study. She did not.  She wakes up unrefreshed and restored but feels strongly that the pain is affecting her sleep quality as it does in daytime her life quality overall. 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 REM AHI was 24.5/h. Supine sleep was associated with an AHI of 14.7/h versus non supine sleep of 6.6/h. There was no abnormal heart rate variability and no clinically relevant degree of sleep hypoxemia.          Recommendations:     My first advise is to try and avoid supine sleep, and to reduce body weight further. 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  I recommend CPAP autotitration therapy at pressures of  5-12 cm water, 2 cm EPR and heated humidity, with a mask of the patient's choice.    Interpreting Physician: Melvyn Novas, MD          Sleep Summary  Oxygen Saturation Statistics   Start Study Time: End Study Time: Total Recording Time:  7:29:10 PM 5:32:01 AM 10 h, 2 min  Total Sleep Time % REM of Sleep Time:  8 h, 10 min  14.1    Mean: 94 Minimum: 82 Maximum: 98  Mean of Desaturations Nadirs (%):   92  Oxygen Desaturation. %:  4-9 10-20 >20 Total  Events Number Total   36  1 97.3 2.7  0 0.0  37 100.0  Oxygen Saturation: <90 <=88 <85 <80 <70  Duration (minutes): Sleep % 1.0 0.2 0.6 0.3 0.1 0.1 0.0 0.0 0.0 0.0     Respiratory Indices      Total Events REM NREM All Night  pRDI: pAHI 3%: ODI 4%: pAHIc 3%: % CSR: pAHI 4%:  135  104  37  9  0.0 51 26.7 24.5 15.6 1.4 16.6 12.3 3.4 1.5 17.8 13.7 4.9 1.5 6.7       Pulse Rate Statistics during Sleep (BPM)      Mean: 83 Minimum: 64 Maximum: 114    Indices are calculated using technically valid sleep time of 7 h, 35 min.                         pAHI=13.7                                  Mild              Moderate                    Severe                                                 5              15                    30   Body Position Statistics  Position Supine Prone Right Left Non-Supine  Sleep (min) 431.8 39.0 7.0 13.0 59.0  Sleep % 88.0 7.9 1.4 2.6 12.0  pRDI 18.6 6.9 N/A 23.1 12.1  pAHI 3% 14.7 3.4 N/A 9.2 6.6  ODI 4% 5.4 0.0 N/A 4.6 1.1               Left   Prone  Right  Supine    Snoring Statistics  Snoring Level (dB) >40 >50 >60 >70 >80 >Threshold (45)  Sleep (min) 217.8 3.3 1.5 0.0 0.0 5.9  Sleep % 44.4 0.7 0.3 0.0 0.0 1.2    Mean: 4

## 2019-11-10 NOTE — Progress Notes (Signed)
Summary & Diagnosis:    This HST confirmed the presence of mild OSA at AHI 13/h in a  REM-sleep dependent form. REM AHI was 24.5/h. Supine sleep was  associated with an AHI of 14.7/h versus non supine sleep of  6.6/h. There was no abnormal heart rate variability and no  clinically relevant degree of sleep hypoxemia.         Recommendations:    My first advise is to try and avoid supine sleep, and to reduce  body weight further.  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   I recommend CPAP autotitration therapy at pressures of 5-12 cm  water, 2 cm EPR and heated humidity, with a mask of the patient's  choice.    Interpreting Physician: Melvyn Novas, MD

## 2019-11-12 ENCOUNTER — Telehealth: Payer: Self-pay | Admitting: Neurology

## 2019-11-12 NOTE — Telephone Encounter (Signed)
-----   Message from Melvyn Novas, MD sent at 11/10/2019  3:56 PM EDT ----- Summary & Diagnosis:    This HST confirmed the presence of mild OSA at AHI 13/h in a  REM-sleep dependent form. REM AHI was 24.5/h. Supine sleep was  associated with an AHI of 14.7/h versus non supine sleep of  6.6/h. There was no abnormal heart rate variability and no  clinically relevant degree of sleep hypoxemia.         Recommendations:    My first advise is to try and avoid supine sleep, and to reduce  body weight further.  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   I recommend CPAP autotitration therapy at pressures of 5-12 cm  water, 2 cm EPR and heated humidity, with a mask of the patient's  choice.    Interpreting Physician: Melvyn Novas, MD

## 2019-11-12 NOTE — Telephone Encounter (Signed)
Called the patient to review the sleep study and MRI results with the patient. There was no answer. LVM advising the patient to call back.   MRI Results:IMPRESSION: This MRI of the brain with and without contrast shows the following:  1.  Brain parenchyma appears normal and there are no acute findings.  2.  The pituitary gland is thinned within an enlarged sella turcica. Additionally, the optic nerve sheaths are enlarged. This combination is often seen with elevated intracranial pressure though could also be incidental. The appearance is similar to the 2013 MRI.  3.  There are no acute findings and there is a normal enhancement pattern.  INTERPRETING PHYSICIAN:  Richard A. Epimenio Foot, MD, PhD, [place]   I can recommend a spinal tap to measure opening pressure and confirm the diagnosis. CD

## 2019-11-13 ENCOUNTER — Ambulatory Visit (INDEPENDENT_AMBULATORY_CARE_PROVIDER_SITE_OTHER): Payer: Medicaid Other | Admitting: Neurology

## 2019-11-13 ENCOUNTER — Other Ambulatory Visit: Payer: Self-pay

## 2019-11-13 ENCOUNTER — Encounter: Payer: Self-pay | Admitting: Neurology

## 2019-11-13 ENCOUNTER — Encounter: Payer: Medicaid Other | Admitting: Neurology

## 2019-11-13 DIAGNOSIS — R519 Headache, unspecified: Secondary | ICD-10-CM

## 2019-11-13 DIAGNOSIS — Z6841 Body Mass Index (BMI) 40.0 and over, adult: Secondary | ICD-10-CM

## 2019-11-13 DIAGNOSIS — M791 Myalgia, unspecified site: Secondary | ICD-10-CM

## 2019-11-13 DIAGNOSIS — R5382 Chronic fatigue, unspecified: Secondary | ICD-10-CM

## 2019-11-13 DIAGNOSIS — R208 Other disturbances of skin sensation: Secondary | ICD-10-CM | POA: Diagnosis not present

## 2019-11-13 DIAGNOSIS — G2581 Restless legs syndrome: Secondary | ICD-10-CM

## 2019-11-13 DIAGNOSIS — G8929 Other chronic pain: Secondary | ICD-10-CM

## 2019-11-13 DIAGNOSIS — Z0289 Encounter for other administrative examinations: Secondary | ICD-10-CM

## 2019-11-13 DIAGNOSIS — G478 Other sleep disorders: Secondary | ICD-10-CM

## 2019-11-13 NOTE — Progress Notes (Signed)
Full Name: Stephanie Davies Gender: Female MRN #: [id number] Date of Birth: [birth year]    Visit Date: 11/13/2019 08:46 Age: 44 Years Examining Physician: Despina Arias, MD  Referring Physician: Melvyn Novas, MD  History: Stephanie Davies is a 43 year old woman reporting chronic pain involving her muscles and dysesthesias in all limbs.  Nerve conduction studies: Bilateral peroneal and tibial motor responses had normal distal latencies, amplitudes and conduction velocities.  Bilateral sural and superficial peroneal sensory responses had normal peak latencies and amplitudes.  The tibial F-wave latencies were normal.  Galvanic sympathetic skin response in the Davies was normal.  Electromyography: Needle EMG of selected muscles of the right arm and leg was performed.  Motor unit action potentials had normal morphology and recruitment.  There was no abnormal spontaneous activity.  Impression: This is a normal NCV/EMG study.  There was no evidence of neuropathy, radiculopathy or myopathy.  Stephanie Males A. Epimenio Foot, MD, PhD, FAAN Certified in Neurology, Clinical Neurophysiology, Sleep Medicine, Pain Medicine and Neuroimaging Director, [facility] at [facility]  [facility] [address] [place], [place] [address] [phone]   Verbal informed consent was obtained from the patient, patient was informed of potential risk of procedure, including bruising, bleeding, hematoma formation, infection, muscle weakness, muscle pain, numbness, among others.         MNC    Nerve / Sites Muscle Latency Ref. Amplitude Ref. Rel Amp Segments Distance Velocity Ref. Area    ms ms mV mV %  cm m/s m/s mVms  L Peroneal - EDB     Ankle EDB 4.0 ?6.5 6.8 ?2.0 100 Ankle - EDB 9   22.0     Fib head EDB 10.0  6.4  93.9 Fib head - Ankle 30 50 ?44 21.8     Pop fossa EDB 12.1  6.1  95.3 Pop fossa - Fib head 10 49 ?44 21.5         Pop fossa - Ankle      R  Peroneal - EDB     Ankle EDB 3.8 ?6.5 8.0 ?2.0 100 Ankle - EDB 9   26.1     Fib head EDB 9.9  6.7  84.5 Fib head - Ankle 30 49 ?44 22.8     Pop fossa EDB 12.0  6.5  96.8 Pop fossa - Fib head 10 48 ?44 23.3         Pop fossa - Ankle      L Tibial - AH     Ankle AH 4.3 ?5.8 13.7 ?4.0 100 Ankle - AH 9   28.4     Pop fossa AH 12.2  10.3  75.2 Pop fossa - Ankle 38 48 ?41 24.4  R Tibial - AH     Ankle AH 4.4 ?5.8 15.3 ?4.0 100 Ankle - AH 9   34.1     Pop fossa AH 12.4  12.2  80.1 Pop fossa - Ankle 37 46 ?41 32.4             SSR    Nerve / Sites Latency   s  R Sympathetic - Davies     Davies 2.15          SNC    Nerve / Sites Rec. Site Peak Lat Ref.  Amp Ref. Segments Distance    ms ms V V  cm  L Sural - Ankle (Calf)     Calf Ankle 3.9 ?4.4 12 ?  6 Calf - Ankle 14  R Sural - Ankle (Calf)     Calf Ankle 3.8 ?4.4 8 ?6 Calf - Ankle 14  L Superficial peroneal - Ankle     Lat leg Ankle 3.3 ?4.4 6 ?6 Lat leg - Ankle 14  R Superficial peroneal - Ankle     Lat leg Ankle 3.1 ?4.4 6 ?6 Lat leg - Ankle 14             F  Wave    Nerve F Lat Ref.   ms ms  L Tibial - AH 50.7 ?56.0  R Tibial - AH 49.5 ?56.0         EMG Summary Table    Spontaneous MUAP Recruitment  Muscle IA Fib PSW Fasc Other Amp Dur. Poly Pattern  R. Vastus medialis Normal None None None _______ Normal Normal Normal Normal  R. Iliopsoas Normal None None None _______ Normal Normal Normal Normal  R. Tibialis anterior Normal None None None _______ Normal Normal Normal Normal  R. Gastrocnemius (Medial head) Normal None None None _______ Normal Normal Normal Normal  R. Peroneus longus Normal None None None _______ Normal Normal Normal Normal  R. Gluteus medius Normal None None None _______ Normal Normal Normal Normal  R. Deltoid Normal None None None _______ Normal Normal Normal Normal  R. Triceps brachii Normal None None None _______ Normal Normal Normal Normal  R. Biceps brachii Normal None None None _______ Normal Normal  Normal Normal  R. Extensor digitorum communis Normal None None None _______ Normal Normal Normal Normal  R. First dorsal interosseous Normal None None None _______ Normal Normal Normal Normal

## 2019-11-15 NOTE — Telephone Encounter (Signed)
Called patient to discuss sleep study results, MRI. No answer at this time. LVM for the patient to call back.  This is 3rd attempt to reach out

## 2019-11-22 DIAGNOSIS — R102 Pelvic and perineal pain: Secondary | ICD-10-CM | POA: Diagnosis not present

## 2019-11-22 DIAGNOSIS — Z3184 Encounter for fertility preservation procedure: Secondary | ICD-10-CM | POA: Diagnosis not present

## 2019-11-23 ENCOUNTER — Encounter: Payer: Self-pay | Admitting: Internal Medicine

## 2019-11-29 ENCOUNTER — Ambulatory Visit (INDEPENDENT_AMBULATORY_CARE_PROVIDER_SITE_OTHER): Payer: Medicaid Other | Admitting: Family Medicine

## 2019-11-29 ENCOUNTER — Encounter: Payer: Medicaid Other | Admitting: Family Medicine

## 2019-11-29 ENCOUNTER — Other Ambulatory Visit (HOSPITAL_COMMUNITY)
Admission: RE | Admit: 2019-11-29 | Discharge: 2019-11-29 | Disposition: A | Discharge status: Home / Self Care | Payer: Medicaid Other | Source: Ambulatory Visit | Attending: Family Medicine | Admitting: Family Medicine

## 2019-11-29 ENCOUNTER — Other Ambulatory Visit: Payer: Self-pay

## 2019-11-29 ENCOUNTER — Encounter: Payer: Self-pay | Admitting: Family Medicine

## 2019-11-29 VITALS — BP 117/72 | HR 73 | Ht 66.0 in | Wt 281.0 lb

## 2019-11-29 DIAGNOSIS — Z3169 Encounter for other general counseling and advice on procreation: Secondary | ICD-10-CM | POA: Diagnosis not present

## 2019-11-29 DIAGNOSIS — Z124 Encounter for screening for malignant neoplasm of cervix: Secondary | ICD-10-CM

## 2019-11-29 DIAGNOSIS — E66813 Obesity, class 3: Secondary | ICD-10-CM | POA: Insufficient documentation

## 2019-11-29 DIAGNOSIS — Z01419 Encounter for gynecological examination (general) (routine) without abnormal findings: Secondary | ICD-10-CM

## 2019-11-29 DIAGNOSIS — Z0000 Encounter for general adult medical examination without abnormal findings: Secondary | ICD-10-CM | POA: Insufficient documentation

## 2019-11-29 NOTE — Assessment & Plan Note (Addendum)
Given age, [facility] and low sperm counts, will refer to [facility].Would likely need IVF and potentially ICSI. They do not take medicaid. She will pay out of pocket. Approached donor eggs and she was not comfortable with that and do not suspect she will respond well to ovarian stimulation given AMH of 0.2. She wants a realistic assessment of cost, potential for success, etc.

## 2019-11-29 NOTE — Patient Instructions (Signed)
 Preventive Care 21-42 Years Old, Female Preventive care refers to visits with your health care provider and lifestyle choices that can promote health and wellness. This includes:  A yearly physical exam. This may also be called an annual well check.  Regular dental visits and eye exams.  Immunizations.  Screening for certain conditions.  Healthy lifestyle choices, such as eating a healthy diet, getting regular exercise, not using drugs or products that contain nicotine and tobacco, and limiting alcohol use. What can I expect for my preventive care visit? Physical exam Your health care provider will check your:  Height and weight. This may be used to calculate body mass index (BMI), which tells if you are at a healthy weight.  Heart rate and blood pressure.  Skin for abnormal spots. Counseling Your health care provider may ask you questions about your:  Alcohol, tobacco, and drug use.  Emotional well-being.  Home and relationship well-being.  Sexual activity.  Eating habits.  Work and work environment.  Method of birth control.  Menstrual cycle.  Pregnancy history. What immunizations do I need?  Influenza (flu) vaccine  This is recommended every year. Tetanus, diphtheria, and pertussis (Tdap) vaccine  You may need a Td booster every 10 years. Varicella (chickenpox) vaccine  You may need this if you have not been vaccinated. Human papillomavirus (HPV) vaccine  If recommended by your health care provider, you may need three doses over 6 months. Measles, mumps, and rubella (MMR) vaccine  You may need at least one dose of MMR. You may also need a second dose. Meningococcal conjugate (MenACWY) vaccine  One dose is recommended if you are age 19-21 years and a first-year college student living in a residence hall, or if you have one of several medical conditions. You may also need additional booster doses. Pneumococcal conjugate (PCV13) vaccine  You may need  this if you have certain conditions and were not previously vaccinated. Pneumococcal polysaccharide (PPSV23) vaccine  You may need one or two doses if you smoke cigarettes or if you have certain conditions. Hepatitis A vaccine  You may need this if you have certain conditions or if you travel or work in places where you may be exposed to hepatitis A. Hepatitis B vaccine  You may need this if you have certain conditions or if you travel or work in places where you may be exposed to hepatitis B. Haemophilus influenzae type b (Hib) vaccine  You may need this if you have certain conditions. You may receive vaccines as individual doses or as more than one vaccine together in one shot (combination vaccines). Talk with your health care provider about the risks and benefits of combination vaccines. What tests do I need?  Blood tests  Lipid and cholesterol levels. These may be checked every 5 years starting at age 20.  Hepatitis C test.  Hepatitis B test. Screening  Diabetes screening. This is done by checking your blood sugar (glucose) after you have not eaten for a while (fasting).  Sexually transmitted disease (STD) testing.  BRCA-related cancer screening. This may be done if you have a family history of breast, ovarian, tubal, or peritoneal cancers.  Pelvic exam and Pap test. This may be done every 3 years starting at age 21. Starting at age 30, this may be done every 5 years if you have a Pap test in combination with an HPV test. Talk with your health care provider about your test results, treatment options, and if necessary, the need for more   tests. Follow these instructions at home: Eating and drinking   Eat a diet that includes fresh fruits and vegetables, whole grains, lean protein, and low-fat dairy.  Take vitamin and mineral supplements as recommended by your health care provider.  Do not drink alcohol if: ? Your health care provider tells you not to drink. ? You are  pregnant, may be pregnant, or are planning to become pregnant.  If you drink alcohol: ? Limit how much you have to 0-1 drink a day. ? Be aware of how much alcohol is in your drink. In the [place] one drink equals one 12 oz bottle of beer (355 mL), one 5 oz glass of wine (148 mL), or one 1 oz glass of hard liquor (44 mL). Lifestyle  Take daily care of your teeth and gums.  Stay active. Exercise for at least 30 minutes on 5 or more days each week.  Do not use any products that contain nicotine or tobacco, such as cigarettes, e-cigarettes, and chewing tobacco. If you need help quitting, ask your health care provider.  If you are sexually active, practice safe sex. Use a condom or other form of birth control (contraception) in order to prevent pregnancy and STIs (sexually transmitted infections). If you plan to become pregnant, see your health care provider for a preconception visit. What's next?  Visit your health care provider once a year for a well check visit.  Ask your health care provider how often you should have your eyes and teeth checked.  Stay up to date on all vaccines. This information is not intended to replace advice given to you by your health care provider. Make sure you discuss any questions you have with your health care provider. Document Revised: 09/08/2017 Document Reviewed: 09/08/2017 Elsevier Patient Education  2020 [employer] Inc[employer]  

## 2019-11-29 NOTE — Progress Notes (Signed)
Patient presents for annual exam. Patient would like to discuss fertility.

## 2019-11-29 NOTE — Progress Notes (Signed)
  Subjective:     Stephanie Davies is a 44 y.o. female and is here for a comprehensive physical exam. The patient reports problems - on-going infertility.  Has regular cycles. Had HSG in [place] and this was normal. U/s revealed 1.6 cm submucosal fibroid. Husband had semen analysis and they showed low counts and poor motility. She reports vitamins and possible other treatments in the past and none of that has helped. Her last baby was conceived naturally (same partner) and was born 6 years ago. She has low AMH.   The following portions of the patient's history were reviewed and updated as appropriate: allergies, current medications, past family history, past medical history, past social history, past surgical history and problem list.  Review of Systems Pertinent items noted in HPI and remainder of comprehensive ROS otherwise negative.   Objective:    BP 117/72   Pulse 73   Ht 5\' 6"  (1.676 m)   Wt 281 lb (127.5 kg)   LMP 11/09/2019 (Exact Date)   BMI 45.35 kg/m  General appearance: alert, cooperative, appears stated age and morbidly obese Head: Normocephalic, without obvious abnormality, atraumatic Neck: no adenopathy, supple, symmetrical, trachea midline and thyroid not enlarged, symmetric, no tenderness/mass/nodules Lungs: clear to auscultation bilaterally Breasts: normal appearance, no masses or tenderness Heart: regular rate and rhythm, S1, S2 normal, no murmur, click, rub or gallop Abdomen: soft, non-tender; bowel sounds normal; no masses,  no organomegaly Pelvic: cervix normal in appearance, external genitalia normal, no adnexal masses or tenderness, no cervical motion tenderness, uterus normal size, shape, and consistency and vagina normal without discharge Extremities: extremities normal, atraumatic, no cyanosis or edema Pulses: 2+ and symmetric Skin: Skin color, texture, turgor normal. No rashes or lesions Lymph nodes: Cervical, supraclavicular, and axillary nodes  normal. Neurologic: Grossly normal    Assessment:     GYN female exam.      Plan:   Problem List Items Addressed This Visit      Unprioritized   Obesity, Class III, BMI 40-49.9 (morbid obesity) (HCC)   Infertility counseling    Given age, AMH and low sperm counts, will refer to [facility].Would likely need IVF and potentially ICSI. They do not take medicaid. She will pay out of pocket. Approached donor eggs and she was not comfortable with that and do not suspect she will respond well to ovarian stimulation given AMH of 0.2. She wants a realistic assessment of cost, potential for success, etc.        Other Visit Diagnoses    Encounter for gynecological examination without abnormal finding    -  Primary   Relevant Orders   MM Digital Screening   Screening for cervical cancer       Relevant Orders   Cytology - PAP( [place])     Return in 1 year (on 11/28/2020).    See After Visit Summary for Counseling Recommendations

## 2019-11-30 ENCOUNTER — Encounter: Payer: Self-pay | Admitting: Internal Medicine

## 2019-11-30 ENCOUNTER — Ambulatory Visit (INDEPENDENT_AMBULATORY_CARE_PROVIDER_SITE_OTHER): Payer: Medicaid Other | Admitting: Internal Medicine

## 2019-11-30 VITALS — BP 119/72 | HR 88 | Temp 98.1°F | Resp 18 | Ht 66.0 in | Wt 284.1 lb

## 2019-11-30 DIAGNOSIS — L219 Seborrheic dermatitis, unspecified: Secondary | ICD-10-CM | POA: Diagnosis not present

## 2019-11-30 DIAGNOSIS — G4733 Obstructive sleep apnea (adult) (pediatric): Secondary | ICD-10-CM | POA: Diagnosis not present

## 2019-11-30 DIAGNOSIS — B369 Superficial mycosis, unspecified: Secondary | ICD-10-CM

## 2019-11-30 DIAGNOSIS — F32A Depression, unspecified: Secondary | ICD-10-CM

## 2019-11-30 DIAGNOSIS — F419 Anxiety disorder, unspecified: Secondary | ICD-10-CM | POA: Diagnosis not present

## 2019-11-30 MED ORDER — KETOCONAZOLE 2 % EX CREA: 1.0000 "application " | TOPICAL_CREAM | Freq: Every day | CUTANEOUS | 0 refills | Status: DC

## 2019-11-30 NOTE — Progress Notes (Signed)
Subjective:    Patient ID: Stephanie Davies, female    DOB: 1975/04/09, 44 y.o.   MRN: [id number]  DOS:  11/30/2019 Type of visit - description: Follow-up Follow-up regards anxiety, palpitations.  Also chart is reviewed. Was diagnosed with appendicitis.  Currently asymptomatic except for occasional epigastric burning without any other GI symptoms.  Also, complaining of ongoing scalp itching without a rash despite taking Nizoral shampoo.  Also, has developed a rash between the toes, slightly itchy.    Review of Systems See above   Past Medical History:  Diagnosis Date  . Alopecia   . Anal fistula   . Anxiety and depression 03/07/2014  . GERD (gastroesophageal reflux disease) 02/14/2014  . Goiter 03/07/2014  . Headache(784.0)   . Hemorrhoids   . Kidney stone   . Pseudotumor cerebri     Past Surgical History:  Procedure Laterality Date  . CHOLECYSTECTOMY  2004  . kidney stent: 12/2018 at [facility]      Allergies as of 11/30/2019   No Known Allergies     Medication List       Accurate as of November 30, 2019 11:59 PM. If you have any questions, ask your nurse or doctor.        aspirin-acetaminophen-caffeine 250-250-65 MG tablet Commonly known as: EXCEDRIN MIGRAINE Take 2 tablets by mouth daily.   hydrOXYzine 10 MG tablet Commonly known as: ATARAX/VISTARIL Take 1-2 tablets (10-20 mg total) by mouth 2 (two) times daily as needed (anxiety).   hyoscyamine 0.125 MG tablet Commonly known as: LEVSIN Take 1 tablet (0.125 mg total) by mouth every 4 (four) hours as needed.   ketoconazole 2 % shampoo Commonly known as: NIZORAL Apply 1 application topically 2 (two) times a week. What changed: Another medication with the same name was added. Make sure you understand how and when to take each. Changed by: Willow Ora, MD   ketoconazole 2 % cream Commonly known as: NIZORAL Apply 1 application topically daily. What changed: You were already taking a medication with the same  name, and this prescription was added. Make sure you understand how and when to take each. Changed by: Willow Ora, MD   pantoprazole 20 MG tablet Commonly known as: PROTONIX Take 20 mg by mouth daily.   ProAir HFA 108 (90 Base) MCG/ACT inhaler Generic drug: albuterol Inhale 2 puffs into the lungs every 6 (six) hours as needed for wheezing or shortness of breath.   sertraline 100 MG tablet Commonly known as: Zoloft 1 tablet daily for 2 weeks, then 1.5 tablets daily What changed:   how much to take  how to take this  when to take this          Objective:   Physical Exam BP 119/72 (BP Location: Left Arm, Patient Position: Sitting, Cuff Size: Normal)   Pulse 88   Temp 98.1 F (36.7 C) (Oral)   Resp 18   Ht 5\' 6"  (1.676 m)   Wt 284 lb 2 oz (128.9 kg)   LMP 11/09/2019 (Exact Date)   SpO2 100%   BMI 45.86 kg/m  General:   Well developed, NAD, BMI noted. HEENT:  Normocephalic . Face symmetric, atraumatic Skin: Scalp: Healthy-appearing throughout Has some maceration between the toes and the right foot.  Otherwise normal. Neurologic:  alert & oriented X3.  Speech normal, gait appropriate for age and unassisted Psych--  Cognition and judgment appear intact.  Cooperative with normal attention span and concentration.  Behavior appropriate. No anxious or depressed appearing.  Assessment    ASSESSMENT Anxiety, depression pseudotumor cerebri, headaches Goiter : [place] 03-2014, bx  Done 11-2015  IBS Kidney stones, kidney stent R 12/2018 renal cysts found on a renal ultrasound in [place] Goiter: Biopsy 08/23/2019, [facility] category 2 benign Appendicitis 10-2019, nonsurgical approach. OSA: Rx CPAP  PLAN: Anxiety, depression: Currently on sertraline, much improved in general.  No palpitations.  She continues to report days that she feels extremely well and other days that she feels depressed and sad without any obvious triggers raising the question of bipolarity. No  suicidal ideas. Rec to be seen by psychiatry, bipolar?.  List of available practitioners in the area provided. OSA: Home sleep study: Mild OSA, Rx CPAP.  The patient was "not aware" of the dx, encouraged to call neurology. Myalgias: Had a NCV 11/13/2019: Negative Fungal infection: Between the toes, Rx Nizoral Scalp itching: Scalp looks very healthy, unclear etiology, recommend to avoid any products other than Nizoral shampoo and possibly baby shampoo.  If needed she could see a dermatologist.  Addendum: Patient insisted on referral, will do. Appendicitis: Dx in October, nonsurgical approach, currently asymptomatic other than mild epigastric burning. RTC CPX 6 months  Time spent 30 minutes, doing chart review, explaining her that she has a sleep apnea and the need to be treated due to increased risk of cardiovascular mortality. Also assessing her scalp complaints.  I did review the chart including the NCV and admission for appendicitis  This visit occurred during the SARS-CoV-2 public health emergency.  Safety protocols were in place, including screening questions prior to the visit, additional usage of staff PPE, and extensive cleaning of exam room while observing appropriate contact time as indicated for disinfecting solutions.

## 2019-11-30 NOTE — Patient Instructions (Addendum)
Happy Holidays!  Please call the neurology office, you have a sleep apnea and need treatment. [phone] Or send a my chart message to Judi Cong, RN  Please get an appointment to see a psychiatrist.  Apply the cream we sent:  between your toes for 10 days  See a dermatologist if the scalp itching continue    GO TO THE FRONT DESK, PLEASE SCHEDULE YOUR APPOINTMENTS Come back for physical exam in 6 months

## 2019-11-30 NOTE — Progress Notes (Signed)
Pre visit review using our clinic review tool, if applicable. No additional management support is needed unless otherwise documented below in the visit note. 

## 2019-12-01 DIAGNOSIS — G4733 Obstructive sleep apnea (adult) (pediatric): Secondary | ICD-10-CM | POA: Insufficient documentation

## 2019-12-01 NOTE — Assessment & Plan Note (Signed)
Anxiety, depression: Currently on sertraline, much improved in general.  No palpitations.  She continues to report days that she feels extremely well and other days that she feels depressed and sad without any obvious triggers raising the question of bipolarity. No suicidal ideas. Rec to be seen by psychiatry, bipolar?.  List of available practitioners in the area provided. OSA: Home sleep study: Mild OSA, Rx CPAP.  The patient was "not aware" of the dx, encouraged to call neurology. Myalgias: Had a NCV 11/13/2019: Negative Fungal infection: Between the toes, Rx Nizoral Scalp itching: Scalp looks very healthy, unclear etiology, recommend to avoid any products other than Nizoral shampoo and possibly baby shampoo.  If needed she could see a dermatologist.  Addendum: Patient insisted on referral, will do. Appendicitis: Dx in October, nonsurgical approach, currently asymptomatic other than mild epigastric burning. RTC CPX 6 months

## 2019-12-03 LAB — CYTOLOGY - PAP
Comment: NEGATIVE
Diagnosis: NEGATIVE
High risk HPV: NEGATIVE

## 2019-12-11 ENCOUNTER — Ambulatory Visit (INDEPENDENT_AMBULATORY_CARE_PROVIDER_SITE_OTHER): Payer: Medicaid Other | Admitting: Gastroenterology

## 2019-12-11 ENCOUNTER — Encounter: Payer: Self-pay | Admitting: Gastroenterology

## 2019-12-11 ENCOUNTER — Other Ambulatory Visit (INDEPENDENT_AMBULATORY_CARE_PROVIDER_SITE_OTHER): Payer: Medicaid Other

## 2019-12-11 VITALS — BP 120/60 | HR 90 | Ht 66.0 in | Wt 285.0 lb

## 2019-12-11 DIAGNOSIS — G8929 Other chronic pain: Secondary | ICD-10-CM

## 2019-12-11 DIAGNOSIS — R109 Unspecified abdominal pain: Secondary | ICD-10-CM

## 2019-12-11 DIAGNOSIS — R935 Abnormal findings on diagnostic imaging of other abdominal regions, including retroperitoneum: Secondary | ICD-10-CM

## 2019-12-11 DIAGNOSIS — R1013 Epigastric pain: Secondary | ICD-10-CM | POA: Diagnosis not present

## 2019-12-11 LAB — CBC WITH DIFFERENTIAL/PLATELET
Basophils Absolute: 0.1 10*3/uL (ref 0.0–0.1)
Basophils Relative: 1.2 % (ref 0.0–3.0)
Eosinophils Absolute: 0.2 10*3/uL (ref 0.0–0.7)
Eosinophils Relative: 1.7 % (ref 0.0–5.0)
HCT: 41.7 % (ref 36.0–46.0)
Hemoglobin: 13.6 g/dL (ref 12.0–15.0)
Lymphocytes Relative: 14.3 % (ref 12.0–46.0)
Lymphs Abs: 1.7 10*3/uL (ref 0.7–4.0)
MCHC: 32.6 g/dL (ref 30.0–36.0)
MCV: 77.8 fl — ABNORMAL LOW (ref 78.0–100.0)
Monocytes Absolute: 0.5 10*3/uL (ref 0.1–1.0)
Monocytes Relative: 4.1 % (ref 3.0–12.0)
Neutro Abs: 9.3 10*3/uL — ABNORMAL HIGH (ref 1.4–7.7)
Neutrophils Relative %: 78.7 % — ABNORMAL HIGH (ref 43.0–77.0)
Platelets: 433 10*3/uL — ABNORMAL HIGH (ref 150.0–400.0)
RBC: 5.36 Mil/uL — ABNORMAL HIGH (ref 3.87–5.11)
RDW: 16 % — ABNORMAL HIGH (ref 11.5–15.5)
WBC: 11.8 10*3/uL — ABNORMAL HIGH (ref 4.0–10.5)

## 2019-12-11 MED ORDER — SUPREP BOWEL PREP KIT 17.5-3.13-1.6 GM/177ML PO SOLN: 1.0000 | ORAL | 0 refills | Status: DC

## 2019-12-11 MED ORDER — HYOSCYAMINE SULFATE 0.125 MG SL SUBL: 0.1250 mg | SUBLINGUAL_TABLET | SUBLINGUAL | 3 refills | Status: DC | PRN

## 2019-12-11 MED ORDER — AMBULATORY NON FORMULARY MEDICATION: 0 refills | Status: DC

## 2019-12-11 NOTE — Patient Instructions (Addendum)
ENDOSCOPY AND COLONOSCOPY: You have been scheduled for a endoscopy and colonoscopy. Please follow written instructions given to you at your visit today.   PREP: Please pick up your prep supplies at the pharmacy within the next 1-3 days.  INHALERS: If you use inhalers (even only as needed), please bring them with you on the day of your procedure.  PRESCRIPTION MEDICATION(S): We have sent the following medication(s) to your pharmacy:  . Hyoscyamine and Carafate Slurry (this slurry prescription has been faxed to your pharmacy)  LABS: Your provider has requested that you go to the basement level for lab work before leaving today. Press "B" on the elevator. The lab is located at the first door on the left as you exit the elevator.  HEALTHCARE LAWS AND MY CHART RESULTS: Due to recent changes in healthcare laws, you may see the results of your imaging and laboratory studies on MyChart before your provider has had a chance to review them.  We understand that in some cases there may be results that are confusing or concerning to you. Not all laboratory results come back in the same time frame and the provider may be waiting for multiple results in order to interpret others.  Please give [place] 48 hours in order for your provider to thoroughly review all the results before contacting the office for clarification of your results.   If you are age 42 or younger, your body mass index should be between 19-25. Your Body mass index is 46 kg/m. If this is out of the aformentioned range listed, please consider follow up with your Primary Care Provider.   Thank you for trusting me with your gastrointestinal care!    Tressia Danas, MD, MPH

## 2019-12-11 NOTE — Progress Notes (Signed)
Referring Provider: Wanda Plump, MD Primary Care Physician:  Wanda Plump, MD  Reason for Consultation:  Abdominal pain   IMPRESSION:  Acute on chronic left sided abdominal pain Upper abdominal pain Abnormal CT scan with ? Appendicitis  Abdominal pain is associated with constipation and bloating. Other etiologies of organic disease must be excluded. Although there may be a functional component. Abnormal appendix on CT would be an unusual cause of left sided abdominal/flank pain.  PLAN: - CBC with diff - Start Carafate slurry 1 g QID - Resume Hyoscyamine 0.125 mg q4 hours - Trial of Amitiza if no improvement on Carafate - EGD - Colonoscopy - Low threshold to repeat contrasted CT scan  Please see the "Patient Instructions" section for addition details about the plan.  HPI: Stephanie Davies is a 45 y.o. female referred by Dr. Drue Novel for further evaluation of abdominal pain.  The history is obtained through the patient and review of her electronic health record.  She has anxiety, depression, obstructive sleep apnea on CPAP, chronic myalgias, prior cholecystectomy for gallstones 2004.  Previously seen by Dr. Russella Dar in 2012 and Dr. Arlyce Dice 2016.   She reports a chronic, constant, left flank pain, worse over the last 4-5 months. Awakes with a constant upper abdominal pain feels like she has eaten too much hot pepper. Not reflux. Now, also awaking with a constant left flank radiates to the right groin. Feels like gas and pressure in the same area as the pain. Occasional constipation, that alternates with diarrhea after she takes some. Pain worsens with constipation. Associated bloating. No change with eating or movement. There is no blood or mucus.  Hospitalized in October for steady, sharp, burning epigastric abdominal pain with associated nausea and vomiting.  White count 19.5.  CT of the abdomen and pelvis with findings of early acute appendicitis.  Surgery felt her symptoms were atypical for  appendicitis so admitted for observation and IV Rocephin and Flagyl.  Pain resolved.  Leukocytosis resolved.  She was discharged home to complete 7 days of Augmentin. She has a follow-up appointment with surgery in January 2022.   She she had kidney difficulty in the past it presented with RUQ pain. She is concerned that she may be having referred pain to the left flank.   EGD in [place] 2 years ago for similar symptoms.  Recent abdominal imaging: CT of the abdomen and pelvis with contrast 11/03/2019 showed mild appendiceal wall thickening with a small amount of periappendiceal inflammatory fat stranding concerning for mild appendicitis.  A 2 cm x 1.4 cm left adnexal cyst, small hepatic cysts, and small renal cysts were also present.    Maternal grandmother and grandfather with esophageal cancer. No known family history of colon cancer or polyps. No family history of uterine/endometrial cancer, pancreatic cancer or gastric/stomach cancer.   Past Medical History:  Diagnosis Date  . Alopecia   . Anal fistula   . Anxiety and depression 03/07/2014  . GERD (gastroesophageal reflux disease) 02/14/2014  . Goiter 03/07/2014  . Headache(784.0)   . Hemorrhoids   . Kidney stone   . Pseudotumor cerebri     Past Surgical History:  Procedure Laterality Date  . CHOLECYSTECTOMY  2004  . kidney stent: 12/2018 at [facility]      Current Outpatient Medications  Medication Sig Dispense Refill  . aspirin-acetaminophen-caffeine (EXCEDRIN MIGRAINE) 250-250-65 MG tablet Take 2 tablets by mouth daily.    . hydrOXYzine (ATARAX/VISTARIL) 10 MG tablet Take 1-2 tablets (10-20 mg  total) by mouth 2 (two) times daily as needed (anxiety). 30 tablet 0  . ketoconazole (NIZORAL) 2 % cream Apply 1 application topically daily. 30 g 0  . ketoconazole (NIZORAL) 2 % shampoo Apply 1 application topically 2 (two) times a week. 120 mL 3  . pantoprazole (PROTONIX) 20 MG tablet Take 20 mg by mouth daily.     Marland Kitchen PROAIR HFA 108 (90  Base) MCG/ACT inhaler Inhale 2 puffs into the lungs every 6 (six) hours as needed for wheezing or shortness of breath. 18 g 0  . sertraline (ZOLOFT) 100 MG tablet 1 tablet daily for 2 weeks, then 1.5 tablets daily (Patient taking differently: Take 100 mg by mouth daily. 1 tablet daily for 2 weeks, then 1.5 tablets daily) 60 tablet 1   No current facility-administered medications for this visit.    Allergies as of 12/11/2019  . (No Known Allergies)    Family History  Problem Relation Age of Onset  . Diabetes Mother   . Hypertension Mother   . Esophageal cancer Maternal Grandmother   . Esophageal cancer Maternal Grandfather   . Diabetes Maternal Grandfather   . Colon cancer Neg Hx   . Colon polyps Neg Hx   . Gallbladder disease Neg Hx   . Kidney disease Neg Hx     Social History   Socioeconomic History  . Marital status: Married    Spouse name: Not on file  . Number of children: 1  . Years of education: Not on file  . Highest education level: Bachelor's degree (e.g., BA, AB, BS)  Occupational History  . Occupation: stay home  Tobacco Use  . Smoking status: Never Smoker  . Smokeless tobacco: Never Used  Vaping Use  . Vaping Use: Never used  Substance and Sexual Activity  . Alcohol use: No  . Drug use: No  . Sexual activity: Not Currently    Birth control/protection: None  Other Topics Concern  . Not on file  Social History Narrative   Original from from [place]   Lives w/ mother, husband overseas ([place]   Student-- [employer]   G1P1, daughter 46   Social Determinants of Health   Financial Resource Strain:   . Difficulty of Paying Living Expenses: Not on file  Food Insecurity:   . Worried About Programme researcher, broadcasting/film/video in the Last Year: Not on file  . Ran Out of Food in the Last Year: Not on file  Transportation Needs:   . Lack of Transportation (Medical): Not on file  . Lack of Transportation (Non-Medical): Not on file  Physical Activity:   . Days of Exercise  per Week: Not on file  . Minutes of Exercise per Session: Not on file  Stress:   . Feeling of Stress : Not on file  Social Connections:   . Frequency of Communication with Friends and Family: Not on file  . Frequency of Social Gatherings with Friends and Family: Not on file  . Attends Religious Services: Not on file  . Active Member of Clubs or Organizations: Not on file  . Attends Banker Meetings: Not on file  . Marital Status: Not on file  Intimate Partner Violence:   . Fear of Current or Ex-Partner: Not on file  . Emotionally Abused: Not on file  . Physically Abused: Not on file  . Sexually Abused: Not on file    Review of Systems: 12 system ROS is negative except as noted above with the addition of anxiety,  back pain, fatigue, and headaches.   Physical Exam: General:   Alert,  well-nourished, pleasant and cooperative in NAD Head:  Normocephalic and atraumatic. Eyes:  Sclera clear, no icterus.   Conjunctiva pink. Ears:  Normal auditory acuity. Nose:  No deformity, discharge,  or lesions. Mouth:  No deformity or lesions.   Neck:  Supple; no masses or thyromegaly. Lungs:  Clear throughout to auscultation.   No wheezes. Heart:  Regular rate and rhythm; no murmurs. Abdomen:  Soft, I am unable to reproduce her pain which she localized to the left rib/flank down the right flank, nondistended, normal bowel sounds, no rebound or guarding. No hepatosplenomegaly.   Rectal:  Deferred  Msk:  Symmetrical. No boney deformities LAD: No inguinal or umbilical LAD Extremities:  No clubbing or edema. Neurologic:  Alert and  oriented x4;  grossly nonfocal Skin:  Intact without significant lesions or rashes. Psych:  Alert and cooperative. Normal mood and affect.    Towanda Hornstein L. Orvan Falconer, MD, MPH 12/11/2019, 2:02 PM

## 2019-12-14 ENCOUNTER — Other Ambulatory Visit: Payer: Self-pay

## 2019-12-14 DIAGNOSIS — R935 Abnormal findings on diagnostic imaging of other abdominal regions, including retroperitoneum: Secondary | ICD-10-CM

## 2019-12-14 DIAGNOSIS — D72829 Elevated white blood cell count, unspecified: Secondary | ICD-10-CM

## 2019-12-21 ENCOUNTER — Encounter: Payer: Medicaid Other | Admitting: Internal Medicine

## 2019-12-24 ENCOUNTER — Telehealth: Payer: Self-pay | Admitting: Gastroenterology

## 2019-12-24 NOTE — Telephone Encounter (Signed)
Spoke with pt and her ct scan is 12/17, then the ecl is scheduled. Appt added to the cancellation list for the pt as there are not sooner appts available at this time. She is aware.

## 2019-12-24 NOTE — Telephone Encounter (Signed)
Patient states that abd pain is getting stronger, wants to have procedure sooner (advised that there's nothing sooner for [facility]

## 2019-12-27 ENCOUNTER — Ambulatory Visit: Payer: Medicaid Other | Admitting: Gastroenterology

## 2019-12-28 ENCOUNTER — Ambulatory Visit (HOSPITAL_COMMUNITY)
Admission: RE | Admit: 2019-12-28 | Discharge: 2019-12-28 | Disposition: A | Discharge status: Home / Self Care | Payer: Medicaid Other | Source: Ambulatory Visit | Attending: Gastroenterology | Admitting: Gastroenterology

## 2019-12-28 ENCOUNTER — Encounter (HOSPITAL_COMMUNITY): Payer: Self-pay

## 2019-12-28 ENCOUNTER — Other Ambulatory Visit: Payer: Self-pay

## 2019-12-28 DIAGNOSIS — R935 Abnormal findings on diagnostic imaging of other abdominal regions, including retroperitoneum: Secondary | ICD-10-CM

## 2019-12-28 DIAGNOSIS — Z9049 Acquired absence of other specified parts of digestive tract: Secondary | ICD-10-CM | POA: Diagnosis not present

## 2019-12-28 DIAGNOSIS — D72829 Elevated white blood cell count, unspecified: Secondary | ICD-10-CM

## 2019-12-28 DIAGNOSIS — N281 Cyst of kidney, acquired: Secondary | ICD-10-CM | POA: Diagnosis not present

## 2019-12-28 MED ORDER — SODIUM CHLORIDE (PF) 0.9 % IJ SOLN
INTRAMUSCULAR | Status: AC
  Filled 2019-12-28: qty 50

## 2019-12-28 MED ORDER — IOHEXOL 300 MG/ML  SOLN
100.0000 mL | Freq: Once | INTRAMUSCULAR | Status: AC | PRN
  Administered 2019-12-28: 100 mL via INTRAVENOUS

## 2020-01-09 ENCOUNTER — Emergency Department (HOSPITAL_COMMUNITY)
Admission: EM | Admit: 2020-01-09 | Discharge: 2020-01-09 | Disposition: A | Discharge status: Home / Self Care | Payer: Medicaid Other | Attending: Emergency Medicine | Admitting: Emergency Medicine

## 2020-01-09 ENCOUNTER — Other Ambulatory Visit: Payer: Self-pay

## 2020-01-09 DIAGNOSIS — Z5321 Procedure and treatment not carried out due to patient leaving prior to being seen by health care provider: Secondary | ICD-10-CM | POA: Insufficient documentation

## 2020-01-09 DIAGNOSIS — R112 Nausea with vomiting, unspecified: Secondary | ICD-10-CM | POA: Diagnosis not present

## 2020-01-09 DIAGNOSIS — R197 Diarrhea, unspecified: Secondary | ICD-10-CM | POA: Insufficient documentation

## 2020-01-09 DIAGNOSIS — R109 Unspecified abdominal pain: Secondary | ICD-10-CM | POA: Diagnosis not present

## 2020-01-09 LAB — COMPREHENSIVE METABOLIC PANEL
ALT: 24 U/L (ref 0–44)
AST: 22 U/L (ref 15–41)
Albumin: 3.7 g/dL (ref 3.5–5.0)
Alkaline Phosphatase: 107 U/L (ref 38–126)
Anion gap: 12 (ref 5–15)
BUN: 9 mg/dL (ref 6–20)
CO2: 18 mmol/L — ABNORMAL LOW (ref 22–32)
Calcium: 9.1 mg/dL (ref 8.9–10.3)
Chloride: 108 mmol/L (ref 98–111)
Creatinine, Ser: 0.75 mg/dL (ref 0.44–1.00)
GFR, Estimated: >60 mL/min (ref >60)
Glucose, Bld: 97 mg/dL (ref 70–99)
Potassium: 3.7 mmol/L (ref 3.5–5.1)
Sodium: 138 mmol/L (ref 135–145)
Total Bilirubin: 0.7 mg/dL (ref 0.3–1.2)
Total Protein: 7.4 g/dL (ref 6.5–8.1)

## 2020-01-09 LAB — I-STAT BETA HCG BLOOD, ED (MC, WL, AP ONLY): I-stat hCG, quantitative: <5 m[IU]/mL (ref <5)

## 2020-01-09 LAB — CBC
HCT: 41.1 % (ref 36.0–46.0)
Hemoglobin: 13.3 g/dL (ref 12.0–15.0)
MCH: 26 pg (ref 26.0–34.0)
MCHC: 32.4 g/dL (ref 30.0–36.0)
MCV: 80.3 fL (ref 80.0–100.0)
Platelets: 446 10*3/uL — ABNORMAL HIGH (ref 150–400)
RBC: 5.12 MIL/uL — ABNORMAL HIGH (ref 3.87–5.11)
RDW: 15.6 % — ABNORMAL HIGH (ref 11.5–15.5)
WBC: 17 10*3/uL — ABNORMAL HIGH (ref 4.0–10.5)
nRBC: 0 % (ref 0.0–0.2)

## 2020-01-09 LAB — LIPASE, BLOOD: Lipase: 21 U/L (ref 11–51)

## 2020-01-09 MED ORDER — ONDANSETRON 4 MG PO TBDP: 4.0000 mg | ORAL_TABLET | Freq: Once | ORAL | Status: DC | PRN

## 2020-01-09 NOTE — ED Triage Notes (Signed)
Pt POV c/o abdominal pain, n/v/diarrhea starting at 0500 today.  Pt took hyoscyamine today with no relief.

## 2020-01-10 ENCOUNTER — Encounter (HOSPITAL_COMMUNITY): Payer: Self-pay | Admitting: Emergency Medicine

## 2020-01-10 ENCOUNTER — Other Ambulatory Visit: Payer: Self-pay

## 2020-01-10 ENCOUNTER — Observation Stay (HOSPITAL_COMMUNITY)
Admission: EM | Admit: 2020-01-10 | Discharge: 2020-01-11 | Disposition: A | Discharge status: Home / Self Care | Payer: Medicaid Other | Attending: Surgery | Admitting: Surgery

## 2020-01-10 DIAGNOSIS — R109 Unspecified abdominal pain: Secondary | ICD-10-CM | POA: Diagnosis not present

## 2020-01-10 DIAGNOSIS — R1084 Generalized abdominal pain: Secondary | ICD-10-CM | POA: Diagnosis not present

## 2020-01-10 DIAGNOSIS — U071 COVID-19: Secondary | ICD-10-CM | POA: Diagnosis not present

## 2020-01-10 DIAGNOSIS — K3580 Unspecified acute appendicitis: Secondary | ICD-10-CM | POA: Diagnosis not present

## 2020-01-10 LAB — COMPREHENSIVE METABOLIC PANEL
ALT: 23 U/L (ref 0–44)
AST: 19 U/L (ref 15–41)
Albumin: 3.2 g/dL — ABNORMAL LOW (ref 3.5–5.0)
Alkaline Phosphatase: 100 U/L (ref 38–126)
Anion gap: 10 (ref 5–15)
BUN: 6 mg/dL (ref 6–20)
CO2: 22 mmol/L (ref 22–32)
Calcium: 9.1 mg/dL (ref 8.9–10.3)
Chloride: 106 mmol/L (ref 98–111)
Creatinine, Ser: 0.8 mg/dL (ref 0.44–1.00)
GFR, Estimated: >60 mL/min (ref >60)
Glucose, Bld: 89 mg/dL (ref 70–99)
Potassium: 3.7 mmol/L (ref 3.5–5.1)
Sodium: 138 mmol/L (ref 135–145)
Total Bilirubin: 0.5 mg/dL (ref 0.3–1.2)
Total Protein: 6.7 g/dL (ref 6.5–8.1)

## 2020-01-10 LAB — CBC
HCT: 40.5 % (ref 36.0–46.0)
Hemoglobin: 12.5 g/dL (ref 12.0–15.0)
MCH: 25.1 pg — ABNORMAL LOW (ref 26.0–34.0)
MCHC: 30.9 g/dL (ref 30.0–36.0)
MCV: 81.3 fL (ref 80.0–100.0)
Platelets: 366 10*3/uL (ref 150–400)
RBC: 4.98 MIL/uL (ref 3.87–5.11)
RDW: 15.8 % — ABNORMAL HIGH (ref 11.5–15.5)
WBC: 7.3 10*3/uL (ref 4.0–10.5)
nRBC: 0 % (ref 0.0–0.2)

## 2020-01-10 LAB — URINALYSIS, ROUTINE W REFLEX MICROSCOPIC
Bilirubin Urine: NEGATIVE
Glucose, UA: NEGATIVE mg/dL
Hgb urine dipstick: NEGATIVE
Ketones, ur: NEGATIVE mg/dL
Leukocytes,Ua: NEGATIVE
Nitrite: NEGATIVE
Protein, ur: NEGATIVE mg/dL
Specific Gravity, Urine: 1.012 (ref 1.005–1.030)
pH: 8 (ref 5.0–8.0)

## 2020-01-10 LAB — I-STAT BETA HCG BLOOD, ED (MC, WL, AP ONLY): I-stat hCG, quantitative: <5 m[IU]/mL (ref <5)

## 2020-01-10 LAB — LIPASE, BLOOD: Lipase: 20 U/L (ref 11–51)

## 2020-01-10 MED ORDER — HYDROMORPHONE HCL 1 MG/ML IJ SOLN
1.0000 mg | Freq: Once | INTRAMUSCULAR | Status: AC
  Administered 2020-01-11: 1 mg via INTRAVENOUS
  Filled 2020-01-10: qty 1

## 2020-01-10 MED ORDER — ACETAMINOPHEN 500 MG PO TABS
500.0000 mg | ORAL_TABLET | Freq: Once | ORAL | Status: AC
  Administered 2020-01-10: 500 mg via ORAL
  Filled 2020-01-10: qty 1

## 2020-01-10 MED ORDER — ONDANSETRON 4 MG PO TBDP
4.0000 mg | ORAL_TABLET | Freq: Once | ORAL | Status: AC | PRN
  Administered 2020-01-10: 4 mg via ORAL
  Filled 2020-01-10: qty 1

## 2020-01-10 MED ORDER — SODIUM CHLORIDE 0.9 % IV BOLUS
1000.0000 mL | Freq: Once | INTRAVENOUS | Status: AC
  Administered 2020-01-11: 1000 mL via INTRAVENOUS

## 2020-01-10 MED ORDER — IBUPROFEN 400 MG PO TABS
400.0000 mg | ORAL_TABLET | Freq: Once | ORAL | Status: AC | PRN
  Administered 2020-01-10: 400 mg via ORAL
  Filled 2020-01-10 (×2): qty 1

## 2020-01-10 MED ORDER — ONDANSETRON HCL 4 MG/2ML IJ SOLN
4.0000 mg | Freq: Once | INTRAMUSCULAR | Status: AC
  Administered 2020-01-11: 4 mg via INTRAVENOUS
  Filled 2020-01-10: qty 2

## 2020-01-10 NOTE — ED Provider Notes (Signed)
[place] EMERGENCY DEPARTMENT Provider Note   CSN: [id number] Arrival date & time: 01/10/20  1537     History Chief Complaint  Patient presents with  . Abdominal Pain    Stephanie Davies is a 44 y.o. female.  Patient presents to the emergency department for evaluation of nausea, vomiting and diarrhea.  Symptoms began yesterday.  Patient has been experiencing diffuse abdominal cramping associated with the symptoms.  Patient reports that she has nothing left in her stomach, has mostly been dry heaving today.  She did see a small drop of blood in the vomit today.  No rectal bleeding or melena.  She has been running a fever.  No URI symptoms.        Past Medical History:  Diagnosis Date  . Alopecia   . Anal fistula   . Anxiety and depression 03/07/2014  . GERD (gastroesophageal reflux disease) 02/14/2014  . Goiter 03/07/2014  . Headache(784.0)   . Hemorrhoids   . Kidney stone   . Pseudotumor cerebri     Patient Active Problem List   Diagnosis Date Noted  . OSA (obstructive sleep apnea) 12/01/2019  . Obesity, Class III, BMI 40-49.9 (morbid obesity) ([place]) 11/29/2019  . Annual physical exam 11/29/2019  . Abdominal pain 11/03/2019  . IBS (irritable bowel syndrome) 07/23/2019  . History of kidney stones 07/23/2019  . Pseudotumor cerebri   . Anxiety 05/13/2016  . Cough 04/11/2015  . PCP NOTES >>>>>>>>>>>>>>>>>>>>>>>>>>>>>>>>>> 09/20/2014  . Anxiety and depression 03/07/2014  . Goiter 03/07/2014  . Hives 02/20/2014  . GERD (gastroesophageal reflux disease) 02/14/2014  . Upper airway cough syndrome 01/22/2014  . Eczema, allergic 01/09/2014  . Dyspnea 01/09/2014  . Hip pain 10/15/2013  . Pain in joint, pelvic region and thigh 10/08/2013  . External bleeding hemorrhoids 10/08/2013  . Rh negative status during pregnancy 06/06/2013    Past Surgical History:  Procedure Laterality Date  . CHOLECYSTECTOMY  2004  . kidney stent: 12/2018 at [place]        OB History    Gravida  1   Para  1   Term  1   Preterm      AB      Living  1     SAB      IAB      Ectopic      Multiple      Live Births  1           Family History  Problem Relation Age of Onset  . Diabetes Mother   . Hypertension Mother   . Esophageal cancer Maternal Grandmother   . Esophageal cancer Maternal Grandfather   . Diabetes Maternal Grandfather   . Colon cancer Neg Hx   . Colon polyps Neg Hx   . Gallbladder disease Neg Hx   . Kidney disease Neg Hx     Social History   Tobacco Use  . Smoking status: Never Smoker  . Smokeless tobacco: Never Used  Vaping Use  . Vaping Use: Never used  Substance Use Topics  . Alcohol use: No  . Drug use: No    Home Medications Prior to Admission medications   Medication Sig Start Date End Date Taking? Authorizing Provider  AMBULATORY NON FORMULARY MEDICATION Medication Name: Carafate Slurry. Dissolve 1 tablet into a small amount of water and take by mouth qid 12/11/19   Thornton Park, MD  aspirin-acetaminophen-caffeine (EXCEDRIN MIGRAINE) [phone] MG tablet Take 2 tablets by mouth daily.  [provider]  hydrOXYzine (ATARAX/VISTARIL) 10 MG tablet Take 1-2 tablets (10-20 mg total) by mouth 2 (two) times daily as needed (anxiety). 10/25/19   Colon Branch, MD  hyoscyamine (LEVSIN SL) 0.125 MG SL tablet Place 1 tablet (0.125 mg total) under the tongue every 4 (four) hours as needed for cramping. 12/11/19   Thornton Park, MD  ketoconazole (NIZORAL) 2 % cream Apply 1 application topically daily. 11/30/19   Colon Branch, MD  ketoconazole (NIZORAL) 2 % shampoo Apply 1 application topically 2 (two) times a week. 08/13/19   Colon Branch, MD  pantoprazole (PROTONIX) 20 MG tablet Take 20 mg by mouth daily.  11/03/19   [provider]  PROAIR HFA 108 (90 Base) MCG/ACT inhaler Inhale 2 puffs into the lungs every 6 (six) hours as needed for wheezing or shortness of breath. 10/12/19   Colon Branch, MD  sertraline (ZOLOFT) 100 MG tablet 1 tablet daily for 2 weeks, then 1.5 tablets daily Patient taking differently: Take 100 mg by mouth daily. 1 tablet daily for 2 weeks, then 1.5 tablets daily 10/25/19   Colon Branch, MD  SUPREP BOWEL PREP KIT 17.5-3.13-1.6 GM/177ML SOLN Take 1 kit by mouth as directed. For colonoscopy prep BIN: [id number] PCN: CN GROUP: [id number] ID: [id number] 12/11/19   Thornton Park, MD    Allergies    Patient has no known allergies.  Review of Systems   Review of Systems  Constitutional: Positive for chills and fever.  Gastrointestinal: Positive for abdominal pain, diarrhea, nausea and vomiting.  All other systems reviewed and are negative.   Physical Exam Updated Vital Signs BP (!) 113/58   Pulse 85   Temp 98.4 F (36.9 C) (Oral)   Resp (!) 0   Ht $R'5\' 6"'Xj$  (1.676 m)   Wt 127 kg   SpO2 98%   BMI 45.19 kg/m   Physical Exam Vitals and nursing note reviewed.  Constitutional:      General: She is not in acute distress.    Appearance: Normal appearance. She is well-developed and well-nourished.  HENT:     Head: Normocephalic and atraumatic.     Right Ear: Hearing normal.     Left Ear: Hearing normal.     Nose: Nose normal.     Mouth/Throat:     Mouth: Oropharynx is clear and moist and mucous membranes are normal.  Eyes:     Extraocular Movements: EOM normal.     Conjunctiva/sclera: Conjunctivae normal.     Pupils: Pupils are equal, round, and reactive to light.  Cardiovascular:     Rate and Rhythm: Regular rhythm.     Heart sounds: S1 normal and S2 normal. No murmur heard. No friction rub. No gallop.   Pulmonary:     Effort: Pulmonary effort is normal. No respiratory distress.     Breath sounds: Normal breath sounds.  Chest:     Chest wall: No tenderness.  Abdominal:     General: Bowel sounds are normal.     Palpations: Abdomen is soft. There is no hepatosplenomegaly.     Tenderness: There is generalized abdominal tenderness. There is no  guarding or rebound. Negative signs include Murphy's sign and McBurney's sign.     Hernia: No hernia is present.  Musculoskeletal:        General: Normal range of motion.     Cervical back: Normal range of motion and neck supple.  Skin:    General: Skin is warm, dry and intact.  Findings: No rash.     Nails: There is no cyanosis.  Neurological:     Mental Status: She is alert and oriented to person, place, and time.     GCS: GCS eye subscore is 4. GCS verbal subscore is 5. GCS motor subscore is 6.     Cranial Nerves: No cranial nerve deficit.     Sensory: No sensory deficit.     Coordination: Coordination normal.     Deep Tendon Reflexes: Strength normal.  Psychiatric:        Mood and Affect: Mood and affect normal.        Speech: Speech normal.        Behavior: Behavior normal.        Thought Content: Thought content normal.     ED Results / Procedures / Treatments   Labs (all labs ordered are listed, but only abnormal results are displayed) Labs Reviewed  COMPREHENSIVE METABOLIC PANEL - Abnormal; Notable for the following components:      Result Value   Albumin 3.2 (*)    All other components within normal limits  CBC - Abnormal; Notable for the following components:   MCH 25.1 (*)    RDW 15.8 (*)    All other components within normal limits  RESP PANEL BY RT-PCR (FLU A&B, COVID) ARPGX2  GASTROINTESTINAL PANEL BY PCR, STOOL (REPLACES STOOL CULTURE)  C DIFFICILE QUICK SCREEN W PCR REFLEX  LIPASE, BLOOD  URINALYSIS, ROUTINE W REFLEX MICROSCOPIC  I-STAT BETA HCG BLOOD, ED ([facility], [facility], [facility] ONLY)    EKG None  Radiology CT ABDOMEN PELVIS W CONTRAST  Result Date: 01/11/2020 CLINICAL DATA:  Abdominal pain and fever EXAM: CT ABDOMEN AND PELVIS WITH CONTRAST TECHNIQUE: Multidetector CT imaging of the abdomen and pelvis was performed using the standard protocol following bolus administration of intravenous contrast. CONTRAST:  135m OMNIPAQUE IOHEXOL 300 MG/ML  SOLN  COMPARISON:  December 28, 2019 FINDINGS: Lower chest: The visualized heart size within normal limits. No pericardial fluid/thickening. No hiatal hernia. Streaky airspace opacity seen within both lungs. Hepatobiliary: There is a 11 mm low-density lesion seen within the right liver lobe, likely hepatic cyst.The main portal vein is patent. The patient is status post cholecystectomy. No biliary ductal dilation. Pancreas: Unremarkable. No pancreatic ductal dilatation or surrounding inflammatory changes. Spleen: Normal in size without focal abnormality. Adrenals/Urinary Tract: Both adrenal glands appear normal. Bilateral low-density lesions seen within both kidneys the largest measuring 2 cm within the upper pole of the left kidney. There is a punctate calcifications seen in the upper pole the left kidney. Bladder is unremarkable. Stomach/Bowel: The stomach, small bowel, and colon are normal in appearance. No inflammatory changes, wall thickening, or obstructive findings.The appendix is mildly dilated measuring up to 1 cm with minimal surrounding fat stranding changes. There is scattered small lymph nodes seen within the right lower quadrant. Vascular/Lymphatic: There are no enlarged mesenteric, retroperitoneal, or pelvic lymph nodes. No significant vascular findings are present. Reproductive: The uterus and adnexa are unremarkable. Other: No evidence of abdominal wall mass or hernia. Musculoskeletal: No acute or significant osseous findings. IMPRESSION: Findings which may be suggestive of mild acute appendicitis. No surrounding loculated fluid collections or free air. Nonobstructing left renal calculi. Electronically Signed   By: BPrudencio PairM.D.   On: 01/11/2020 02:13    Procedures Procedures (including critical care time)  Medications Ordered in ED Medications  Ampicillin-Sulbactam (UNASYN) 3 g in sodium chloride 0.9 % 100 mL IVPB (has no administration in time  range)  ondansetron (ZOFRAN-ODT) disintegrating  tablet 4 mg (4 mg Oral Given 01/10/20 1548)  acetaminophen (TYLENOL) tablet 500 mg (500 mg Oral Given 01/10/20 1908)  ibuprofen (ADVIL) tablet 400 mg (400 mg Oral Given 01/10/20 2050)  sodium chloride 0.9 % bolus 1,000 mL (0 mLs Intravenous Stopped 01/11/20 0259)  HYDROmorphone (DILAUDID) injection 1 mg (1 mg Intravenous Given 01/11/20 0055)  ondansetron (ZOFRAN) injection 4 mg (4 mg Intravenous Given 01/11/20 0055)  iohexol (OMNIPAQUE) 300 MG/ML solution 100 mL (100 mLs Intravenous Contrast Given 01/11/20 0131)    ED Course  I have reviewed the triage vital signs and the nursing notes.  Pertinent labs & imaging results that were available during my care of the patient were reviewed by me and considered in my medical decision making (see chart for details).    MDM Rules/Calculators/A&P                          Patient presents to the emergency department for evaluation of abdominal pain.  Reports pain in the center of her abdomen and across the lower portion of the abdomen.  She has had nausea, vomiting and diarrhea.  Patient was tri[facility] at [facility] ago and had labs, white blood cell count was markedly elevated at that time.  It appears improved today.  She was febrile at arrival.  She was tachycardic without hypotension.  Tachycardia resolved as the fever came down.  Lab work otherwise unremarkable.  Repeat CT shows findings concerning for mild appendicitis.  Patient was seen with similar findings in October.  She was admitted for IV antibiotics, improved and did not require appendectomy.  She did have a follow-up CT a week and a half ago that did not show these inflammatory changes.  Current findings concerning for recurrent appendicitis.  Discussed with Dr. Zenia Resides, will see patient.  Final Clinical Impression(s) / ED Diagnoses Final diagnoses:  Abdominal pain, unspecified abdominal location    Rx / DC Orders ED Discharge Orders    None       Airon Sahni, Gwenyth Allegra,  MD 01/11/20 [phone]

## 2020-01-10 NOTE — ED Triage Notes (Signed)
Patient here with complaint of abdominal pain, nausea, vomiting, and diarrhea since yesterday. States she was went through triage at [facility] yesterday for same but did not wait to see provider. Patient alert and oriented at this time.

## 2020-01-11 ENCOUNTER — Emergency Department (HOSPITAL_COMMUNITY): Payer: Medicaid Other

## 2020-01-11 ENCOUNTER — Other Ambulatory Visit: Payer: Self-pay

## 2020-01-11 DIAGNOSIS — U071 COVID-19: Secondary | ICD-10-CM | POA: Diagnosis not present

## 2020-01-11 DIAGNOSIS — K3580 Unspecified acute appendicitis: Secondary | ICD-10-CM | POA: Diagnosis present

## 2020-01-11 DIAGNOSIS — R109 Unspecified abdominal pain: Secondary | ICD-10-CM | POA: Diagnosis not present

## 2020-01-11 LAB — RESP PANEL BY RT-PCR (FLU A&B, COVID) ARPGX2
Influenza A by PCR: NEGATIVE
Influenza A by PCR: NEGATIVE
Influenza B by PCR: NEGATIVE
Influenza B by PCR: NEGATIVE
SARS Coronavirus 2 by RT PCR: POSITIVE — AB
SARS Coronavirus 2 by RT PCR: POSITIVE — AB

## 2020-01-11 MED ORDER — LACTATED RINGERS IV SOLN: INTRAVENOUS | Status: DC

## 2020-01-11 MED ORDER — HYDROMORPHONE HCL 1 MG/ML IJ SOLN: 0.5000 mg | INTRAMUSCULAR | Status: DC | PRN

## 2020-01-11 MED ORDER — ENOXAPARIN SODIUM 40 MG/0.4ML ~~LOC~~ SOLN
40.0000 mg | Freq: Every day | SUBCUTANEOUS | Status: DC
  Administered 2020-01-11: 40 mg via SUBCUTANEOUS
  Filled 2020-01-11: qty 0.4

## 2020-01-11 MED ORDER — ACETAMINOPHEN 325 MG PO TABS
650.0000 mg | ORAL_TABLET | Freq: Four times a day (QID) | ORAL | Status: DC
  Administered 2020-01-11: 650 mg via ORAL
  Filled 2020-01-11: qty 2

## 2020-01-11 MED ORDER — ONDANSETRON HCL 4 MG/2ML IJ SOLN: 4.0000 mg | Freq: Four times a day (QID) | INTRAMUSCULAR | Status: DC | PRN

## 2020-01-11 MED ORDER — SODIUM CHLORIDE 0.9 % IV SOLN
3.0000 g | Freq: Four times a day (QID) | INTRAVENOUS | Status: DC
  Administered 2020-01-11 (×3): 3 g via INTRAVENOUS
  Filled 2020-01-11 (×2): qty 8
  Filled 2020-01-11: qty 3
  Filled 2020-01-11 (×2): qty 8

## 2020-01-11 MED ORDER — IOHEXOL 300 MG/ML  SOLN
100.0000 mL | Freq: Once | INTRAMUSCULAR | Status: AC | PRN
  Administered 2020-01-11: 100 mL via INTRAVENOUS

## 2020-01-11 MED ORDER — ONDANSETRON 4 MG PO TBDP: 4.0000 mg | ORAL_TABLET | Freq: Four times a day (QID) | ORAL | Status: DC | PRN

## 2020-01-11 MED ORDER — AMOXICILLIN-POT CLAVULANATE 875-125 MG PO TABS: 1.0000 | ORAL_TABLET | Freq: Two times a day (BID) | ORAL | 0 refills | Status: AC

## 2020-01-11 MED ORDER — HYDROMORPHONE HCL 1 MG/ML IJ SOLN
1.0000 mg | Freq: Once | INTRAMUSCULAR | Status: AC
  Administered 2020-01-11: 1 mg via INTRAVENOUS
  Filled 2020-01-11: qty 1

## 2020-01-11 MED ORDER — OXYCODONE HCL 5 MG PO TABS: 5.0000 mg | ORAL_TABLET | ORAL | Status: DC | PRN

## 2020-01-11 NOTE — ED Notes (Signed)
Visually checked on Pt pt stated that she felt fine

## 2020-01-11 NOTE — ED Notes (Signed)
Pt discharged ambulatory. All questions and concerns addressed. No complaints at this time.   

## 2020-01-11 NOTE — ED Notes (Signed)
Main pharmacy contacted to check status of Unasyn. Per pharmacy, medications being sorted and sent now.

## 2020-01-11 NOTE — ED Notes (Signed)
Pt and husband requesting repeat COVID swab. Husband reports if she was positive then he would be. Pt husband had negative covid result. Educated pt on likely hood of false positive r/t to pt being symptomatic.   Contacted provider and repeat swab ordered along with tylenol for pt HA.   Pt updated on plan of care.

## 2020-01-11 NOTE — Discharge Summary (Signed)
Patient ID: Stephanie Davies [id number] 44 y.o. 11-Jul-1975  01/10/2020  Discharge date and time: 01/11/2020  Admitting Physician: Poston  Discharge Physician: Watha  Admission Diagnoses: Appendicitis, acute [K35.80] Patient Active Problem List   Diagnosis Date Noted  . Appendicitis, acute 01/11/2020  . OSA (obstructive sleep apnea) 12/01/2019  . Obesity, Class III, BMI 40-49.9 (morbid obesity) ([place]) 11/29/2019  . Annual physical exam 11/29/2019  . Abdominal pain 11/03/2019  . IBS (irritable bowel syndrome) 07/23/2019  . History of kidney stones 07/23/2019  . Pseudotumor cerebri   . Anxiety 05/13/2016  . Cough 04/11/2015  . PCP NOTES >>>>>>>>>>>>>>>>>>>>>>>>>>>>>>>>>> 09/20/2014  . Anxiety and depression 03/07/2014  . Goiter 03/07/2014  . Hives 02/20/2014  . GERD (gastroesophageal reflux disease) 02/14/2014  . Upper airway cough syndrome 01/22/2014  . Eczema, allergic 01/09/2014  . Dyspnea 01/09/2014  . Hip pain 10/15/2013  . Pain in joint, pelvic region and thigh 10/08/2013  . External bleeding hemorrhoids 10/08/2013  . Rh negative status during pregnancy 06/06/2013     Discharge Diagnoses: COVID 19 Patient Active Problem List   Diagnosis Date Noted  . Appendicitis, acute 01/11/2020  . OSA (obstructive sleep apnea) 12/01/2019  . Obesity, Class III, BMI 40-49.9 (morbid obesity) ([place]) 11/29/2019  . Annual physical exam 11/29/2019  . Abdominal pain 11/03/2019  . IBS (irritable bowel syndrome) 07/23/2019  . History of kidney stones 07/23/2019  . Pseudotumor cerebri   . Anxiety 05/13/2016  . Cough 04/11/2015  . PCP NOTES >>>>>>>>>>>>>>>>>>>>>>>>>>>>>>>>>> 09/20/2014  . Anxiety and depression 03/07/2014  . Goiter 03/07/2014  . Hives 02/20/2014  . GERD (gastroesophageal reflux disease) 02/14/2014  . Upper airway cough syndrome 01/22/2014  . Eczema, allergic 01/09/2014  . Dyspnea 01/09/2014  . Hip pain 10/15/2013  . Pain in joint,  pelvic region and thigh 10/08/2013  . External bleeding hemorrhoids 10/08/2013  . Rh negative status during pregnancy 06/06/2013    Operations:   Admission Condition: good  Discharged Condition: good  Indication for Admission: Possible appendicitis  Hospital Course: Ms. Winnett recently had appendicitis and was treated with antibiotics.  She developed a fever and has been having epigastric abdominal pain so she presented to the hospital for evaluation.  A CT scan demonstrated some dilation of the appendix, however clinically her pain is more epigastric.  She was observed in the ER throughout the day and her pain improved so she was discharged home on a short course antibiotics.  We recommend proceeding with the plan of a colonoscopy with GI, and the surgery team will be available for interval appendectomy to follow.  Consults: None  Significant Diagnostic Studies: Abdomen pelvis CT scan  Treatments: Antibiotics  Disposition: Home  Patient Instructions:  Allergies as of 01/11/2020   No Known Allergies     Medication List    TAKE these medications   AMBULATORY NON FORMULARY MEDICATION Medication Name: Carafate Slurry. Dissolve 1 tablet into a small amount of water and take by mouth qid   amoxicillin-clavulanate 875-125 MG tablet Commonly known as: Augmentin Take 1 tablet by mouth 2 (two) times daily for 14 days.   aspirin-acetaminophen-caffeine 465-035-46 MG tablet Commonly known as: EXCEDRIN MIGRAINE Take 2 tablets by mouth as needed for migraine.   hydrOXYzine 10 MG tablet Commonly known as: ATARAX/VISTARIL Take 1-2 tablets (10-20 mg total) by mouth 2 (two) times daily as needed (anxiety). What changed: reasons to take this   hyoscyamine 0.125 MG SL tablet Commonly known as: LEVSIN SL Place 1 tablet (0.125  mg total) under the tongue every 4 (four) hours as needed for cramping.   ketoconazole 2 % shampoo Commonly known as: NIZORAL Apply 1 application topically 2  (two) times a week.   ketoconazole 2 % cream Commonly known as: NIZORAL Apply 1 application topically daily.   pantoprazole 20 MG tablet Commonly known as: PROTONIX Take 20 mg by mouth daily.   ProAir HFA 108 (90 Base) MCG/ACT inhaler Generic drug: albuterol Inhale 2 puffs into the lungs every 6 (six) hours as needed for wheezing or shortness of breath.   sertraline 100 MG tablet Commonly known as: Zoloft 1 tablet daily for 2 weeks, then 1.5 tablets daily What changed:   how much to take  how to take this  when to take this  additional instructions   Suprep Bowel Prep Kit 17.5-3.13-1.6 GM/177ML Soln Generic drug: Na Sulfate-K Sulfate-Mg Sulf Take 1 kit by mouth as directed. For colonoscopy prep BIN: [id number] PCN: CN GROUP: [id number] ID: [id number]       Activity: activity as tolerated Diet: regular diet Wound Care: none needed  Follow-up:  With general surgery after your colonoscopy to schedule interval appendectomy   Signed: [facility], Bariatric, & Minimally Invasive Surgery [facility]   01/11/2020, 4:59 PM

## 2020-01-11 NOTE — ED Notes (Signed)
Contacted provider r/t pt wanting to go home if provider thinks it is safe to do so. Provider to review case and update plan of care.

## 2020-01-11 NOTE — H&P (Signed)
Stephanie Davies [birth year]  [id number].    Requesting MD: Dr. Blinda Leatherwood Chief Complaint/Reason for Consult: appendicitis  HPI:  Stephanie Davies is a 44 yo female who presents with abdominal pain and diarrhea. In October of this year she was admitted at [facility] with epigastric abdominal pain and CT findings of early appendicitis. She was treated with IV antibiotics and symptoms improved. Since her pain was atypical for appendicitis, she was managed nonoperatively and was scheduled for outpatient follow up (she has an appointment in our office next month). For the last few months she has been having chronic left flank pain and upper abdominal pain, with alternating diarrhea and constipation. She was seen by GI on 11/30 and will be undergoing a colonoscopy in January. She has no family history of colon cancer or IBD.  Yesterday she began having severe upper abdominal pain, which she says feels the same as the pain she had when she was admitted in October. It is more on the right side. She reports a fever to 103 today, as well as diarrhea. WBC is normal. COVID screening test in the ED was positive. Patient denies any shortness of breath or known contacts with COVID. CT scan showed a mildly dilated fluid-filled appendix, concerning for early appendicitis.  ROS: Review of Systems  Constitutional: Positive for fever.  Respiratory: Negative for cough, shortness of breath and wheezing.   Cardiovascular: Negative for chest pain.  Gastrointestinal: Positive for abdominal pain and diarrhea. Negative for blood in stool.  Skin: Negative for rash.  Neurological: Negative for speech change and focal weakness.  Psychiatric/Behavioral: The patient does not have insomnia.     Family History  Problem Relation Age of Onset  . Diabetes Mother   . Hypertension Mother   . Esophageal cancer Maternal Grandmother   . Esophageal cancer Maternal Grandfather   . Diabetes Maternal Grandfather   . Colon cancer  Neg Hx   . Colon polyps Neg Hx   . Gallbladder disease Neg Hx   . Kidney disease Neg Hx     Past Medical History:  Diagnosis Date  . Alopecia   . Anal fistula   . Anxiety and depression 03/07/2014  . GERD (gastroesophageal reflux disease) 02/14/2014  . Goiter 03/07/2014  . Headache(784.0)   . Hemorrhoids   . Kidney stone   . Pseudotumor cerebri     Past Surgical History:  Procedure Laterality Date  . CHOLECYSTECTOMY  2004  . kidney stent: 12/2018 at [facility]      Social History:  reports that she has never smoked. She has never used smokeless tobacco. She reports that she does not drink alcohol and does not use drugs.  Allergies: No Known Allergies  (Not in a hospital admission)    Physical Exam: Blood pressure 122/75, pulse 84, temperature 98.4 F (36.9 C), temperature source Oral, resp. rate (!) 24, height 5\' 6"  (1.676 m), weight 127 kg, SpO2 98 %. General: resting comfortably, appears stated age, no apparent distress Neurological: alert and oriented, no focal deficits, cranial nerves grossly in tact HEENT: normocephalic, atraumatic, oropharynx clear, no scleral icterus CV: RRR, extremities warm and well-perfused Respiratory: normal work of breathing,symmetric chest wall expansion Abdomen: soft, nondistended, tender to palpation in RUQ and right flank. No masses or organomegaly. Well-healed port site surgical scars. Extremities: warm and well-perfused, no deformities, moving all extremities spontaneously Psychiatric: normal mood and affect Skin: warm and dry, no jaundice, no rashes or lesions   Results for orders placed or  performed during the hospital encounter of 01/10/20 (from the past 48 hour(s))  Lipase, blood     Status: None   Collection Time: 01/10/20  3:48 PM  Result Value Ref Range   Lipase 20 11 - 51 U/L    Comment: Performed at [facility], [address] [place], [place] [address]  Comprehensive metabolic panel     Status: Abnormal   Collection  Time: 01/10/20  3:48 PM  Result Value Ref Range   Sodium 138 135 - 145 mmol/L   Potassium 3.7 3.5 - 5.1 mmol/L   Chloride 106 98 - 111 mmol/L   CO2 22 22 - 32 mmol/L   Glucose, Bld 89 70 - 99 mg/dL    Comment: Glucose reference range applies only to samples taken after fasting for at least 8 hours.   BUN 6 6 - 20 mg/dL   Creatinine, Ser 1.020.80 0.44 - 1.00 mg/dL   Calcium 9.1 8.9 - 72.510.3 mg/dL   Total Protein 6.7 6.5 - 8.1 g/dL   Albumin 3.2 (L) 3.5 - 5.0 g/dL   AST 19 15 - 41 U/L   ALT 23 0 - 44 U/L   Alkaline Phosphatase 100 38 - 126 U/L   Total Bilirubin 0.5 0.3 - 1.2 mg/dL   GFR, Estimated >36>60 >64>60 mL/min    Comment: (NOTE) Calculated using the CKD-EPI Creatinine Equation (2021)    Anion gap 10 5 - 15    Comment: Performed at [facility], [address] [place], [place] [address]  CBC     Status: Abnormal   Collection Time: 01/10/20  3:48 PM  Result Value Ref Range   WBC 7.3 4.0 - 10.5 K/uL   RBC 4.98 3.87 - 5.11 MIL/uL   Hemoglobin 12.5 12.0 - 15.0 g/dL   HCT 42.540.5 95.636.0 - 38.746.0 %   MCV 81.3 80.0 - 100.0 fL   MCH 25.1 (L) 26.0 - 34.0 pg   MCHC 30.9 30.0 - 36.0 g/dL   RDW 56.415.8 (H) 33.211.5 - 95.115.5 %   Platelets 366 150 - 400 K/uL   nRBC 0.0 0.0 - 0.2 %    Comment: Performed at [facility], [address] [place], [place] [address]  I-Stat beta hCG blood, ED     Status: None   Collection Time: 01/10/20  4:16 PM  Result Value Ref Range   I-stat hCG, quantitative <5.0 <5 mIU/mL   Comment 3            Comment:   GEST. AGE      CONC.  (mIU/mL)   <=1 WEEK        5 - 50     2 WEEKS       50 - 500     3 WEEKS       100 - 10,000     4 WEEKS     1,000 - 30,000        FEMALE AND NON-PREGNANT FEMALE:     LESS THAN 5 mIU/mL   Urinalysis, Routine w reflex microscopic Urine, Clean Catch     Status: None   Collection Time: 01/10/20  5:45 PM  Result Value Ref Range   Color, Urine YELLOW YELLOW   APPearance CLEAR CLEAR   Specific Gravity, Urine 1.012 1.005 - 1.030   pH 8.0  5.0 - 8.0   Glucose, UA NEGATIVE NEGATIVE mg/dL   Hgb urine dipstick NEGATIVE NEGATIVE   Bilirubin Urine NEGATIVE NEGATIVE  Ketones, ur NEGATIVE NEGATIVE mg/dL   Protein, ur NEGATIVE NEGATIVE mg/dL   Nitrite NEGATIVE NEGATIVE   Leukocytes,Ua NEGATIVE NEGATIVE    Comment: Performed at [facility], [address], [place], [place] [address]  Resp Panel by RT-PCR (Flu A&B, Covid) Nasopharyngeal Swab     Status: Abnormal   Collection Time: 01/11/20  1:02 AM   Specimen: Nasopharyngeal Swab; Nasopharyngeal(NP) swabs in vial transport medium  Result Value Ref Range   SARS Coronavirus 2 by RT PCR POSITIVE (A) NEGATIVE    Comment: RESULT CALLED TO, READ BACK BY AND VERIFIED WITH: A OAKLEY RN 01/11/20 0335 JDW (NOTE) SARS-CoV-2 target nucleic acids are DETECTED.  The SARS-CoV-2 RNA is generally detectable in upper respiratory specimens during the acute phase of infection. Positive results are indicative of the presence of the identified virus, but do not rule out bacterial infection or co-infection with other pathogens not detected by the test. Clinical correlation with patient history and other diagnostic information is necessary to determine patient infection status. The expected result is Negative.  Fact Sheet for Patients: BloggerCourse.com  Fact Sheet for Healthcare Providers: SeriousBroker.it  This test is not yet approved or cleared by the [place] FDA and  has been authorized for detection and/or diagnosis of SARS-CoV-2 by FDA under an Emergency Use Authorization (EUA).  This EUA will remain in effect (meaning this test can be use d) for the duration of  the COVID-19 declaration under Section 564(b)(1) of the Act, 21 U.S.C. section 360bbb-3(b)(1), unless the authorization is terminated or revoked sooner.     Influenza A by PCR NEGATIVE NEGATIVE   Influenza B by PCR NEGATIVE NEGATIVE    Comment: (NOTE) The Xpert  Xpress SARS-CoV-2/FLU/RSV plus assay is intended as an aid in the diagnosis of influenza from Nasopharyngeal swab specimens and should not be used as a sole basis for treatment. Nasal washings and aspirates are unacceptable for Xpert Xpress SARS-CoV-2/FLU/RSV testing.  Fact Sheet for Patients: BloggerCourse.com  Fact Sheet for Healthcare Providers: SeriousBroker.it  This test is not yet approved or cleared by the [place] FDA and has been authorized for detection and/or diagnosis of SARS-CoV-2 by FDA under an Emergency Use Authorization (EUA). This EUA will remain in effect (meaning this test can be used) for the duration of the COVID-19 declaration under Section 564(b)(1) of the Act, 21 U.S.C. section 360bbb-3(b)(1), unless the authorization is terminated or revoked.  Performed at [facility], [address] [place], [place] [address]    CT ABDOMEN PELVIS W CONTRAST  Result Date: 01/11/2020 CLINICAL DATA:  Abdominal pain and fever EXAM: CT ABDOMEN AND PELVIS WITH CONTRAST TECHNIQUE: Multidetector CT imaging of the abdomen and pelvis was performed using the standard protocol following bolus administration of intravenous contrast. CONTRAST:  OMNIPAQUE IOHEXOL 300 MG/ML  SOLN COMPARISON:  December 28, 2019 FINDINGS: Lower chest: The visualized heart size within normal limits. No pericardial fluid/thickening. No hiatal hernia. Streaky airspace opacity seen within both lungs. Hepatobiliary: There is a 11 mm low-density lesion seen within the right liver lobe, likely hepatic cyst.The main portal vein is patent. The patient is status post cholecystectomy. No biliary ductal dilation. Pancreas: Unremarkable. No pancreatic ductal dilatation or surrounding inflammatory changes. Spleen: Normal in size without focal abnormality. Adrenals/Urinary Tract: Both adrenal glands appear normal. Bilateral low-density lesions seen within both  kidneys the largest measuring 2 cm within the upper pole of the left kidney. There is a punctate calcifications seen in the upper pole  the left kidney. Bladder is unremarkable. Stomach/Bowel: The stomach, small bowel, and colon are normal in appearance. No inflammatory changes, wall thickening, or obstructive findings.The appendix is mildly dilated measuring up to 1 cm with minimal surrounding fat stranding changes. There is scattered small lymph nodes seen within the right lower quadrant. Vascular/Lymphatic: There are no enlarged mesenteric, retroperitoneal, or pelvic lymph nodes. No significant vascular findings are present. Reproductive: The uterus and adnexa are unremarkable. Other: No evidence of abdominal wall mass or hernia. Musculoskeletal: No acute or significant osseous findings. IMPRESSION: Findings which may be suggestive of mild acute appendicitis. No surrounding loculated fluid collections or free air. Nonobstructing left renal calculi. Electronically Signed   By: Jonna Clark M.D.   On: 01/11/2020 02:13      Assessment/Plan 44 yo female presenting with acute on chronic abdominal pain, with CT findings concerning for early appendicitis. I reviewed her current CT and prior scans. Today her appendix is mildly dilated and fluid-filled, similar in appearance to her scan in October. Her pain is interestingly in the RUQ, which is atypical for appendicitis. Her appendix is in the expected position in the RLQ on imaging. She has also had intermittent diarrhea for several months now and is undergoing outpatient workup by GI. COVID test was positive in the ED, she has no respiratory symptoms. GI symptoms could be attributed to COVID but she has had intermittent pain and diarrhea for months so this seems less likely.  I discussed appendectomy with the patient given she has now had 2 scans concerning for early appendicitis, however during my discussion her COVID test came back positive. Since her symptoms  are not definitively attributable to appendicitis and she is stable, I think she should be managed nonoperatively until her COVID resolves. In the meantime she can proceed with colonoscopy for workup of her chronic pain and diarrhea.   - NPO, IV fluids - IV antibiotics - Pain and nausea control - GI PCR panel pending - VTE: lovenox, SCDs - Dispo: admit to observation  Sophronia Simas, MD [facility] General, Hepatobiliary and Pancreatic Surgery 01/11/20 4:16 AM

## 2020-01-14 ENCOUNTER — Telehealth: Payer: Self-pay

## 2020-01-14 NOTE — Telephone Encounter (Addendum)
Transition Care Management Unsuccessful Follow-up Telephone Call  Date of discharge and from where:  [facility] 01/11/2020  Attempts:  1st Attempt  Reason for unsuccessful TCM follow-up call:  Left voice message    

## 2020-01-16 NOTE — Telephone Encounter (Signed)
Transition Care Management Unsuccessful Follow-up Telephone Call  Date of discharge and from where:  Stephanie Davies 01/11/2020  Attempts:  2nd Attempt  Reason for unsuccessful TCM follow-up call:  Left voice message  Patient has gastroenterology appointment on 01/28/2020 at [facility] with Raeanne Barry. Closing encounter.

## 2020-01-18 ENCOUNTER — Ambulatory Visit: Payer: Medicaid Other

## 2020-01-28 ENCOUNTER — Encounter: Payer: Medicaid Other | Admitting: Gastroenterology

## 2020-03-04 ENCOUNTER — Other Ambulatory Visit: Payer: Self-pay

## 2020-03-04 DIAGNOSIS — K3580 Unspecified acute appendicitis: Principal | ICD-10-CM | POA: Insufficient documentation

## 2020-03-04 DIAGNOSIS — U071 COVID-19: Secondary | ICD-10-CM | POA: Diagnosis not present

## 2020-03-04 DIAGNOSIS — R1012 Left upper quadrant pain: Secondary | ICD-10-CM | POA: Diagnosis present

## 2020-03-04 DIAGNOSIS — Z7982 Long term (current) use of aspirin: Secondary | ICD-10-CM | POA: Diagnosis not present

## 2020-03-04 DIAGNOSIS — R1031 Right lower quadrant pain: Secondary | ICD-10-CM | POA: Diagnosis not present

## 2020-03-04 DIAGNOSIS — R1084 Generalized abdominal pain: Secondary | ICD-10-CM | POA: Diagnosis not present

## 2020-03-04 DIAGNOSIS — R112 Nausea with vomiting, unspecified: Secondary | ICD-10-CM | POA: Diagnosis not present

## 2020-03-04 DIAGNOSIS — I10 Essential (primary) hypertension: Secondary | ICD-10-CM | POA: Diagnosis not present

## 2020-03-04 DIAGNOSIS — R1111 Vomiting without nausea: Secondary | ICD-10-CM | POA: Diagnosis not present

## 2020-03-05 ENCOUNTER — Encounter (HOSPITAL_COMMUNITY)
Admission: EM | Disposition: A | Discharge status: Home / Self Care | Payer: Self-pay | Source: Home / Self Care | Attending: Emergency Medicine

## 2020-03-05 ENCOUNTER — Observation Stay (HOSPITAL_COMMUNITY)
Admission: EM | Admit: 2020-03-05 | Discharge: 2020-03-06 | Disposition: A | Discharge status: Home / Self Care | Payer: Medicaid Other | Attending: Surgery | Admitting: Surgery

## 2020-03-05 ENCOUNTER — Emergency Department (HOSPITAL_COMMUNITY): Payer: Medicaid Other

## 2020-03-05 ENCOUNTER — Encounter (HOSPITAL_COMMUNITY): Payer: Self-pay

## 2020-03-05 ENCOUNTER — Observation Stay (HOSPITAL_COMMUNITY): Payer: Medicaid Other | Admitting: Certified Registered"

## 2020-03-05 DIAGNOSIS — U071 COVID-19: Secondary | ICD-10-CM | POA: Diagnosis not present

## 2020-03-05 DIAGNOSIS — K3580 Unspecified acute appendicitis: Secondary | ICD-10-CM | POA: Diagnosis present

## 2020-03-05 DIAGNOSIS — F418 Other specified anxiety disorders: Secondary | ICD-10-CM | POA: Diagnosis not present

## 2020-03-05 DIAGNOSIS — G4733 Obstructive sleep apnea (adult) (pediatric): Secondary | ICD-10-CM | POA: Diagnosis not present

## 2020-03-05 DIAGNOSIS — Z7982 Long term (current) use of aspirin: Secondary | ICD-10-CM | POA: Diagnosis not present

## 2020-03-05 DIAGNOSIS — R1031 Right lower quadrant pain: Secondary | ICD-10-CM | POA: Diagnosis not present

## 2020-03-05 DIAGNOSIS — K219 Gastro-esophageal reflux disease without esophagitis: Secondary | ICD-10-CM | POA: Diagnosis not present

## 2020-03-05 HISTORY — PX: LAPAROSCOPIC APPENDECTOMY: SHX408

## 2020-03-05 LAB — COMPREHENSIVE METABOLIC PANEL
ALT: 21 U/L (ref 0–44)
AST: 21 U/L (ref 15–41)
Albumin: 3.7 g/dL (ref 3.5–5.0)
Alkaline Phosphatase: 101 U/L (ref 38–126)
Anion gap: 12 (ref 5–15)
BUN: 8 mg/dL (ref 6–20)
CO2: 19 mmol/L — ABNORMAL LOW (ref 22–32)
Calcium: 9.3 mg/dL (ref 8.9–10.3)
Chloride: 107 mmol/L (ref 98–111)
Creatinine, Ser: 0.81 mg/dL (ref 0.44–1.00)
GFR, Estimated: >60 mL/min (ref >60)
Glucose, Bld: 112 mg/dL — ABNORMAL HIGH (ref 70–99)
Potassium: 4 mmol/L (ref 3.5–5.1)
Sodium: 138 mmol/L (ref 135–145)
Total Bilirubin: 0.8 mg/dL (ref 0.3–1.2)
Total Protein: 7.4 g/dL (ref 6.5–8.1)

## 2020-03-05 LAB — RESP PANEL BY RT-PCR (FLU A&B, COVID) ARPGX2
Influenza A by PCR: NEGATIVE
Influenza B by PCR: NEGATIVE
SARS Coronavirus 2 by RT PCR: POSITIVE — AB

## 2020-03-05 LAB — CBC WITH DIFFERENTIAL/PLATELET
Abs Immature Granulocytes: 0.07 10*3/uL (ref 0.00–0.07)
Basophils Absolute: 0.1 10*3/uL (ref 0.0–0.1)
Basophils Relative: 0 %
Eosinophils Absolute: 0 10*3/uL (ref 0.0–0.5)
Eosinophils Relative: 0 %
HCT: 41.1 % (ref 36.0–46.0)
Hemoglobin: 13.2 g/dL (ref 12.0–15.0)
Immature Granulocytes: 0 %
Lymphocytes Relative: 6 %
Lymphs Abs: 1.2 10*3/uL (ref 0.7–4.0)
MCH: 25.5 pg — ABNORMAL LOW (ref 26.0–34.0)
MCHC: 32.1 g/dL (ref 30.0–36.0)
MCV: 79.5 fL — ABNORMAL LOW (ref 80.0–100.0)
Monocytes Absolute: 0.6 10*3/uL (ref 0.1–1.0)
Monocytes Relative: 3 %
Neutro Abs: 16.9 10*3/uL — ABNORMAL HIGH (ref 1.7–7.7)
Neutrophils Relative %: 91 %
Platelets: 432 10*3/uL — ABNORMAL HIGH (ref 150–400)
RBC: 5.17 MIL/uL — ABNORMAL HIGH (ref 3.87–5.11)
RDW: 16 % — ABNORMAL HIGH (ref 11.5–15.5)
WBC: 18.8 10*3/uL — ABNORMAL HIGH (ref 4.0–10.5)
nRBC: 0 % (ref 0.0–0.2)

## 2020-03-05 LAB — URINALYSIS, ROUTINE W REFLEX MICROSCOPIC
Bilirubin Urine: NEGATIVE
Glucose, UA: NEGATIVE mg/dL
Ketones, ur: NEGATIVE mg/dL
Leukocytes,Ua: NEGATIVE
Nitrite: NEGATIVE
Protein, ur: NEGATIVE mg/dL
Specific Gravity, Urine: 1.008 (ref 1.005–1.030)
pH: 8 (ref 5.0–8.0)

## 2020-03-05 LAB — I-STAT BETA HCG BLOOD, ED (MC, WL, AP ONLY): I-stat hCG, quantitative: <5 m[IU]/mL (ref <5)

## 2020-03-05 LAB — LIPASE, BLOOD: Lipase: 22 U/L (ref 11–51)

## 2020-03-05 SURGERY — APPENDECTOMY, LAPAROSCOPIC
Anesthesia: General | Site: Abdomen

## 2020-03-05 MED ORDER — CEFAZOLIN SODIUM-DEXTROSE 2-4 GM/100ML-% IV SOLN
INTRAVENOUS | Status: AC
  Filled 2020-03-05: qty 100

## 2020-03-05 MED ORDER — MIDAZOLAM HCL 2 MG/2ML IJ SOLN
INTRAMUSCULAR | Status: AC
  Filled 2020-03-05: qty 2

## 2020-03-05 MED ORDER — ONDANSETRON HCL 4 MG/2ML IJ SOLN
INTRAMUSCULAR | Status: DC | PRN
  Administered 2020-03-05: 4 mg via INTRAVENOUS

## 2020-03-05 MED ORDER — OXYCODONE HCL 5 MG PO TABS
5.0000 mg | ORAL_TABLET | ORAL | Status: DC | PRN
  Administered 2020-03-06 (×2): 5 mg via ORAL
  Filled 2020-03-05 (×2): qty 1

## 2020-03-05 MED ORDER — ROCURONIUM BROMIDE 10 MG/ML (PF) SYRINGE
PREFILLED_SYRINGE | INTRAVENOUS | Status: DC | PRN
  Administered 2020-03-05: 50 mg via INTRAVENOUS

## 2020-03-05 MED ORDER — SERTRALINE HCL 100 MG PO TABS
150.0000 mg | ORAL_TABLET | Freq: Every day | ORAL | Status: DC
  Administered 2020-03-06: 09:00:00 150 mg via ORAL
  Filled 2020-03-05: qty 1

## 2020-03-05 MED ORDER — BUPIVACAINE-EPINEPHRINE (PF) 0.25% -1:200000 IJ SOLN
INTRAMUSCULAR | Status: AC
  Filled 2020-03-05: qty 30

## 2020-03-05 MED ORDER — SUGAMMADEX SODIUM 500 MG/5ML IV SOLN
INTRAVENOUS | Status: DC | PRN
  Administered 2020-03-05: 350 mg via INTRAVENOUS

## 2020-03-05 MED ORDER — SODIUM CHLORIDE 0.9 % IR SOLN
Status: DC | PRN
  Administered 2020-03-05: 1000 mL

## 2020-03-05 MED ORDER — GABAPENTIN 300 MG PO CAPS
300.0000 mg | ORAL_CAPSULE | Freq: Three times a day (TID) | ORAL | Status: DC
  Administered 2020-03-05 – 2020-03-06 (×2): 300 mg via ORAL
  Filled 2020-03-05 (×2): qty 1

## 2020-03-05 MED ORDER — ACETAMINOPHEN 325 MG PO TABS
650.0000 mg | ORAL_TABLET | Freq: Four times a day (QID) | ORAL | Status: DC
  Administered 2020-03-06 (×2): 650 mg via ORAL
  Filled 2020-03-05 (×2): qty 2

## 2020-03-05 MED ORDER — METOPROLOL TARTRATE 5 MG/5ML IV SOLN
5.0000 mg | Freq: Four times a day (QID) | INTRAVENOUS | Status: DC | PRN
  Filled 2020-03-05: qty 5

## 2020-03-05 MED ORDER — ENOXAPARIN SODIUM 40 MG/0.4ML ~~LOC~~ SOLN
40.0000 mg | SUBCUTANEOUS | Status: DC
  Administered 2020-03-06: 40 mg via SUBCUTANEOUS
  Filled 2020-03-05: qty 0.4

## 2020-03-05 MED ORDER — OXYCODONE HCL 5 MG PO TABS
5.0000 mg | ORAL_TABLET | Freq: Once | ORAL | Status: AC | PRN
  Administered 2020-03-05: 5 mg via ORAL

## 2020-03-05 MED ORDER — HYDROMORPHONE HCL 1 MG/ML IJ SOLN: 0.5000 mg | INTRAMUSCULAR | Status: DC | PRN

## 2020-03-05 MED ORDER — OXYCODONE HCL 5 MG PO TABS
10.0000 mg | ORAL_TABLET | ORAL | Status: DC | PRN
  Administered 2020-03-06: 10 mg via ORAL
  Filled 2020-03-05: qty 2

## 2020-03-05 MED ORDER — FENTANYL CITRATE (PF) 250 MCG/5ML IJ SOLN
INTRAMUSCULAR | Status: AC
  Filled 2020-03-05: qty 5

## 2020-03-05 MED ORDER — DIPHENHYDRAMINE HCL 25 MG PO CAPS: 25.0000 mg | ORAL_CAPSULE | Freq: Four times a day (QID) | ORAL | Status: DC | PRN

## 2020-03-05 MED ORDER — SIMETHICONE 80 MG PO CHEW: 40.0000 mg | CHEWABLE_TABLET | Freq: Four times a day (QID) | ORAL | Status: DC | PRN

## 2020-03-05 MED ORDER — DIPHENHYDRAMINE HCL 50 MG/ML IJ SOLN: 25.0000 mg | Freq: Four times a day (QID) | INTRAMUSCULAR | Status: DC | PRN

## 2020-03-05 MED ORDER — CEFAZOLIN SODIUM-DEXTROSE 2-3 GM-%(50ML) IV SOLR
INTRAVENOUS | Status: DC | PRN
  Administered 2020-03-05: 2 g via INTRAVENOUS

## 2020-03-05 MED ORDER — LACTATED RINGERS IV BOLUS
1000.0000 mL | Freq: Once | INTRAVENOUS | Status: AC
  Administered 2020-03-05: 1000 mL via INTRAVENOUS

## 2020-03-05 MED ORDER — FENTANYL CITRATE (PF) 250 MCG/5ML IJ SOLN
INTRAMUSCULAR | Status: DC | PRN
  Administered 2020-03-05 (×3): 50 ug via INTRAVENOUS
  Administered 2020-03-05: 100 ug via INTRAVENOUS

## 2020-03-05 MED ORDER — ACETAMINOPHEN 325 MG PO TABS: 650.0000 mg | ORAL_TABLET | Freq: Four times a day (QID) | ORAL | Status: DC | PRN

## 2020-03-05 MED ORDER — ACETAMINOPHEN 500 MG PO TABS
1000.0000 mg | ORAL_TABLET | ORAL | Status: AC
  Administered 2020-03-05: 1000 mg via ORAL
  Filled 2020-03-05: qty 2

## 2020-03-05 MED ORDER — OXYCODONE HCL 5 MG PO TABS
ORAL_TABLET | ORAL | Status: AC
  Filled 2020-03-05: qty 1

## 2020-03-05 MED ORDER — FENTANYL CITRATE (PF) 100 MCG/2ML IJ SOLN
25.0000 ug | INTRAMUSCULAR | Status: DC | PRN
  Administered 2020-03-05: 50 ug via INTRAVENOUS

## 2020-03-05 MED ORDER — MORPHINE SULFATE (PF) 4 MG/ML IV SOLN
4.0000 mg | Freq: Once | INTRAVENOUS | Status: AC
  Administered 2020-03-05: 4 mg via INTRAVENOUS
  Filled 2020-03-05: qty 1

## 2020-03-05 MED ORDER — SUCCINYLCHOLINE CHLORIDE 200 MG/10ML IV SOSY
PREFILLED_SYRINGE | INTRAVENOUS | Status: AC
  Filled 2020-03-05: qty 10

## 2020-03-05 MED ORDER — ONDANSETRON HCL 4 MG/2ML IJ SOLN
4.0000 mg | Freq: Four times a day (QID) | INTRAMUSCULAR | Status: DC | PRN
  Administered 2020-03-05: 4 mg via INTRAVENOUS
  Filled 2020-03-05: qty 2

## 2020-03-05 MED ORDER — OXYCODONE HCL 5 MG PO TABS: 5.0000 mg | ORAL_TABLET | ORAL | Status: DC | PRN

## 2020-03-05 MED ORDER — SUCCINYLCHOLINE 20MG/ML (10ML) SYRINGE FOR MEDFUSION PUMP - OPTIME
INTRAMUSCULAR | Status: DC | PRN
  Administered 2020-03-05: 180 mg via INTRAVENOUS

## 2020-03-05 MED ORDER — SODIUM CHLORIDE 0.9 % IV SOLN
2.0000 g | Freq: Once | INTRAVENOUS | Status: AC
  Administered 2020-03-05: 2 g via INTRAVENOUS
  Filled 2020-03-05: qty 20

## 2020-03-05 MED ORDER — BUPIVACAINE-EPINEPHRINE 0.25% -1:200000 IJ SOLN
INTRAMUSCULAR | Status: DC | PRN
  Administered 2020-03-05: 30 mL

## 2020-03-05 MED ORDER — PROPOFOL 10 MG/ML IV BOLUS
INTRAVENOUS | Status: AC
  Filled 2020-03-05: qty 40

## 2020-03-05 MED ORDER — SODIUM CHLORIDE 0.9 % IV SOLN: INTRAVENOUS | Status: DC

## 2020-03-05 MED ORDER — PHENYLEPHRINE 40 MCG/ML (10ML) SYRINGE FOR IV PUSH (FOR BLOOD PRESSURE SUPPORT)
PREFILLED_SYRINGE | INTRAVENOUS | Status: DC | PRN
  Administered 2020-03-05: 160 ug via INTRAVENOUS

## 2020-03-05 MED ORDER — OXYCODONE HCL 5 MG/5ML PO SOLN: 5.0000 mg | Freq: Once | ORAL | Status: AC | PRN

## 2020-03-05 MED ORDER — ROCURONIUM BROMIDE 10 MG/ML (PF) SYRINGE
PREFILLED_SYRINGE | INTRAVENOUS | Status: AC
  Filled 2020-03-05: qty 10

## 2020-03-05 MED ORDER — MIDAZOLAM HCL 2 MG/2ML IJ SOLN
INTRAMUSCULAR | Status: DC | PRN
  Administered 2020-03-05: 2 mg via INTRAVENOUS

## 2020-03-05 MED ORDER — LIDOCAINE HCL (CARDIAC) PF 100 MG/5ML IV SOSY
PREFILLED_SYRINGE | INTRAVENOUS | Status: DC | PRN
  Administered 2020-03-05: 60 mg via INTRAVENOUS

## 2020-03-05 MED ORDER — DEXAMETHASONE SODIUM PHOSPHATE 10 MG/ML IJ SOLN
INTRAMUSCULAR | Status: AC
  Filled 2020-03-05: qty 1

## 2020-03-05 MED ORDER — METRONIDAZOLE IN NACL 5-0.79 MG/ML-% IV SOLN
500.0000 mg | Freq: Once | INTRAVENOUS | Status: AC
  Administered 2020-03-05: 500 mg via INTRAVENOUS
  Filled 2020-03-05: qty 100

## 2020-03-05 MED ORDER — POLYETHYLENE GLYCOL 3350 17 G PO PACK: 17.0000 g | PACK | Freq: Every day | ORAL | Status: DC | PRN

## 2020-03-05 MED ORDER — PROMETHAZINE HCL 25 MG/ML IJ SOLN: 6.2500 mg | INTRAMUSCULAR | Status: DC | PRN

## 2020-03-05 MED ORDER — KETOROLAC TROMETHAMINE 15 MG/ML IJ SOLN
15.0000 mg | Freq: Three times a day (TID) | INTRAMUSCULAR | Status: DC
  Administered 2020-03-05 – 2020-03-06 (×2): 15 mg via INTRAVENOUS
  Filled 2020-03-05 (×2): qty 1

## 2020-03-05 MED ORDER — SCOPOLAMINE 1 MG/3DAYS TD PT72
1.0000 | MEDICATED_PATCH | TRANSDERMAL | Status: DC
  Administered 2020-03-05: 1.5 mg via TRANSDERMAL
  Filled 2020-03-05: qty 1

## 2020-03-05 MED ORDER — LIDOCAINE HCL (PF) 2 % IJ SOLN
INTRAMUSCULAR | Status: AC
  Filled 2020-03-05: qty 5

## 2020-03-05 MED ORDER — RINGERS IRRIGATION IR SOLN
Status: DC | PRN
  Administered 2020-03-05: 1000 mL

## 2020-03-05 MED ORDER — IOHEXOL 300 MG/ML  SOLN
100.0000 mL | Freq: Once | INTRAMUSCULAR | Status: AC | PRN
  Administered 2020-03-05: 100 mL via INTRAVENOUS

## 2020-03-05 MED ORDER — DEXAMETHASONE SODIUM PHOSPHATE 10 MG/ML IJ SOLN
INTRAMUSCULAR | Status: DC | PRN
  Administered 2020-03-05: 10 mg via INTRAVENOUS

## 2020-03-05 MED ORDER — ONDANSETRON HCL 4 MG/2ML IJ SOLN
4.0000 mg | Freq: Once | INTRAMUSCULAR | Status: AC
  Administered 2020-03-05: 4 mg via INTRAVENOUS
  Filled 2020-03-05: qty 2

## 2020-03-05 MED ORDER — PROPOFOL 10 MG/ML IV BOLUS
INTRAVENOUS | Status: DC | PRN
  Administered 2020-03-05: 200 mg via INTRAVENOUS

## 2020-03-05 MED ORDER — AMISULPRIDE (ANTIEMETIC) 5 MG/2ML IV SOLN: 10.0000 mg | Freq: Once | INTRAVENOUS | Status: DC | PRN

## 2020-03-05 MED ORDER — GABAPENTIN 300 MG PO CAPS
300.0000 mg | ORAL_CAPSULE | ORAL | Status: AC
  Administered 2020-03-05: 300 mg via ORAL
  Filled 2020-03-05: qty 1

## 2020-03-05 MED ORDER — PANTOPRAZOLE SODIUM 40 MG PO TBEC
40.0000 mg | DELAYED_RELEASE_TABLET | Freq: Every morning | ORAL | Status: DC
  Administered 2020-03-06: 40 mg via ORAL
  Filled 2020-03-05: qty 1

## 2020-03-05 MED ORDER — LACTATED RINGERS IV SOLN: INTRAVENOUS | Status: DC | PRN

## 2020-03-05 MED ORDER — FENTANYL CITRATE (PF) 100 MCG/2ML IJ SOLN
INTRAMUSCULAR | Status: AC
  Administered 2020-03-05: 50 ug
  Filled 2020-03-05: qty 2

## 2020-03-05 MED ORDER — MORPHINE SULFATE (PF) 2 MG/ML IV SOLN
2.0000 mg | INTRAVENOUS | Status: DC | PRN
  Administered 2020-03-05: 2 mg via INTRAVENOUS
  Filled 2020-03-05: qty 1

## 2020-03-05 MED ORDER — ONDANSETRON HCL 4 MG/2ML IJ SOLN
INTRAMUSCULAR | Status: AC
  Filled 2020-03-05: qty 2

## 2020-03-05 SURGICAL SUPPLY — 53 items
ADH SKN CLS APL DERMABOND .7 (GAUZE/BANDAGES/DRESSINGS) ×1
APL SKNCLS STERI-STRIP NONHPOA (GAUZE/BANDAGES/DRESSINGS) ×2
APPLIER CLIP 5 13 M/L LIGAMAX5 (MISCELLANEOUS)
APPLIER CLIP ROT 10 11.4 M/L (STAPLE) ×2
APR CLP MED LRG 11.4X10 (STAPLE) ×1
APR CLP MED LRG 5 ANG JAW (MISCELLANEOUS)
BAG SPEC RTRVL 10 TROC 200 (ENDOMECHANICALS) ×1
BENZOIN TINCTURE PRP APPL 2/3 (GAUZE/BANDAGES/DRESSINGS) ×4 IMPLANT
BNDG ADH 1X3 SHEER STRL LF (GAUZE/BANDAGES/DRESSINGS) ×10 IMPLANT
BNDG ADH THN 3X1 STRL LF (GAUZE/BANDAGES/DRESSINGS) ×5
CABLE HIGH FREQUENCY MONO STRZ (ELECTRODE) ×2 IMPLANT
CLIP APPLIE 5 13 M/L LIGAMAX5 (MISCELLANEOUS) IMPLANT
CLIP APPLIE ROT 10 11.4 M/L (STAPLE) IMPLANT
CLIP VESOLOCK XL 6/CT (CLIP) ×2 IMPLANT
COVER SURGICAL LIGHT HANDLE (MISCELLANEOUS) ×2 IMPLANT
COVER WAND RF STERILE (DRAPES) IMPLANT
DECANTER SPIKE VIAL GLASS SM (MISCELLANEOUS) ×1 IMPLANT
DERMABOND ADVANCED (GAUZE/BANDAGES/DRESSINGS) ×1
DERMABOND ADVANCED .7 DNX12 (GAUZE/BANDAGES/DRESSINGS) IMPLANT
DRAIN CHANNEL 19F RND (DRAIN) IMPLANT
DRAPE LAPAROSCOPIC ABDOMINAL (DRAPES) ×2 IMPLANT
ELECT REM PT RETURN 15FT ADLT (MISCELLANEOUS) ×2 IMPLANT
ENDOLOOP SUT PDS II  0 18 (SUTURE) ×2
ENDOLOOP SUT PDS II 0 18 (SUTURE) IMPLANT
EVACUATOR SILICONE 100CC (DRAIN) IMPLANT
GLOVE SURG POLYISO LF SZ7 (GLOVE) ×2 IMPLANT
GLOVE SURG UNDER POLY LF SZ7 (GLOVE) ×2 IMPLANT
GOWN STRL REUS W/TWL LRG LVL3 (GOWN DISPOSABLE) ×2 IMPLANT
GOWN STRL REUS W/TWL XL LVL3 (GOWN DISPOSABLE) IMPLANT
GRASPER SUT TROCAR 14GX15 (MISCELLANEOUS) IMPLANT
KIT BASIN OR (CUSTOM PROCEDURE TRAY) ×2 IMPLANT
KIT TURNOVER KIT A (KITS) ×2 IMPLANT
NDL INSUFFLATION 14GA 150MM (NEEDLE) IMPLANT
NEEDLE INSUFFLATION 14GA 150MM (NEEDLE) ×2 IMPLANT
PENCIL SMOKE EVACUATOR (MISCELLANEOUS) IMPLANT
POUCH RETRIEVAL ECOSAC 10 (ENDOMECHANICALS) ×1 IMPLANT
POUCH RETRIEVAL ECOSAC 10MM (ENDOMECHANICALS) ×2
RELOAD 45 VASCULAR/THIN (ENDOMECHANICALS) ×2 IMPLANT
RELOAD STAPLE 45 3.5 BLU ETS (ENDOMECHANICALS) IMPLANT
RELOAD STAPLE TA45 3.5 REG BLU (ENDOMECHANICALS) ×2 IMPLANT
SCISSORS LAP 5X35 DISP (ENDOMECHANICALS) ×2 IMPLANT
SET IRRIG TUBING LAPAROSCOPIC (IRRIGATION / IRRIGATOR) ×2 IMPLANT
SET TUBE SMOKE EVAC HIGH FLOW (TUBING) ×2 IMPLANT
SLEEVE XCEL OPT CAN 5 100 (ENDOMECHANICALS) ×2 IMPLANT
STRIP CLOSURE SKIN 1/2X4 (GAUZE/BANDAGES/DRESSINGS) ×2 IMPLANT
STRIP CLOSURE SKIN 1/4X4 (GAUZE/BANDAGES/DRESSINGS) ×2 IMPLANT
SUT ETHILON 2 0 PS N (SUTURE) IMPLANT
SUT MNCRL AB 4-0 PS2 18 (SUTURE) ×2 IMPLANT
TOWEL OR 17X26 10 PK STRL BLUE (TOWEL DISPOSABLE) ×2 IMPLANT
TOWEL OR NON WOVEN STRL DISP B (DISPOSABLE) ×2 IMPLANT
TRAY LAPAROSCOPIC (CUSTOM PROCEDURE TRAY) ×2 IMPLANT
TROCAR BLADELESS OPT 5 100 (ENDOMECHANICALS) ×2 IMPLANT
TROCAR XCEL 12X100 BLDLESS (ENDOMECHANICALS) ×2 IMPLANT

## 2020-03-05 NOTE — Op Note (Signed)
Patient: Stephanie Davies ([birth year], [id number])  Date of Surgery: 03/05/2020   Preoperative Diagnosis: appendicitis   Postoperative Diagnosis: appendicitis   Surgical Procedure: APPENDECTOMY LAPAROSCOPIC:    Operative Team Members:  Surgeon(s) and Role:    * Marshayla Mitschke, Hyman Hopes, MD - Primary   Anesthesiologist: [name], MD CRNA: Minerva Ends, CRNA   Anesthesia: General   Fluids:  Total I/O In: 900 [I.V.:900] Out: 10 [Blood:10]  Complications: None  Drains:  none   Specimen:  ID Type Source Tests Collected by Time Destination  1 : appendix GI Appendix SURGICAL PATHOLOGY Takiera Mayo, Hyman Hopes, MD 03/05/2020 Paulo Fruit      Disposition:  PACU - hemodynamically stable.  Plan of Care: Admit for overnight observation    Indications for Procedure: Thera ISABELLE MATT is a 45 y.o. female who presented with abdominal pain.  History, physical and imaging was concerning for appendicitis, so laparoscopic appendectomy was recommended for the patient.  The procedure itself, as well as the risks, benefits and alternatives were discussed with the patient.  Risks discussed included but were not limited to the risk of bleeding, infection, damage to nearby structures, need to convert to open procedure, incisional hernia, and the need for additional procedures or surgeries.  With this discussion complete and all questions answered the patient granted consent to proceed.  Findings: Inflamed appendix  Description of Procedure:   On the date stated above, the patient was taken to the operating room suite and placed in supine positioning with the left arm tucked.  Sequential compression devices were placed on the lower extremities to prevent blood clots.  General endotracheal anesthesia was induced.  The patient urinated just prior to surgery so a foley catheter was not placed.  Preoperative antibiotics (cefazolin) were given within 30 minutes of incision.  The patient's abdomen was prepped  and draped in the usual sterile fashion.  A time-out was completed verifying the correct patient, procedure, positioning and equipment needed for the case.  We began by anesthetizing the skin with local anesthetic and then making a 5 mm incision just below the umbilicus.  We dissected through the subcutaneous tissues to the fascia.  The fascia was grasped and elevated using a Kocher clamp.  A Veress needle was inserted into the abdomen and the abdomen was insufflated to 15 mmHg.  A 5 mm trocar was inserted in this position under optical guidance and then the abdomen was inspected.  There was no trauma to the underlying viscera with initial trocar placement.  Any abnormal findings, other than inflammation in the right lower quadrant, are listed above in the findings section.  Two additional trocars were placed, one 5 mm trocar suprapubically and one 12 mm trocar in the left lower quadrant.  These were placed under direct vision without any trauma to the underlying viscera.    The patient was then placed in head down, left side down positioning.  The appendix was identified and dissected free from its attachments to the abdominal wall, small intestine and cecum.  A window was created in the mesoappendix using blunt dissection.  We used one 45 mm blueload of the endoscopic linear stapler to divide the base of the appendix from the cecum.  Then one 45 mm white load of the endoscopic linear stapler was then used to divide the mesoappendix.  The appendix was placed in an endocatch bag and removed through the 12 mm port site in the left lower quadrant.  The operative field was dry  on final inspection. The staple line was well formed.  There was some bleeding from the staple line so a 10 mm clip applier was used to obtain hemostasis.  There was good hemostasis at the end of the case.  At this point we directed our attention to closure.  The patient was moved back to a level position.  The 12 mm trocar site was closed at  the fascial level using an 0-vicryl on a fascial suture passer.  The abdomen was desufflated.  The skin was closed using 4-0 Monocryl and dermabond.  All sponge and needle counts were correct at the end of the case.    Ivar Drape, MD General, Bariatric, & Minimally Invasive Surgery [facility], [place]

## 2020-03-05 NOTE — ED Triage Notes (Signed)
Pt BIB EMS  Per EMS pt has abdominal pain that radiates down the right side. Complaints of nausea, vomiting, diarrhea. States that she has had appendicitis twice and these are the same symptoms. Pain 10/10 Sharp and feels like her insides are on fire. Pain eases some with vomiting but comes right back. Taken pain medication at home earlier today (unsure of name)  Vitals 110/60 88 100 Room air  97.7 Tympanic

## 2020-03-05 NOTE — Anesthesia Postprocedure Evaluation (Signed)
Anesthesia Post Note  Patient: Stephanie Davies  Procedure(s) Performed: APPENDECTOMY LAPAROSCOPIC (N/A Abdomen)     Patient location during evaluation: PACU Anesthesia Type: General Level of consciousness: awake Pain management: pain level controlled Vital Signs Assessment: post-procedure vital signs reviewed and stable Respiratory status: spontaneous breathing and respiratory function stable Cardiovascular status: stable Postop Assessment: no apparent nausea or vomiting Anesthetic complications: no   No complications documented.  Last Vitals:  Vitals:   03/05/20 2030 03/05/20 2051  BP: 128/73 139/74  Pulse: 86 86  Resp: 19 16  Temp: 36.6 C 36.7 C  SpO2: 97% 99%    Last Pain:  Vitals:   03/05/20 2051  TempSrc: Oral  PainSc:                  [name]

## 2020-03-05 NOTE — Discharge Instructions (Signed)
[facility] ° °Please arrive at least 30 min before your appointment to complete your check in paperwork.  If you are unable to arrive 30 min prior to your appointment time we may have to cancel or reschedule you. °LAPAROSCOPIC SURGERY: POST OP INSTRUCTIONS °Always review your discharge instruction sheet given to you by the facility where your surgery was performed. °IF YOU HAVE DISABILITY OR FAMILY LEAVE FORMS, YOU MUST BRING THEM TO THE OFFICE FOR PROCESSING.   °DO NOT GIVE THEM TO YOUR DOCTOR. ° °PAIN CONTROL ° °1. First take acetaminophen (Tylenol) AND/or ibuprofen (Advil) to control your pain after surgery.  Follow directions on package.  Taking acetaminophen (Tylenol) and/or ibuprofen (Advil) regularly after surgery will help to control your pain and lower the amount of prescription pain medication you may need.  You should not take more than 4,000 mg (4 grams) of acetaminophen (Tylenol) in 24 hours.  You should not take ibuprofen (Advil), aleve, motrin, naprosyn or other NSAIDS if you have a history of stomach ulcers or chronic kidney disease.  °2. A prescription for pain medication may be given to you upon discharge.  Take your pain medication as prescribed, if you still have uncontrolled pain after taking acetaminophen (Tylenol) or ibuprofen (Advil). °3. Use ice packs to help control pain. °4. If you need a refill on your pain medication, please contact your pharmacy.  They will contact our office to request authorization. Prescriptions will not be filled after 5pm or on week-ends. ° °HOME MEDICATIONS °5. Take your usually prescribed medications unless otherwise directed. ° °DIET °6. You should follow a light diet the first few days after arrival home.  Be sure to include lots of fluids daily. Avoid fatty, fried foods.  ° °CONSTIPATION °7. It is common to experience some constipation after surgery and if you are taking pain medication.  Increasing fluid intake and taking a stool  softener (such as Colace) will usually help or prevent this problem from occurring.  A mild laxative (Milk of Magnesia or Miralax) should be taken according to package instructions if there are no bowel movements after 48 hours. ° °WOUND/INCISION CARE °8. Most patients will experience some swelling and bruising in the area of the incisions.  Ice packs will help.  Swelling and bruising can take several days to resolve.  °9. Unless discharge instructions indicate otherwise, follow guidelines below  °a. STERI-STRIPS - you may remove your outer bandages 48 hours after surgery, and you may shower at that time.  You have steri-strips (small skin tapes) in place directly over the incision.  These strips should be left on the skin for 7-10 days.   °b. DERMABOND/SKIN GLUE - you may shower in 24 hours.  The glue will flake off over the next 2-3 weeks. °10. Any sutures or staples will be removed at the office during your follow-up visit. ° °ACTIVITIES °11. You may resume regular (light) daily activities beginning the next day--such as daily self-care, walking, climbing stairs--gradually increasing activities as tolerated.  You may have sexual intercourse when it is comfortable.  Refrain from any heavy lifting or straining until approved by your doctor. °a. You may drive when you are no longer taking prescription pain medication, you can comfortably wear a seatbelt, and you can safely maneuver your car and apply brakes. ° °FOLLOW-UP °12. You should see your doctor in the office for a follow-up appointment approximately 2-3 weeks after your surgery.  You should have been given your post-op/follow-up appointment when   your surgery was scheduled.  If you did not receive a post-op/follow-up appointment, make sure that you call for this appointment within a day or two after you arrive home to insure a convenient appointment time. ° °OTHER INSTRUCTIONS ° °WHEN TO CALL YOUR DOCTOR: °1. Fever over 101.0 °2. Inability to  urinate °3. Continued bleeding from incision. °4. Increased pain, redness, or drainage from the incision. °5. Increasing abdominal pain ° °The clinic staff is available to answer your questions during regular business hours.  Please don’t hesitate to call and ask to speak to one of the nurses for clinical concerns.  If you have a medical emergency, go to the nearest emergency room or call 911.  A surgeon from [facility] is always on call at the hospital. °[address], [address], [place], [place]  [address] ? [address] [address], [place], [place]   [address] °[phone] ? [phone] ? FAX [phone] ° ° ° °

## 2020-03-05 NOTE — H&P (Signed)
Admitting Physician: Hyman Hopes Kayshawn Ozburn  Service: General Surgery  CC: Abdominal pain  Subjective   HPI: Kaytlyn MIKYLA SCHACHTER is an 45 y.o. female who is here for recurrent abdominal pain.  She was diagnosed with appendicitis about a month ago.  Unfortunately she was diagnosed with Covid at the same time.  We elected to treat her with antibiotics as it appeared she was a good candidate.  Unfortunately she is return to the emergency room multiple times with recurrent abdominal pain.  Her abdominal pain is diffuse throughout the abdomen.  She is fed up with antibiotics and like her appendix removed.  Past Medical History:  Diagnosis Date  . Alopecia   . Anal fistula   . Anxiety and depression 03/07/2014  . GERD (gastroesophageal reflux disease) 02/14/2014  . Goiter 03/07/2014  . Headache(784.0)   . Hemorrhoids   . Kidney stone   . Pseudotumor cerebri     Past Surgical History:  Procedure Laterality Date  . CHOLECYSTECTOMY  2004  . kidney stent: 12/2018 at [facility] History  Problem Relation Age of Onset  . Diabetes Mother   . Hypertension Mother   . Esophageal cancer Maternal Grandmother   . Esophageal cancer Maternal Grandfather   . Diabetes Maternal Grandfather   . Colon cancer Neg Hx   . Colon polyps Neg Hx   . Gallbladder disease Neg Hx   . Kidney disease Neg Hx     Social:  reports that she has never smoked. She has never used smokeless tobacco. She reports that she does not drink alcohol and does not use drugs.  Allergies: No Known Allergies  Medications: Current Outpatient Medications  Medication Instructions  . aspirin-acetaminophen-caffeine (EXCEDRIN MIGRAINE) 250-250-65 MG tablet 2 tablets, Oral, As needed  . pantoprazole (PROTONIX) 40 mg, Oral, Every morning  . sertraline (ZOLOFT) 100 MG tablet 1 tablet daily for 2 weeks, then 1.5 tablets daily    ROS - all of the below systems have been reviewed with the patient and positives are indicated with  bold text General: chills, fever or night sweats Eyes: blurry vision or double vision ENT: epistaxis or sore throat Allergy/Immunology: itchy/watery eyes or nasal congestion Hematologic/Lymphatic: bleeding problems, blood clots or swollen lymph nodes Endocrine: temperature intolerance or unexpected weight changes Breast: new or changing breast lumps or nipple discharge Resp: cough, shortness of breath, or wheezing CV: chest pain or dyspnea on exertion GI: as per HPI GU: dysuria, trouble voiding, or hematuria MSK: joint pain or joint stiffness Neuro: TIA or stroke symptoms Derm: pruritus and skin lesion changes Psych: anxiety and depression  Objective   PE Blood pressure 134/70, pulse 81, temperature 99.8 F (37.7 C), temperature source Oral, resp. rate 20, height 5\' 6"  (1.676 m), weight 127 kg, SpO2 94 %. Constitutional: NAD; conversant; no deformities Eyes: Moist conjunctiva; no lid lag; anicteric; PERRL Neck: Trachea midline; no thyromegaly Lungs: Normal respiratory effort; no tactile fremitus CV: RRR; no palpable thrills; no pitting edema GI: Abd soft, tender to palpation over the right lower quadrant, mild tenderness elsewhere, no peritoneal signs; no palpable hepatosplenomegaly MSK: Normal range of motion of extremities; no clubbing/cyanosis Psychiatric: Appropriate affect; alert and oriented x3 Lymphatic: No palpable cervical or axillary lymphadenopathy  Results for orders placed or performed during the hospital encounter of 03/05/20 (from the past 24 hour(s))  Comprehensive metabolic panel     Status: Abnormal   Collection Time: 03/05/20 12:58 AM  Result Value Ref Range  Sodium 138 135 - 145 mmol/L   Potassium 4.0 3.5 - 5.1 mmol/L   Chloride 107 98 - 111 mmol/L   CO2 19 (L) 22 - 32 mmol/L   Glucose, Bld 112 (H) 70 - 99 mg/dL   BUN 8 6 - 20 mg/dL   Creatinine, Ser 4.85 0.44 - 1.00 mg/dL   Calcium 9.3 8.9 - 46.2 mg/dL   Total Protein 7.4 6.5 - 8.1 g/dL   Albumin 3.7  3.5 - 5.0 g/dL   AST 21 15 - 41 U/L   ALT 21 0 - 44 U/L   Alkaline Phosphatase 101 38 - 126 U/L   Total Bilirubin 0.8 0.3 - 1.2 mg/dL   GFR, Estimated >70 >35 mL/min   Anion gap 12 5 - 15  CBC with Differential     Status: Abnormal   Collection Time: 03/05/20 12:58 AM  Result Value Ref Range   WBC 18.8 (H) 4.0 - 10.5 K/uL   RBC 5.17 (H) 3.87 - 5.11 MIL/uL   Hemoglobin 13.2 12.0 - 15.0 g/dL   HCT 00.9 38.1 - 82.9 %   MCV 79.5 (L) 80.0 - 100.0 fL   MCH 25.5 (L) 26.0 - 34.0 pg   MCHC 32.1 30.0 - 36.0 g/dL   RDW 93.7 (H) 16.9 - 67.8 %   Platelets 432 (H) 150 - 400 K/uL   nRBC 0.0 0.0 - 0.2 %   Neutrophils Relative % 91 %   Neutro Abs 16.9 (H) 1.7 - 7.7 K/uL   Lymphocytes Relative 6 %   Lymphs Abs 1.2 0.7 - 4.0 K/uL   Monocytes Relative 3 %   Monocytes Absolute 0.6 0.1 - 1.0 K/uL   Eosinophils Relative 0 %   Eosinophils Absolute 0.0 0.0 - 0.5 K/uL   Basophils Relative 0 %   Basophils Absolute 0.1 0.0 - 0.1 K/uL   Immature Granulocytes 0 %   Abs Immature Granulocytes 0.07 0.00 - 0.07 K/uL  Lipase, blood     Status: None   Collection Time: 03/05/20 12:58 AM  Result Value Ref Range   Lipase 22 11 - 51 U/L  Urinalysis, Routine w reflex microscopic Urine, Clean Catch     Status: Abnormal   Collection Time: 03/05/20 12:59 AM  Result Value Ref Range   Color, Urine YELLOW YELLOW   APPearance CLEAR CLEAR   Specific Gravity, Urine 1.008 1.005 - 1.030   pH 8.0 5.0 - 8.0   Glucose, UA NEGATIVE NEGATIVE mg/dL   Hgb urine dipstick SMALL (A) NEGATIVE   Bilirubin Urine NEGATIVE NEGATIVE   Ketones, ur NEGATIVE NEGATIVE mg/dL   Protein, ur NEGATIVE NEGATIVE mg/dL   Nitrite NEGATIVE NEGATIVE   Leukocytes,Ua NEGATIVE NEGATIVE   RBC / HPF 0-5 0 - 5 RBC/hpf   WBC, UA 0-5 0 - 5 WBC/hpf   Bacteria, UA RARE (A) NONE SEEN   Squamous Epithelial / LPF 0-5 0 - 5   Mucus PRESENT   I-Stat beta hCG blood, ED     Status: None   Collection Time: 03/05/20  2:27 AM  Result Value Ref Range   I-stat  hCG, quantitative <5.0 <5 mIU/mL   Comment 3          Resp Panel by RT-PCR (Flu A&B, Covid) Nasopharyngeal Swab     Status: Abnormal   Collection Time: 03/05/20  4:52 AM   Specimen: Nasopharyngeal Swab; Nasopharyngeal(NP) swabs in vial transport medium  Result Value Ref Range   SARS Coronavirus 2 by RT PCR POSITIVE (A)  NEGATIVE   Influenza A by PCR NEGATIVE NEGATIVE   Influenza B by PCR NEGATIVE NEGATIVE     Imaging Orders     CT ABDOMEN PELVIS W CONTRAST   Assessment and Plan   Venissa FERRELL FLAM is an 45 y.o. female with abdominal pain, found to have recurrent appendicitis despite antibiotic treatment.  Due to her failure of antibiotic treatment for appendicitis I recommended laparoscopic, possibly open appendectomy.  The risk, benefits, and alternatives of surgery were discussed.  The risk discussed included but not limited to the risk of infection, bleeding, damage nearby structures, postoperative abscess, incisional hernia, and continued abdominal pain despite appendectomy.  After full discussion all questions answered the patient granted consent to proceed.  We will proceed to the operating room as soon as time is available.    ICD-10-CM   1. Acute appendicitis, unspecified acute appendicitis type  K35.80      Quentin Ore, MD  [facility] Use [email or web address] to contact on call provider

## 2020-03-05 NOTE — Anesthesia Procedure Notes (Signed)
Date/Time: 03/05/2020 7:52 PM Performed by: Minerva Ends, CRNA Oxygen Delivery Method: Simple face mask Placement Confirmation: positive ETCO2 and breath sounds checked- equal and bilateral Dental Injury: Teeth and Oropharynx as per pre-operative assessment

## 2020-03-05 NOTE — Anesthesia Preprocedure Evaluation (Addendum)
Anesthesia Evaluation  Patient identified by MRN, date of birth, ID band Patient awake    Reviewed: Allergy & Precautions, NPO status , Patient's Chart, lab work & pertinent test results  Airway Mallampati: I  TM Distance: >3 FB Neck ROM: Full    Dental no notable dental hx.    Pulmonary sleep apnea ,  H/o COVID in Dec 2021   Pulmonary exam normal breath sounds clear to auscultation       Cardiovascular Exercise Tolerance: Good negative cardio ROS Normal cardiovascular exam Rhythm:Regular Rate:Normal     Neuro/Psych  Headaches,    GI/Hepatic GERD  ,appendicitis   Endo/Other  Morbid obesity  Renal/GU Renal disease  negative genitourinary   Musculoskeletal negative musculoskeletal ROS (+)   Abdominal (+) + obese,   Peds negative pediatric ROS (+)  Hematology negative hematology ROS (+)   Anesthesia Other Findings   Reproductive/Obstetrics negative OB ROS                            Anesthesia Physical Anesthesia Plan  ASA: III  Anesthesia Plan: General   Post-op Pain Management:    Induction: Intravenous and Rapid sequence  PONV Risk Score and Plan: 3 and Ondansetron, Dexamethasone, Scopolamine patch - Pre-op, Midazolam and Treatment may vary due to age or medical condition  Airway Management Planned: Oral ETT  Additional Equipment:   Intra-op Plan:   Post-operative Plan: Extubation in OR  Informed Consent: I have reviewed the patients History and Physical, chart, labs and discussed the procedure including the risks, benefits and alternatives for the proposed anesthesia with the patient or authorized representative who has indicated his/her understanding and acceptance.       Plan Discussed with: CRNA, Anesthesiologist and Surgeon  Anesthesia Plan Comments:         Anesthesia Quick Evaluation

## 2020-03-05 NOTE — Transfer of Care (Signed)
Immediate Anesthesia Transfer of Care Note  Patient: Stephanie Davies  Procedure(s) Performed: APPENDECTOMY LAPAROSCOPIC (N/A Abdomen)  Patient Location: PACU  Anesthesia Type:General  Level of Consciousness: awake and alert   Airway & Oxygen Therapy: Patient Spontanous Breathing and Patient connected to face mask oxygen  Post-op Assessment: Report given to RN and Post -op Vital signs reviewed and stable  Post vital signs: Reviewed and stable  Last Vitals:  Vitals Value Taken Time  BP    Temp    Pulse 79 03/05/20 1958  Resp 24 03/05/20 1958  SpO2 100 % 03/05/20 1958  Vitals shown include unvalidated device data.  Last Pain:  Vitals:   03/05/20 1700  TempSrc:   PainSc: 4       Patients Stated Pain Goal: 2 (03/05/20 1700)  Complications: No complications documented.

## 2020-03-05 NOTE — ED Notes (Signed)
Report to floor

## 2020-03-05 NOTE — ED Provider Notes (Signed)
General surgery will admit and take to the OR.   Pricilla Loveless, MD 03/05/20 [phone]

## 2020-03-05 NOTE — ED Provider Notes (Signed)
[facility] DEPT Provider Note   CSN: [id number] Arrival date & time: 03/04/20  2357   History Chief Complaint  Patient presents with  . Abdominal Pain    Stephanie Davies is a 45 y.o. female.  The history is provided by the patient.  Abdominal Pain She has history of kidney stones, pseudotumor cerebri, appendicitis treated with antibiotics and comes in with abdominal pain which started about 10 AM.  Pain started in the left upper abdomen and moved to the right upper abdomen and then to the right lower abdomen.  Pain is sharp and severe and she rates it at 10/10.  It is worse with movement, slightly better if she holds still.  There has been associated nausea, vomiting, diarrhea.  There is brief improvement in pain following vomiting.  She denies fever, chills, sweats.  Past Medical History:  Diagnosis Date  . Alopecia   . Anal fistula   . Anxiety and depression 03/07/2014  . GERD (gastroesophageal reflux disease) 02/14/2014  . Goiter 03/07/2014  . Headache(784.0)   . Hemorrhoids   . Kidney stone   . Pseudotumor cerebri     Patient Active Problem List   Diagnosis Date Noted  . Appendicitis, acute 01/11/2020  . OSA (obstructive sleep apnea) 12/01/2019  . Obesity, Class III, BMI 40-49.9 (morbid obesity) ([place]) 11/29/2019  . Annual physical exam 11/29/2019  . Abdominal pain 11/03/2019  . IBS (irritable bowel syndrome) 07/23/2019  . History of kidney stones 07/23/2019  . Pseudotumor cerebri   . Anxiety 05/13/2016  . Cough 04/11/2015  . PCP NOTES >>>>>>>>>>>>>>>>>>>>>>>>>>>>>>>>>> 09/20/2014  . Anxiety and depression 03/07/2014  . Goiter 03/07/2014  . Hives 02/20/2014  . GERD (gastroesophageal reflux disease) 02/14/2014  . Upper airway cough syndrome 01/22/2014  . Eczema, allergic 01/09/2014  . Dyspnea 01/09/2014  . Hip pain 10/15/2013  . Pain in joint, pelvic region and thigh 10/08/2013  . External bleeding hemorrhoids 10/08/2013  . Rh  negative status during pregnancy 06/06/2013    Past Surgical History:  Procedure Laterality Date  . CHOLECYSTECTOMY  2004  . kidney stent: 12/2018 at [facility]       OB History    Gravida  1   Para  1   Term  1   Preterm      AB      Living  1     SAB      IAB      Ectopic      Multiple      Live Births  1           Family History  Problem Relation Age of Onset  . Diabetes Mother   . Hypertension Mother   . Esophageal cancer Maternal Grandmother   . Esophageal cancer Maternal Grandfather   . Diabetes Maternal Grandfather   . Colon cancer Neg Hx   . Colon polyps Neg Hx   . Gallbladder disease Neg Hx   . Kidney disease Neg Hx     Social History   Tobacco Use  . Smoking status: Never Smoker  . Smokeless tobacco: Never Used  Vaping Use  . Vaping Use: Never used  Substance Use Topics  . Alcohol use: No  . Drug use: No    Home Medications Prior to Admission medications   Medication Sig Start Date End Date Taking? Authorizing Provider  AMBULATORY NON FORMULARY MEDICATION Medication Name: Carafate Slurry. Dissolve 1 tablet into a small amount of water and take by mouth qid  12/11/19   Thornton Park, MD  aspirin-acetaminophen-caffeine (EXCEDRIN MIGRAINE) [phone] MG tablet Take 2 tablets by mouth as needed for migraine.    [provider]  hydrOXYzine (ATARAX/VISTARIL) 10 MG tablet Take 1-2 tablets (10-20 mg total) by mouth 2 (two) times daily as needed (anxiety). Patient taking differently: Take 10-20 mg by mouth 2 (two) times daily as needed for anxiety. 10/25/19   Colon Branch, MD  hyoscyamine (LEVSIN SL) 0.125 MG SL tablet Place 1 tablet (0.125 mg total) under the tongue every 4 (four) hours as needed for cramping. 12/11/19   Thornton Park, MD  ketoconazole (NIZORAL) 2 % cream Apply 1 application topically daily. Patient not taking: Reported on 01/11/2020 11/30/19   Colon Branch, MD  ketoconazole (NIZORAL) 2 % shampoo Apply 1 application  topically 2 (two) times a week. 08/13/19   Colon Branch, MD  pantoprazole (PROTONIX) 20 MG tablet Take 20 mg by mouth daily.  11/03/19   [provider]  PROAIR HFA 108 (90 Base) MCG/ACT inhaler Inhale 2 puffs into the lungs every 6 (six) hours as needed for wheezing or shortness of breath. Patient not taking: No sig reported 10/12/19   Colon Branch, MD  sertraline (ZOLOFT) 100 MG tablet 1 tablet daily for 2 weeks, then 1.5 tablets daily Patient taking differently: Take 150 mg by mouth daily. 10/25/19   Colon Branch, MD  SUPREP BOWEL PREP KIT 17.5-3.13-1.6 GM/177ML SOLN Take 1 kit by mouth as directed. For colonoscopy prep BIN: [id number] PCN: [id number] GROUP: [id number] ID: [id number] Patient not taking: Reported on 01/11/2020 12/11/19   Thornton Park, MD    Allergies    Patient has no known allergies.  Review of Systems   Review of Systems  Gastrointestinal: Positive for abdominal pain.  All other systems reviewed and are negative.   Physical Exam Updated Vital Signs BP 130/85 (BP Location: Left Arm)   Pulse 94   Temp 99.8 F (37.7 C) (Oral)   Resp (!) 35   Ht $R'5\' 6"'qh$  (1.676 m)   Wt 127 kg   SpO2 99%   BMI 45.19 kg/m   Physical Exam Vitals and nursing note reviewed.   45 year old female, appears uncomfortable, but is in no acute distress. Vital signs are significant for rapid respiratory rate. Oxygen saturation is 99%, which is normal. Head is normocephalic and atraumatic. PERRLA, EOMI. Oropharynx is clear. Neck is nontender and supple without adenopathy or JVD. Back is nontender and there is no CVA tenderness. Lungs are clear without rales, wheezes, or rhonchi. Chest is nontender. Heart has regular rate and rhythm without murmur. Abdomen is soft, flat, with moderate tenderness across the upper abdomen and into the right lower quadrant.  Maximum tenderness is in the right mid and lower abdomen.  There is no rebound or guarding.  There are no masses or hepatosplenomegaly and  peristalsis is hypoactive. Extremities have no cyanosis or edema, full range of motion is present. Skin is warm and dry without rash. Neurologic: Mental status is normal, cranial nerves are intact, there are no motor or sensory deficits.  ED Results / Procedures / Treatments   Labs (all labs ordered are listed, but only abnormal results are displayed) Labs Reviewed  COMPREHENSIVE METABOLIC PANEL - Abnormal; Notable for the following components:      Result Value   CO2 19 (*)    Glucose, Bld 112 (*)    All other components within normal limits  CBC WITH DIFFERENTIAL/PLATELET - Abnormal; Notable  for the following components:   WBC 18.8 (*)    RBC 5.17 (*)    MCV 79.5 (*)    MCH 25.5 (*)    RDW 16.0 (*)    Platelets 432 (*)    Neutro Abs 16.9 (*)    All other components within normal limits  URINALYSIS, ROUTINE W REFLEX MICROSCOPIC - Abnormal; Notable for the following components:   Hgb urine dipstick SMALL (*)    Bacteria, UA RARE (*)    All other components within normal limits  RESP PANEL BY RT-PCR (FLU A&B, COVID) ARPGX2  LIPASE, BLOOD  I-STAT BETA HCG BLOOD, ED ([facility], [facility], [facility] ONLY)  I-STAT BETA HCG BLOOD, ED ([facility], [facility], [facility] ONLY)    Radiology CT ABDOMEN PELVIS W CONTRAST  Result Date: 03/05/2020 CLINICAL DATA:  45 year old female with right lower quadrant pain, leukocytosis. History of ureteral stent. EXAM: CT ABDOMEN AND PELVIS WITH CONTRAST TECHNIQUE: Multidetector CT imaging of the abdomen and pelvis was performed using the standard protocol following bolus administration of intravenous contrast. CONTRAST:  OMNIPAQUE IOHEXOL 300 MG/ML  SOLN COMPARISON:  CT Abdomen and Pelvis 01/11/2020 and earlier. FINDINGS: Lower chest: Negative; minor atelectasis or scarring in the costophrenic angles. Hepatobiliary: Absent gallbladder. Stable liver with small round 11 mm circumscribed hypodensity at the dome which is likely a benign cyst. Pancreas: Negative. Spleen: Stable borderline  splenomegaly. Adrenals/Urinary Tract: Adrenal glands remain normal. Partially atrophied, lobulated right kidney with multifocal areas of cortical scarring. Stable right midpole cyst. No right hydronephrosis, nephrolithiasis or acute inflammatory stranding. Stable more normal appearance of the left kidney with preserved cortical thickness. Left nephrolithiasis with several calculi up to 4 mm is stable since October. Stable benign-appearing small hypodense areas which are likely cysts. No left hydronephrosis or inflammatory stranding. On delayed images renal contrast excretion is symmetric. Both ureters are decompressed. No new calcific density along the course of either ureter. Unremarkable urinary bladder. Stomach/Bowel: Large bowel is decompressed and negative from the splenic flexure distally. Both flexures are mildly redundant. Retained stool in the right colon. In December the appendix located medial to the cecum appeared mildly enlarged and airless, 10-11 mm at that time (12 mm in October). The appendix today is 12 mm on coronal image 63 and remains airless. But compared to December, there is also new mild Peri appendiceal inflammatory stranding as on coronal image 57. However, the appearance is not significantly different from an October CT Abdomen and Pelvis (coronal image 94 at that time). Appendix: Location: Medial to the cecum Diameter: 12 mm (although 10-11 mm previously) Appendicolith: None identified Mucosal hyper-enhancement: None Extraluminal gas: None Periappendiceal collection: None.  Mild inflammatory stranding only. Negative terminal ileum. No dilated small bowel. Negative stomach and duodenum. No free air or free fluid. Vascular/Lymphatic: Major arterial structures appear patent and normal. Portal venous system appears to be patent. No lymphadenopathy. Reproductive: Within normal limits.  Stable small phleboliths. Other: No pelvic free fluid. Musculoskeletal: No acute osseous abnormality  identified. IMPRESSION: 1. This patient's appendix has appeared mildly enlarged by CT since October. And although suggestion of mild para-appendiceal inflammatory stranding today is increased compared to a December scan, it is similar to the October CT. Cannot exclude mild or developing Appendicitis. 2. No other acute or inflammatory process in the abdomen and pelvis. Chronic right renal scarring. Left nephrolithiasis. Small benign hepatic and renal cysts suspected. Electronically Signed   By: Odessa Fleming M.D.   On: 03/05/2020 04:09    Procedures Procedures  Medications Ordered in ED Medications  cefTRIAXone (ROCEPHIN) 2 g in sodium chloride 0.9 % 100 mL IVPB (2 g Intravenous New Bag/Given 03/05/20 0441)  metroNIDAZOLE (FLAGYL) IVPB 500 mg (500 mg Intravenous New Bag/Given 03/05/20 0443)  lactated ringers bolus 1,000 mL (0 mLs Intravenous Stopped 03/05/20 0216)  morphine 4 MG/ML injection 4 mg (4 mg Intravenous Given 03/05/20 0109)  ondansetron (ZOFRAN) injection 4 mg (4 mg Intravenous Given 03/05/20 0109)  iohexol (OMNIPAQUE) 300 MG/ML solution 100 mL (100 mLs Intravenous Contrast Given 03/05/20 0327)    ED Course  I have reviewed the triage vital signs and the nursing notes.  Pertinent labs & imaging results that were available during my care of the patient were reviewed by me and considered in my medical decision making (see chart for details).  MDM Rules/Calculators/A&P Abdominal pain in patient with history of appendicitis treated with antibiotics.  This is certainly concerning for recurrent appendicitis.  Other diagnostic possibilities include diverticulitis, urolithiasis, pyelonephritis.  This differential includes conditions with significant morbidity and mortality.  Old records are reviewed confirming hospitalization on 11/03/2019 for acute appendicitis treated with antibiotics.  She also had an ED visit on 12/30 where she had CT evidence of recurrent appendicitis, admitted to the hospital  where general surgery felt that she actually did not have appendicitis at that time.  She will be given IV fluids, morphine, ondansetron and will get repeat CT of abdomen and pelvis.    She has had good pain relief with morphine.  Labs show significant leukocytosis with left shift.  Urinalysis does not have any ketones.  CT scan does show enlarged appendix with some stranding.  This is not clear-cut appendicitis.  However, given that this is her third presentation with CT findings suggestive of appendicitis, I feel that consideration should be given to doing an appendectomy.  Case is discussed with Dr. Juleen Starr surgery who agrees to come and evaluate the patient for possible admission and surgery.  She is started on ceftriaxone and metronidazole.  Final Clinical Impression(s) / ED Diagnoses Final diagnoses:  Acute appendicitis, unspecified acute appendicitis type    Rx / DC Orders ED Discharge Orders    None       Delora Fuel, MD 65/79/03 [phone]

## 2020-03-05 NOTE — Anesthesia Procedure Notes (Signed)
Procedure Name: Intubation Date/Time: 03/05/2020 6:57 PM Performed by: Minerva Ends, CRNA Pre-anesthesia Checklist: Patient identified, Emergency Drugs available, Suction available and Patient being monitored Patient Re-evaluated:Patient Re-evaluated prior to induction Oxygen Delivery Method: Circle System Utilized Preoxygenation: Pre-oxygenation with 100% oxygen Induction Type: IV induction Ventilation: Mask ventilation without difficulty Laryngoscope Size: Miller and 2 Grade View: Grade I Tube type: Oral Tube size: 7.0 mm Number of attempts: 1 Airway Equipment and Method: Stylet Placement Confirmation: ETT inserted through vocal cords under direct vision,  positive ETCO2 and breath sounds checked- equal and bilateral Secured at: 22 cm Tube secured with: Tape Dental Injury: Teeth and Oropharynx as per pre-operative assessment  Comments: Smooth RSI Donavan Foil-- intubation atraumatic-- teeth and mouth as preop- small chip front tooth unchanged with laryngoscopy- bilat BS Donavan Foil

## 2020-03-06 ENCOUNTER — Encounter (HOSPITAL_COMMUNITY): Payer: Self-pay | Admitting: Surgery

## 2020-03-06 MED ORDER — SERTRALINE HCL 100 MG PO TABS: 150.0000 mg | ORAL_TABLET | Freq: Every day | ORAL | Status: DC

## 2020-03-06 MED ORDER — ACETAMINOPHEN 500 MG PO TABS: ORAL_TABLET | ORAL | Status: DC

## 2020-03-06 MED ORDER — IBUPROFEN 200 MG PO TABS: ORAL_TABLET | ORAL | Status: DC

## 2020-03-06 MED ORDER — OXYCODONE HCL 5 MG PO TABS: ORAL_TABLET | ORAL | 0 refills | Status: DC

## 2020-03-06 MED ORDER — IBUPROFEN 400 MG PO TABS: 600.0000 mg | ORAL_TABLET | Freq: Four times a day (QID) | ORAL | Status: DC | PRN

## 2020-03-06 NOTE — Discharge Summary (Signed)
Physician Discharge Summary  Patient ID: Stephanie Davies MRN: [id number] DOB/AGE: 10-10-1975 45 y.o.  Admit date: 03/05/2020 Discharge date: 03/06/2020  Admission Diagnoses:  Acute appendicitis BMI 45  Active Problems:   Acute appendicitis   PROCEDURES: Laparoscopic appendectomy 03/05/2020 Dr. Ivar Drape   Discharge Diagnoses:  Same   Hospital Course:   Stephanie Davies is an 45 y.o. female who is here for recurrent abdominal pain.  She was diagnosed with appendicitis about a month ago.  Unfortunately she was diagnosed with Covid at the same time.  We elected to treat her with antibiotics as it appeared she was a good candidate.  Unfortunately she is return to the emergency room multiple times with recurrent abdominal pain.  Her abdominal pain is diffuse throughout the abdomen.  She is fed up with antibiotics and like her appendix removed. Patient was seen by Dr. Dossie Der he and taken the operating room the same day.  Patient tolerated the procedure well and returned to the floor in satisfactory condition.  First postoperative morning she was mobilized, tolerating a soft diet, voiding, pain was controlled with p.o. pain medications.  She was ready for discharge later that AM.  Condition on discharge: Improved  CBC Latest Ref Rng & Units 03/05/2020 01/10/2020 01/09/2020  WBC 4.0 - 10.5 K/uL 18.8(H) 7.3 17.0(H)  Hemoglobin 12.0 - 15.0 g/dL 81.4 48.1 85.6  Hematocrit 36.0 - 46.0 % 41.1 40.5 41.1  Platelets 150 - 400 K/uL 432(H) 366 446(H)    CMP Latest Ref Rng & Units 03/05/2020 01/10/2020 01/09/2020  Glucose 70 - 99 mg/dL 314(H) 89 97  BUN 6 - 20 mg/dL 8 6 9   Creatinine 0.44 - 1.00 mg/dL 7.02 6.37  Sodium 135 - 145 mmol/L 138 138 138  Potassium 3.5 - 5.1 mmol/L 4.0 3.7 3.7  Chloride 98 - 111 mmol/L 107 106 108  CO2 22 - 32 mmol/L 19(L) 22 18(L)  Calcium 8.9 - 10.3 mg/dL 9.3 9.1 9.1  Total Protein 6.5 - 8.1 g/dL 7.4 6.7 7.4  Total Bilirubin 0.3 - 1.2 mg/dL 0.8  0.5 0.7  Alkaline Phos 38 - 126 U/L 101 100 107  AST 15 - 41 U/L 21 19 22   ALT 0 - 44 U/L 21 23 24    Condition on discharge: Improved  Disposition: Discharge disposition: 01-Home or Self Care        Allergies as of 03/06/2020   No Known Allergies     Medication List    TAKE these medications   acetaminophen 500 MG tablet Commonly known as: TYLENOL You can take 2 tablets every 6 hours as needed for pain.  You can alternate with plain ibuprofen.  You can buy this over-the-counter at any drugstore.  Do not take more than 4000 mg of Tylenol/acetaminophen per day, it can harm your liver.   aspirin-acetaminophen-caffeine 250-250-65 MG tablet Commonly known as: EXCEDRIN MIGRAINE Take 2 tablets by mouth as needed for migraine.   ibuprofen 200 MG tablet Commonly known as: ADVIL You can take 2 to 3 tablets every 6 hours as needed for pain.  You can alternate with plain Tylenol/acetaminophen.  You can buy this over-the-counter at any drugstore without a prescription.   oxyCODONE 5 MG immediate release tablet Commonly known as: Oxy IR/ROXICODONE You can take 1 tablet every 6 hours for pain not relieved by plain Tylenol and ibuprofen.   pantoprazole 40 MG tablet Commonly known as: PROTONIX Take 40 mg by mouth every morning.   sertraline 100 MG tablet  Commonly known as: Zoloft Take 1.5 tablets (150 mg total) by mouth daily.       Follow-up Information    [facility], [place]. Go on 03/25/2020.   Specialty: General Surgery Why: Your appointment is 03/15 at 10:15 am Please arrive 30 minutes prior to your appointment to check in and fill out paperwork. Bring photo ID and insurance information. Contact information: [address] [place] [address] [phone]              Signed: Sherrie George 03/06/2020, 4:28 PM

## 2020-03-06 NOTE — Progress Notes (Signed)
D/C instructions given to patient. Patient had no questions. NT or writer will wheel patient out  

## 2020-03-06 NOTE — Progress Notes (Signed)
.   1 Day Post-Op    CC: Abdominal pain  Subjective: She looks good this AM.  Tolerating diet.  We will plan discharge later today.  Port sites all look fine. Objective: Vital signs in last 24 hours: Temp:  [97.9 F (36.6 C)-98.4 F (36.9 C)] 98 F (36.7 C) (02/24 0628) Pulse Rate:  [63-86] 63 (02/24 0628) Resp:  [14-28] 16 (02/24 0628) BP: (117-139)/(64-80) 118/66 (02/24 0628) SpO2:  [92 %-100 %] 96 % (02/24 0628) Last BM Date: 03/04/20 P.o. 60 IV 1356 No urine or BM recorded. Afebrile, vital signs are stable No labs  Intake/Output from previous day: 02/23 0701 - 02/24 0700 In: 1416.7 [P.O.:60; I.V.:1356.7] Out: 10 [Blood:10] Intake/Output this shift: No intake/output data recorded.  General appearance: alert, cooperative and no distress Resp: clear to auscultation bilaterally GI: Soft, sore, port sites look fine.  Lab Results:  Recent Labs    03/05/20 0058  WBC 18.8*  HGB 13.2  HCT 41.1  PLT 432*    BMET Recent Labs    03/05/20 0058  NA 138  K 4.0  CL 107  CO2 19*  GLUCOSE 112*  BUN 8  CREATININE 0.81  CALCIUM 9.3   PT/INR No results for input(s): LABPROT, INR in the last 72 hours.  Recent Labs  Lab 03/05/20 0058  AST 21  ALT 21  ALKPHOS 101  BILITOT 0.8  PROT 7.4  ALBUMIN 3.7     Lipase     Component Value Date/Time   LIPASE 22 03/05/2020 0058     Prior to Admission medications   Medication Sig Start Date End Date Taking? Authorizing Provider  aspirin-acetaminophen-caffeine (EXCEDRIN MIGRAINE) [phone] MG tablet Take 2 tablets by mouth as needed for migraine.   Yes [provider]  pantoprazole (PROTONI  X) 40 MG tablet Take 40 mg by mouth every morning. 02/10/20  Yes [provider]  sertraline (ZOLOFT) 100 MG tablet 1 tablet daily for 2 weeks, then 1.5 tablets daily Patient taking differently: Take 150 mg by mouth daily. 10/25/19  Yes Wanda Plump, MD    Medications: . acetaminophen  650 mg Oral Q6H  .  enoxaparin (LOVENOX) injection  40 mg Subcutaneous Q24H  . gabapentin  300 mg Oral TID  . ketorolac  15 mg Intravenous Q8H  . pantoprazole  40 mg Oral q morning  . sertraline  150 mg Oral Daily     Assessment/Plan  Acute appendicitis Laparoscopic appendectomy 03/05/2020 Dr. Ivar Drape, POD #1  FEN: Regular diet ID: Ancef/Rocephin/Flagyl DVT: Lovenox Follow-up: [facility]   Plan: Discharge home today.    LOS: 0 days    Mahki Spikes 03/06/2020 Please see Amion

## 2020-03-07 LAB — SURGICAL PATHOLOGY

## 2020-03-23 ENCOUNTER — Emergency Department (HOSPITAL_COMMUNITY)
Admission: EM | Admit: 2020-03-23 | Discharge: 2020-03-23 | Disposition: A | Discharge status: Home / Self Care | Payer: Medicaid Other | Attending: Emergency Medicine | Admitting: Emergency Medicine

## 2020-03-23 ENCOUNTER — Emergency Department (HOSPITAL_COMMUNITY): Payer: Medicaid Other

## 2020-03-23 ENCOUNTER — Encounter (HOSPITAL_COMMUNITY): Payer: Self-pay | Admitting: Emergency Medicine

## 2020-03-23 DIAGNOSIS — R112 Nausea with vomiting, unspecified: Secondary | ICD-10-CM | POA: Diagnosis not present

## 2020-03-23 DIAGNOSIS — R9431 Abnormal electrocardiogram [ECG] [EKG]: Secondary | ICD-10-CM | POA: Diagnosis not present

## 2020-03-23 DIAGNOSIS — R1012 Left upper quadrant pain: Secondary | ICD-10-CM | POA: Diagnosis not present

## 2020-03-23 DIAGNOSIS — R197 Diarrhea, unspecified: Secondary | ICD-10-CM | POA: Diagnosis not present

## 2020-03-23 DIAGNOSIS — K219 Gastro-esophageal reflux disease without esophagitis: Secondary | ICD-10-CM | POA: Diagnosis not present

## 2020-03-23 DIAGNOSIS — N281 Cyst of kidney, acquired: Secondary | ICD-10-CM | POA: Diagnosis not present

## 2020-03-23 DIAGNOSIS — N200 Calculus of kidney: Secondary | ICD-10-CM | POA: Diagnosis not present

## 2020-03-23 LAB — COMPREHENSIVE METABOLIC PANEL
ALT: 26 U/L (ref 0–44)
AST: 24 U/L (ref 15–41)
Albumin: 3.8 g/dL (ref 3.5–5.0)
Alkaline Phosphatase: 105 U/L (ref 38–126)
Anion gap: 10 (ref 5–15)
BUN: 8 mg/dL (ref 6–20)
CO2: 23 mmol/L (ref 22–32)
Calcium: 9.5 mg/dL (ref 8.9–10.3)
Chloride: 105 mmol/L (ref 98–111)
Creatinine, Ser: 0.86 mg/dL (ref 0.44–1.00)
GFR, Estimated: >60 mL/min (ref >60)
Glucose, Bld: 72 mg/dL (ref 70–99)
Potassium: 3.8 mmol/L (ref 3.5–5.1)
Sodium: 138 mmol/L (ref 135–145)
Total Bilirubin: 0.6 mg/dL (ref 0.3–1.2)
Total Protein: 7.7 g/dL (ref 6.5–8.1)

## 2020-03-23 LAB — CBC WITH DIFFERENTIAL/PLATELET
Abs Immature Granulocytes: 0.04 10*3/uL (ref 0.00–0.07)
Basophils Absolute: 0 10*3/uL (ref 0.0–0.1)
Basophils Relative: 0 %
Eosinophils Absolute: 0.1 10*3/uL (ref 0.0–0.5)
Eosinophils Relative: 2 %
HCT: 41.9 % (ref 36.0–46.0)
Hemoglobin: 13.5 g/dL (ref 12.0–15.0)
Immature Granulocytes: 1 %
Lymphocytes Relative: 21 %
Lymphs Abs: 1.8 10*3/uL (ref 0.7–4.0)
MCH: 26.2 pg (ref 26.0–34.0)
MCHC: 32.2 g/dL (ref 30.0–36.0)
MCV: 81.4 fL (ref 80.0–100.0)
Monocytes Absolute: 0.4 10*3/uL (ref 0.1–1.0)
Monocytes Relative: 4 %
Neutro Abs: 6.2 10*3/uL (ref 1.7–7.7)
Neutrophils Relative %: 72 %
Platelets: 443 10*3/uL — ABNORMAL HIGH (ref 150–400)
RBC: 5.15 MIL/uL — ABNORMAL HIGH (ref 3.87–5.11)
RDW: 15.9 % — ABNORMAL HIGH (ref 11.5–15.5)
WBC: 8.6 10*3/uL (ref 4.0–10.5)
nRBC: 0 % (ref 0.0–0.2)

## 2020-03-23 LAB — URINALYSIS, ROUTINE W REFLEX MICROSCOPIC
Bilirubin Urine: NEGATIVE
Glucose, UA: NEGATIVE mg/dL
Ketones, ur: NEGATIVE mg/dL
Leukocytes,Ua: NEGATIVE
Nitrite: NEGATIVE
Protein, ur: NEGATIVE mg/dL
Specific Gravity, Urine: 1.015 (ref 1.005–1.030)
pH: 5 (ref 5.0–8.0)

## 2020-03-23 LAB — I-STAT BETA HCG BLOOD, ED (MC, WL, AP ONLY): I-stat hCG, quantitative: <5 m[IU]/mL (ref <5)

## 2020-03-23 LAB — LIPASE, BLOOD: Lipase: 28 U/L (ref 11–51)

## 2020-03-23 MED ORDER — ONDANSETRON HCL 4 MG/2ML IJ SOLN
4.0000 mg | Freq: Once | INTRAMUSCULAR | Status: AC
  Administered 2020-03-23: 4 mg via INTRAVENOUS
  Filled 2020-03-23: qty 2

## 2020-03-23 MED ORDER — FAMOTIDINE IN NACL 20-0.9 MG/50ML-% IV SOLN
20.0000 mg | Freq: Once | INTRAVENOUS | Status: AC
  Administered 2020-03-23: 20 mg via INTRAVENOUS
  Filled 2020-03-23: qty 50

## 2020-03-23 MED ORDER — SODIUM CHLORIDE 0.9 % IV BOLUS
1000.0000 mL | Freq: Once | INTRAVENOUS | Status: AC
  Administered 2020-03-23: 1000 mL via INTRAVENOUS

## 2020-03-23 MED ORDER — IOHEXOL 300 MG/ML  SOLN
100.0000 mL | Freq: Once | INTRAMUSCULAR | Status: AC | PRN
  Administered 2020-03-23: 100 mL via INTRAVENOUS

## 2020-03-23 MED ORDER — MORPHINE SULFATE (PF) 4 MG/ML IV SOLN
4.0000 mg | Freq: Once | INTRAVENOUS | Status: AC
  Administered 2020-03-23: 4 mg via INTRAVENOUS
  Filled 2020-03-23: qty 1

## 2020-03-23 MED ORDER — FAMOTIDINE 20 MG PO TABS: 20.0000 mg | ORAL_TABLET | Freq: Two times a day (BID) | ORAL | 0 refills | Status: DC

## 2020-03-23 NOTE — Discharge Instructions (Signed)
Please return for any problem.  °

## 2020-03-23 NOTE — ED Triage Notes (Signed)
Patient c/o LUQ pain with N/V/D x5 hours. Appendectomy x2 weeks ago.

## 2020-03-23 NOTE — ED Provider Notes (Signed)
[facility]-EMERGENCY DEPT Provider Note   CSN: [id number] Arrival date & time: 03/23/20  1655     History Chief Complaint  Patient presents with  . Abdominal Pain    Stephanie Davies is a 45 y.o. female.  45 year old female with prior medical history as detailed presents for evaluation of left upper quadrant abdominal pain.  Reports recent appendectomy approximately 2 weeks ago.  She was doing well post that procedure.  She reports acute onset of left upper quadrant abdominal discomfort this afternoon.  This was associated with nausea, vomiting and some loose diarrheal stool.  She denies any hematemesis or blood in her stool.  She denies fever.  She not take anything for her symptoms.  She reports that with her appendicitis she had left-sided pain as well.  The history is provided by the patient and medical records.  Abdominal Pain Pain location:  LUQ Pain quality: aching   Pain radiates to:  Does not radiate Pain severity:  Mild Onset quality:  Sudden Duration:  2 hours Timing:  Constant Progression:  Waxing and waning Chronicity:  Recurrent Relieved by:  Nothing Worsened by:  Nothing      Past Medical History:  Diagnosis Date  . Alopecia   . Anal fistula   . Anxiety and depression 03/07/2014  . GERD (gastroesophageal reflux disease) 02/14/2014  . Goiter 03/07/2014  . Headache(784.0)   . Hemorrhoids   . Kidney stone   . Pseudotumor cerebri     Patient Active Problem List   Diagnosis Date Noted  . Acute appendicitis 03/05/2020  . Appendicitis, acute 01/11/2020  . OSA (obstructive sleep apnea) 12/01/2019  . Obesity, Class III, BMI 40-49.9 (morbid obesity) (HCC) 11/29/2019  . Annual physical exam 11/29/2019  . Abdominal pain 11/03/2019  . IBS (irritable bowel syndrome) 07/23/2019  . History of kidney stones 07/23/2019  . Pseudotumor cerebri   . Anxiety 05/13/2016  . Cough 04/11/2015  . PCP NOTES >>>>>>>>>>>>>>>>>>>>>>>>>>>>>>>>>>  09/20/2014  . Anxiety and depression 03/07/2014  . Goiter 03/07/2014  . Hives 02/20/2014  . GERD (gastroesophageal reflux disease) 02/14/2014  . Upper airway cough syndrome 01/22/2014  . Eczema, allergic 01/09/2014  . Dyspnea 01/09/2014  . Hip pain 10/15/2013  . Pain in joint, pelvic region and thigh 10/08/2013  . External bleeding hemorrhoids 10/08/2013  . Rh negative status during pregnancy 06/06/2013    Past Surgical History:  Procedure Laterality Date  . CHOLECYSTECTOMY  2004  . kidney stent: 12/2018 at [facility]    . LAPAROSCOPIC APPENDECTOMY N/A 03/05/2020   Procedure: APPENDECTOMY LAPAROSCOPIC;  Surgeon: Quentin Ore, MD;  Location: [facility];  Service: General;  Laterality: N/A;     OB History    Gravida  1   Para  1   Term  1   Preterm      AB      Living  1     SAB      IAB      Ectopic      Multiple      Live Births  1           Family History  Problem Relation Age of Onset  . Diabetes Mother   . Hypertension Mother   . Esophageal cancer Maternal Grandmother   . Esophageal cancer Maternal Grandfather   . Diabetes Maternal Grandfather   . Colon cancer Neg Hx   . Colon polyps Neg Hx   . Gallbladder disease Neg Hx   . Kidney disease  Neg Hx     Social History   Tobacco Use  . Smoking status: Never Smoker  . Smokeless tobacco: Never Used  Vaping Use  . Vaping Use: Never used  Substance Use Topics  . Alcohol use: No  . Drug use: No    Home Medications Prior to Admission medications   Medication Sig Start Date End Date Taking? Authorizing Provider  acetaminophen (TYLENOL) 500 MG tablet You can take 2 tablets every 6 hours as needed for pain.  You can alternate with plain ibuprofen.  You can buy this over-the-counter at any drugstore.  Do not take more than 4000 mg of Tylenol/acetaminophen per day, it can harm your liver. 03/06/20   Sherrie GeorgeJennings, Willard, PA-C  aspirin-acetaminophen-caffeine (EXCEDRIN MIGRAINE) [phone] MG tablet Take 2  tablets by mouth as needed for migraine.    [provider]  ibuprofen (ADVIL) 200 MG tablet You can take 2 to 3 tablets every 6 hours as needed for pain.  You can alternate with plain Tylenol/acetaminophen.  You can buy this over-the-counter at any drugstore without a prescription. 03/06/20   Sherrie GeorgeJennings, Willard, PA-C  oxyCODONE (OXY IR/ROXICODONE) 5 MG immediate release tablet You can take 1 tablet every 6 hours for pain not relieved by plain Tylenol and ibuprofen. 03/06/20   Sherrie GeorgeJennings, Willard, PA-C  pantoprazole (PROTONIX) 40 MG tablet Take 40 mg by mouth every morning. 02/10/20   [provider]  sertraline (ZOLOFT) 100 MG tablet Take 1.5 tablets (150 mg total) by mouth daily. 03/06/20   Sherrie GeorgeJennings, Willard, PA-C    Allergies    Patient has no known allergies.  Review of Systems   Review of Systems  Gastrointestinal: Positive for abdominal pain.  All other systems reviewed and are negative.   Physical Exam Updated Vital Signs BP (!) 148/84 (BP Location: Left Arm)   Pulse 89   Temp 97.9 F (36.6 C) (Oral)   Resp 18   LMP 02/24/2020 Comment: negative beta HCG 03/05/20  SpO2 100%   Physical Exam Vitals and nursing note reviewed.  Constitutional:      General: She is not in acute distress.    Appearance: She is well-developed.  HENT:     Head: Normocephalic and atraumatic.  Eyes:     Conjunctiva/sclera: Conjunctivae normal.     Pupils: Pupils are equal, round, and reactive to light.  Cardiovascular:     Rate and Rhythm: Normal rate and regular rhythm.     Heart sounds: Normal heart sounds.  Pulmonary:     Effort: Pulmonary effort is normal. No respiratory distress.     Breath sounds: Normal breath sounds.  Abdominal:     General: There is no distension.     Palpations: Abdomen is soft.     Tenderness: There is abdominal tenderness in the left upper quadrant.  Musculoskeletal:        General: No deformity. Normal range of motion.     Cervical back: Normal range  of motion and neck supple.  Skin:    General: Skin is warm and dry.  Neurological:     Mental Status: She is alert and oriented to person, place, and time.     ED Results / Procedures / Treatments   Labs (all labs ordered are listed, but only abnormal results are displayed) Labs Reviewed  CBC WITH DIFFERENTIAL/PLATELET - Abnormal; Notable for the following components:      Result Value   RBC 5.15 (*)    RDW 15.9 (*)    Platelets  443 (*)    All other components within normal limits  COMPREHENSIVE METABOLIC PANEL  LIPASE, BLOOD  URINALYSIS, ROUTINE W REFLEX MICROSCOPIC  I-STAT BETA HCG BLOOD, ED ([facility], [facility], [facility] ONLY)    EKG EKG Interpretation  Date/Time:  Sunday March 23 2020 19:24:17 EDT Ventricular Rate:  61 PR Interval:    QRS Duration: 110 QT Interval:  395 QTC Calculation: 398 R Axis:   31 Text Interpretation: Sinus rhythm Low voltage, precordial leads Abnormal R-wave progression, early transition Confirmed by Kristine Royal [phone]) on 03/23/2020 7:42:29 PM   Radiology CT ABDOMEN PELVIS W CONTRAST  Result Date: 03/23/2020 CLINICAL DATA:  Left upper quadrant pain, nausea/vomiting/diarrhea for 5 hours, appendectomy 2 weeks ago EXAM: CT ABDOMEN AND PELVIS WITH CONTRAST TECHNIQUE: Multidetector CT imaging of the abdomen and pelvis was performed using the standard protocol following bolus administration of intravenous contrast. CONTRAST:  OMNIPAQUE IOHEXOL 300 MG/ML  SOLN COMPARISON:  03/05/2020 FINDINGS: Lower chest: No acute pleural or parenchymal lung disease. Hepatobiliary: Stable small hepatic hypodensities, largest in the superior right lobe, most likely cysts. No biliary dilation. Gallbladder is surgically absent. Pancreas: Unremarkable. No pancreatic ductal dilatation or surrounding inflammatory changes. Spleen: Stable borderline splenomegaly.  No focal abnormality. Adrenals/Urinary Tract: Chronic right renal cortical scarring unchanged. Stable 1.4 cm hypodensity  compatible with cyst. No urinary tract calculi or obstructive uropathy. The left kidney demonstrates grossly normal enhancement with no evidence of scarring. Punctate less than 4 mm nonobstructing left renal calculi are stable. Stable 1.6 cm left renal cyst. No obstructive uropathy. Bladder is minimally distended, limiting its evaluation. The adrenals are normal. Stomach/Bowel: No bowel obstruction or ileus. The appendix is surgically absent. No bowel wall thickening or inflammatory change. Vascular/Lymphatic: No significant vascular findings are present. No enlarged abdominal or pelvic lymph nodes. Reproductive: Uterus and bilateral adnexa are unremarkable. Other: No free fluid or free gas.  No abdominal wall hernia. Musculoskeletal: No acute or destructive bony lesions. Reconstructed images demonstrate no additional findings. IMPRESSION: 1. Stable punctate nonobstructing left renal calculi. 2. Interval appendectomy, with no evidence of postsurgical complication. 3. Otherwise no acute intra-abdominal or intrapelvic process. 4. Stable borderline splenomegaly. 5. Stable right renal cortical scarring. Electronically Signed   By: Sharlet Salina M.D.   On: 03/23/2020 21:44    Procedures Procedures   Medications Ordered in ED Medications  sodium chloride 0.9 % bolus 1,000 mL (has no administration in time range)  ondansetron (ZOFRAN) injection 4 mg (has no administration in time range)  morphine 4 MG/ML injection 4 mg (has no administration in time range)  famotidine (PEPCID) IVPB 20 mg premix (has no administration in time range)    ED Course  I have reviewed the triage vital signs and the nursing notes.  Pertinent labs & imaging results that were available during my care of the patient were reviewed by me and considered in my medical decision making (see chart for details).    MDM Rules/Calculators/A&P                          MDM  Screen complete  Stephanie Davies was evaluated in Emergency  Department on 03/23/2020 for the symptoms described in the history of present illness. She was evaluated in the context of the global COVID-19 pandemic, which necessitated consideration that the patient might be at risk for infection with the SARS-CoV-2 virus that causes COVID-19. Institutional protocols and algorithms that pertain to the evaluation of patients at risk  for COVID-19 are in a state of rapid change based on information released by regulatory bodies including the CDC and federal and state organizations. These policies and algorithms were followed during the patient's care in the ED.  Presenting for evaluation of reported left upper quadrant abdominal pain.  Patient's work-up was without evidence of significant acute pathology.  Patient described symptoms are perhaps most consistent with likely GERD versus gastritis.  Patient is advised to follow-up closely with her already established GI care.  Importance of close follow-up is stressed.  Strict return precautions given and understood.  Final Clinical Impression(s) / ED Diagnoses Final diagnoses:  Left upper quadrant abdominal pain    Rx / DC Orders ED Discharge Orders         Ordered    famotidine (PEPCID) 20 MG tablet  2 times daily        03/23/20 2251           Wynetta Fines, MD 03/23/20 2252

## 2020-04-03 ENCOUNTER — Encounter: Payer: Self-pay | Admitting: Internal Medicine

## 2020-04-30 ENCOUNTER — Other Ambulatory Visit: Payer: Self-pay | Admitting: Internal Medicine

## 2020-05-19 ENCOUNTER — Telehealth: Payer: Self-pay | Admitting: Internal Medicine

## 2020-05-19 DIAGNOSIS — E041 Nontoxic single thyroid nodule: Secondary | ICD-10-CM

## 2020-05-19 DIAGNOSIS — E049 Nontoxic goiter, unspecified: Secondary | ICD-10-CM

## 2020-05-19 NOTE — Telephone Encounter (Signed)
Order placed. Pt made aware 

## 2020-05-19 NOTE — Telephone Encounter (Signed)
Patient is overdue for a thyroid biopsy, please arrange.

## 2020-05-29 ENCOUNTER — Encounter: Payer: Medicaid Other | Admitting: Internal Medicine

## 2020-06-02 NOTE — Patient Instructions (Addendum)
We will order CPAP therapy for you to begin asap. You will hear back from a durable medical equipment company to set up your CPAP.  While your insurance requires that you use CPAP at least 4 hours each night on 70% of the nights, I recommend, that you not skip any nights and use it throughout the night if you can. Getting used to CPAP and staying with the treatment long term does take time and patience and discipline. Untreated obstructive sleep apnea when it is moderate to severe can have an adverse impact on cardiovascular health and raise her risk for heart disease, arrhythmias, hypertension, congestive heart failure, stroke and diabetes. Untreated obstructive sleep apnea causes sleep disruption, nonrestorative sleep, and sleep deprivation. This can have an impact on your day to day functioning and cause daytime sleepiness and impairment of cognitive function, memory loss, mood disturbance, and problems focussing. Using CPAP regularly can improve these symptoms.  Please schedule an eye exam asap with an ophthalmologist.   I will talk to Dr Vickey Huger regarding lumbar puncture versus trial of Diamox (acetazolamide)   Please work closely with your primary care provider for weight management.    Sleep Apnea Sleep apnea affects breathing during sleep. It causes breathing to stop for a short time or to become shallow. It can also increase the risk of:  Heart attack.  Stroke.  Being very overweight (obese).  Diabetes.  Heart failure.  Irregular heartbeat. The goal of treatment is to help you breathe normally again. What are the causes? There are three kinds of sleep apnea:  Obstructive sleep apnea. This is caused by a blocked or collapsed airway.  Central sleep apnea. This happens when the brain does not send the right signals to the muscles that control breathing.  Mixed sleep apnea. This is a combination of obstructive and central sleep apnea. The most common cause of this condition is  a collapsed or blocked airway. This can happen if:  Your throat muscles are too relaxed.  Your tongue and tonsils are too large.  You are overweight.  Your airway is too small.   What increases the risk?  Being overweight.  Smoking.  Having a small airway.  Being older.  Being female.  Drinking alcohol.  Taking medicines to calm yourself (sedatives or tranquilizers).  Having family members with the condition. What are the signs or symptoms?  Trouble staying asleep.  Being sleepy or tired during the day.  Getting angry a lot.  Loud snoring.  Headaches in the morning.  Not being able to focus your mind (concentrate).  Forgetting things.  Less interest in sex.  Mood swings.  Personality changes.  Feelings of sadness (depression).  Waking up a lot during the night to pee (urinate).  Dry mouth.  Sore throat. How is this diagnosed?  Your medical history.  A physical exam.  A test that is done when you are sleeping (sleep study). The test is most often done in a sleep lab but may also be done at home. How is this treated?  Sleeping on your side.  Using a medicine to get rid of mucus in your nose (decongestant).  Avoiding the use of alcohol, medicines to help you relax, or certain pain medicines (narcotics).  Losing weight, if needed.  Changing your diet.  Not smoking.  Using a machine to open your airway while you sleep, such as: ? An oral appliance. This is a mouthpiece that shifts your lower jaw forward. ? A CPAP device. This  device blows air through a mask when you breathe out (exhale). ? An EPAP device. This has valves that you put in each nostril. ? A BPAP device. This device blows air through a mask when you breathe in (inhale) and breathe out.  Having surgery if other treatments do not work. It is important to get treatment for sleep apnea. Without treatment, it can lead to:  High blood pressure.  Coronary artery disease.  In men,  not being able to have an erection (impotence).  Reduced thinking ability.   Follow these instructions at home: Lifestyle  Make changes that your doctor recommends.  Eat a healthy diet.  Lose weight if needed.  Avoid alcohol, medicines to help you relax, and some pain medicines.  Do not use any products that contain nicotine or tobacco, such as cigarettes, e-cigarettes, and chewing tobacco. If you need help quitting, ask your doctor. General instructions  Take over-the-counter and prescription medicines only as told by your doctor.  If you were given a machine to use while you sleep, use it only as told by your doctor.  If you are having surgery, make sure to tell your doctor you have sleep apnea. You may need to bring your device with you.  Keep all follow-up visits as told by your doctor. This is important. Contact a doctor if:  The machine that you were given to use during sleep bothers you or does not seem to be working.  You do not get better.  You get worse. Get help right away if:  Your chest hurts.  You have trouble breathing in enough air.  You have an uncomfortable feeling in your back, arms, or stomach.  You have trouble talking.  One side of your body feels weak.  A part of your face is hanging down. These symptoms may be an emergency. Do not wait to see if the symptoms will go away. Get medical help right away. Call your local emergency services (911 in the [place] Do not drive yourself to the hospital. Summary  This condition affects breathing during sleep.  The most common cause is a collapsed or blocked airway.  The goal of treatment is to help you breathe normally while you sleep. This information is not intended to replace advice given to you by your health care provider. Make sure you discuss any questions you have with your health care provider. Document Revised: 10/14/2017 Document Reviewed: 08/23/2017 Elsevier Patient Education  2021  [employer]   CPAP and BPAP Information CPAP and BPAP are methods that use air pressure to keep your airways open and to help you breathe well. CPAP and BPAP use different amounts of pressure. Your health care provider will tell you whether CPAP or BPAP would be more helpful for you.  CPAP stands for "continuous positive airway pressure." With CPAP, the amount of pressure stays the same while you breathe in and out.  BPAP stands for "bi-level positive airway pressure." With BPAP, the amount of pressure will be higher when you breathe in (inhale) and lower when you breathe out(exhale). This allows you to take larger breaths. CPAP or BPAP may be used in the hospital, or your health care provider may want you to use it at home. You may need to have a sleep study before your health care provider can order a machine for you to use at home. Why are CPAP and BPAP treatments used? CPAP or BPAP can be helpful if you have:  Sleep apnea.  Chronic  obstructive pulmonary disease (COPD).  Heart failure.  Medical conditions that cause muscle weakness, including muscular dystrophy or amyotrophic lateral sclerosis (ALS).  Other problems that cause breathing to be shallow, weak, abnormal, or difficult. CPAP and BPAP are most commonly used for obstructive sleep apnea (OSA) to keep the airways from collapsing when the muscles relax during sleep. How is CPAP or BPAP administered? Both CPAP and BPAP are provided by a small machine with a flexible plastic tube that attaches to a plastic mask that you wear. Air is blown through the mask into your nose or mouth. The amount of pressure that is used to blow the air can be adjusted on the machine. Your health care provider will set the pressure setting and help you find the best mask for you. When should CPAP or BPAP be used? In most cases, the mask only needs to be worn during sleep. Generally, the mask needs to be worn throughout the night and during any daytime  naps. People with certain medical conditions may also need to wear the mask at other times when they are awake. Follow instructions from your health care provider about when to use the machine. What are some tips for using the mask?  Because the mask needs to be snug, some people feel trapped or closed-in (claustrophobic) when first using the mask. If you feel this way, you may need to get used to the mask. One way to do this is to hold the mask loosely over your nose or mouth and then gradually apply the mask more snugly. You can also gradually increase the amount of time that you use the mask.  Masks are available in various types and sizes. If your mask does not fit well, talk with your health care provider about getting a different one. Some common types of masks include: ? Full face masks, which fit over the mouth and nose. ? Nasal masks, which fit over the nose. ? Nasal pillow or prong masks, which fit into the nostrils.  If you are using a mask that fits over your nose and you tend to breathe through your mouth, a chin strap may be applied to help keep your mouth closed.  Some CPAP and BPAP machines have alarms that may sound if the mask comes off or develops a leak.  If you have trouble with the mask, it is very important that you talk with your health care provider about finding a way to make the mask easier to tolerate. Do not stop using the mask. There could be a negative impact to your health if you stop using the mask.   What are some tips for using the machine?  Place your CPAP or BPAP machine on a secure table or stand near an electrical outlet.  Know where the on/off switch is on the machine.  Follow instructions from your health care provider about how to set the pressure on your machine and when you should use it.  Do not eat or drink while the CPAP or BPAP machine is on. Food or fluids could get pushed into your lungs by the pressure of the CPAP or BPAP.  For home use,  CPAP and BPAP machines can be rented or purchased through home health care companies. Many different brands of machines are available. Renting a machine before purchasing may help you find out which particular machine works well for you. Your insurance may also decide which machine you may get.  Keep the CPAP or BPAP machine  and attachments clean. Ask your health care provider for specific instructions. Follow these instructions at home:  Do not use any products that contain nicotine or tobacco, such as cigarettes, e-cigarettes, and chewing tobacco. If you need help quitting, ask your health care provider.  Keep all follow-up visits as told by your health care provider. This is important. Contact a health care provider if:  You have redness or pressure sores on your head, face, mouth, or nose from the mask or head gear.  You have trouble using the CPAP or BPAP machine.  You cannot tolerate wearing the CPAP or BPAP mask.  Someone tells you that you snore even when wearing your CPAP or BPAP. Get help right away if:  You have trouble breathing.  You feel confused. Summary  CPAP and BPAP are methods that use air pressure to keep your airways open and to help you breathe well.  You may need to have a sleep study before your health care provider can order a machine for home use.  If you have trouble with the mask, it is very important that you talk with your health care provider about finding a way to make the mask easier to tolerate. Do not stop using the mask. There could be a negative impact to your health if you stop using the mask.  Follow instructions from your health care provider about when to use the machine. This information is not intended to replace advice given to you by your health care provider. Make sure you discuss any questions you have with your health care provider. Document Revised: 01/19/2019 Document Reviewed: 01/22/2019 Elsevier Patient Education  2021 Elsevier  Inc.   Idiopathic Intracranial Hypertension  Idiopathic intracranial hypertension (IIH) is a condition that increases pressure around the brain. The fluid that surrounds the brain and spinal cord (cerebrospinal fluid, or CSF) increases and causes the pressure. Idiopathic means that the cause of this condition is not known. IIH affects the brain and spinal cord (neurological disorder). If this condition is not treated, it can cause vision loss or blindness. What are the causes? The cause of this condition is not known. What increases the risk? The following factors may make you more likely to develop this condition:  Being very overweight (obese).  Being a female between the ages of 24 and 25 years old, who has not gone through menopause.  Taking certain medicines, such as birth control or steroids. What are the signs or symptoms? Symptoms of this condition include:  Headaches. This is the most common symptom.  Brief episodes of total blindness.  Double vision, blurred vision, or poor side (peripheral) vision.  Pain in the shoulders or neck.  Nausea and vomiting.  A sound like rushing water or a pulsing sound within the ears (pulsatile tinnitus), or ringing in the ears. How is this diagnosed? This condition may be diagnosed based on:  Your symptoms and medical history.  Imaging tests of the brain, such as: ? CT scan. ? MRI. ? Magnetic resonance venogram (MRV) to check the veins.  Diagnostic lumbar puncture. This is a procedure to remove and examine a sample of cerebrospinal fluid. This procedure can determine whether too much fluid may be causing IIH.  A thorough eye exam to check for swelling or nerve damage in the eyes. How is this treated? Treatment for this condition depends on the symptoms. The goal of treatment is to decrease the pressure around your brain. Common treatments include:  Weight loss through healthy eating, salt  restriction, and exercise, if you are  overweight.  Medicines to decrease the production of spinal fluid and lower the pressure within your skull.  Medicines to prevent or treat headaches. Other treatments may include:  Surgery to place drains (shunts) in your brain for removing excess fluid.  Lumbar puncture to remove excess cerebrospinal fluid. Follow these instructions at home:  If you are overweight or obese, work with your health care provider to lose weight.  Take over-the-counter and prescription medicines only as told by your health care provider.  Ask your health care provider if the medicine prescribed to you requires you to avoid driving or using machinery.  Do not use any products that contain nicotine or tobacco, such as cigarettes, e-cigarettes, and chewing tobacco. If you need help quitting, ask your health care provider.  Keep all follow-up visits as told by your health care provider. This is important. Contact a health care provider if: You have changes in your vision, such as:  Double vision.  Blurred vision.  Poor peripheral vision. Get help right away if: You have any of the following symptoms and they get worse or do not get better:  Headaches.  Nausea.  Vomiting.  Sudden trouble seeing. Summary  Idiopathic intracranial hypertension (IIH) is a condition that increases pressure around the brain. The cause is not known (is idiopathic).  The most common symptom of IIH is headaches. Vision changes, pain in the shoulders or neck, nausea, and vomiting may also occur.  Treatment for this condition depends on your symptoms. The goal of treatment is to decrease the pressure around your brain.  If you are overweight or obese, work with your health care provider to lose weight.  Take over-the-counter and prescription medicines only as told by your health care provider. This information is not intended to replace advice given to you by your health care provider. Make sure you discuss any  questions you have with your health care provider. Document Revised: 12/09/2018 Document Reviewed: 12/09/2018 Elsevier Patient Education  2021 [employer] Inc.  Acetazolamide Oral Tablets What is this medicine? ACETAZOLAMIDE (a set a ZOLE a mide) is a diuretic. It helps you make more urine and to lose salt and excess water from your body. It treats swelling from heart disease. It helps treat some seizures and some kinds of glaucoma. It also treats and prevents symptoms of altitude sickness (acute mountain sickness). This medicine may be used for other purposes; ask your health care provider or pharmacist if you have questions. COMMON BRAND NAME(S): Diamox What should I tell my health care provider before I take this medicine? They need to know if you have any of these conditions:  glaucoma  kidney disease  liver disease  low adrenal gland function  lung or breathing disease (COPD, chronic bronchitis, emphysema)  an unusual or allergic reaction to acetazolamide, sulfa drugs, other drugs, foods, dyes or preservatives  pregnant or trying to get pregnant  breast-feeding How should I use this medicine? Take this drug by mouth. Take it as directed on the prescription label at the same time every day. You can take it with or without food. If it upsets your stomach, take it with food. Keep taking it unless your health care provider tells you to stop. Talk to your health care provider about the use of this drug in children. Special care may be needed. Overdosage: If you think you have taken too much of this medicine contact a poison control center or emergency room at once. NOTE:  This medicine is only for you. Do not share this medicine with others. What if I miss a dose? If you miss a dose, take it as soon as you can. If it is almost time for your next dose, take only that dose. Do not take double or extra doses. What may interact with this medicine? Do not take this medicine with any of the  following medications:  methazolamide This medicine may also interact with the following medications:  aspirin and aspirin-like medicines  cyclosporine  lithium  medicine for diabetes  methenamine  other diuretics  phenytoin  primidone  quinidine  sodium bicarbonate  stimulant medicines like dextroamphetamine This list may not describe all possible interactions. Give your health care provider a list of all the medicines, herbs, non-prescription drugs, or dietary supplements you use. Also tell them if you smoke, drink alcohol, or use illegal drugs. Some items may interact with your medicine. What should I watch for while using this medicine? Visit your health care provider for regular checks on your progress. Tell your health care provider if your symptoms do not start to get better or if they get worse. This drug may cause serious skin reactions. They can happen weeks to months after starting the drug. Contact your health care provider right away if you notice fevers or flu-like symptoms with a rash. The rash may be red or purple and then turn into blisters or peeling of the skin. Or, you might notice a red rash with swelling of the face, lips or lymph nodes in your neck or under your arms. You may get drowsy or dizzy. Do not drive, use machinery, or do anything that needs mental alertness until you know how this drug affects you. Do not stand up or sit up quickly, especially if you are an older patient. This reduces the risk of dizzy or fainting spells. What side effects may I notice from receiving this medicine? Side effects that you should report to your doctor or health care provider as soon as possible:  allergic reactions (skin rash, itching or hives; swelling of the face, lips, or tongue)  high acid levels (trouble breathing; fast, irregular heartbeat; headache; confusion; unusually weak or tired; nausea, vomiting)  infection (fever, chills, cough, sore throat, pain or  trouble passing urine)  liver injury (dark yellow or brown urine; general ill feeling or flu-like symptoms; loss of appetite, right upper belly pain; unusually weak or tired, yellowing of the eyes or skin)  low red blood cell counts (trouble breathing; feeling faint; lightheaded, falls; unusually weak or tired)  redness, blistering, peeling, or loosening of the skin, including inside the mouth  unusual bruising or bleeding Side effects that usually do not require medical attention (report to your doctor or health care provider if they continue or are bothersome):  decreased hearing, ringing of the ears  diarrhea  increased thirst  kidney stones (blood in the urine; pain when urinating; pain the lower back or side)  loss of appetite  feeling faint or lightheaded, falls; muscle cramps or pain)  nausea  pain, tingling, numbness in the hands or feet  unusual sweating  vomiting This list may not describe all possible side effects. Call your doctor for medical advice about side effects. You may report side effects to FDA at 1-800-FDA-1088. Where should I keep my medicine? Keep out of the reach of children and pets. Store at room temperature between 20 and 25 degrees C (68 and 77 degrees F). Throw away any unused  drug after the expiration date. NOTE: This sheet is a summary. It may not cover all possible information. If you have questions about this medicine, talk to your doctor, pharmacist, or health care provider.  2021 Elsevier/Gold Standard (2018-10-24 12:44:24)

## 2020-06-02 NOTE — Progress Notes (Signed)
Chief Complaint  Patient presents with  . Follow-up    Rm 2, alone, pt states she was never explained her mri and sleep study results. Pt states she still has muscle pain all over her body.      HISTORY OF PRESENT ILLNESS: 06/03/20 ALL:  Stephanie Davies is a 45 y.o. female here today for follow up for headaches, chronic pain and non restorative sleep. NCS was unremarkable. MRI did show concerns of optic nerve sheath enlargement with thinned pituitary gland and enlarged sella turcica. Spinal tap was recommended to evaluate for IIH. We have not heard back form her since.   She returns today with continued concerns of headaches, fatigue, and chronic generalized pain.   She has a history of IIH and was seen by Dr Sandria Manly. She reports having a LP resulting in a severe headache for days following. OP in 2013 was 34. She reports that she was never treated. She has never taken Diamox or Topamax. No notes for review.    HST 10/31/2019 showed mild OSA with AHI 13/hr, REM sleep dependent with REM AHI 24.5/hr. No hypoxemia. AutoPAP 5-12cmH20 with EPR of 2 with heated humidity was ordered by Dr Vickey Huger.   She reports having significant left sided abdominal pain for months. She underwent laparoscopic appendectomy for acute appendicitis on 03/05/20. Since, pain has significantly improved.     HISTORY (copied from Dr Dohmeier's previous note)  Stephanie Davies a 51 y.o. year old Other or two or more races female patientseen here as a referralon 10/16/2019 from Dr. Norlene Duel a sleep consultation.   Chiefconcernaccording to patient : Stephanie Davies is a 45 y.o. female patient of [place] descent, a married mother of a 81 year -old daughter , and seen here as a consultation requested by Dr. Drue Novel for an ongoing complaint of pain.  Stephanie Davies remembers that she had a concern of pins-and-needles sensations pain throughout her body including headaches and allodynia even before her daughter was conceived.   She had difficulties with conceiving the first time and has been trying since to no avail.  She remembers that during the pregnancy she could hardly exercise or walk and hips and lower pelvic area were described as very painful.  She has presented for several years now to different specialists, she remains very sensitive to touch, reports frequent sharp and stabbing pains and wakes up with headaches.  She spent 3 years in before returning to the [place].  There was no change of her pain situation in the different environment.  She had for a while lost 80 pounds only to gain the weight back but she did neither feel an improvement of her body pains nor her headaches but she achieved weight loss.  MRI of the brain and lumbar spine were completed and negative, she has not had a nerve conduction study EMG but is concerned whether she would be able to tolerate a needle test.  Dr. Drue Novel states that she is taking tizanidine and Excedrin Migraine almost daily for headaches and he questioned since she wakes up with headaches if the patient ever had a sleep study.  She did not.  She wakes up unrefreshed and restored but feels strongly that the pain is affecting her sleep quality as it does in daytime her life quality overall.  She  has a past medical history of Alopecia, Anal fistula, Anxiety and depression (03/07/2014), GERD (gastroesophageal reflux disease) (02/14/2014), Goiter (03/07/2014), Headache(784.0), Hemorrhoids, Kidney stone, and Pseudotumor cerebri.  Pseudotumor cerebri was diagnosed in 2004 by Dr. Sandria ManlyLove.  She was undergoing salpingoraphy in [place] and normal findings were reported.   Sleeprelevant medical history: Nocturia 2 times, frequent vivid dreams, being chased or followed- Familymedical /sleep history:mother has daily headaches and wakes up headaches, HTN, DM- no insomnia,no apnea.   Social history:Patient is a [employer] graduate and studying Biology-Genetics. She is  working on Environmental managergraduate degree- and  lives in a household with spouse, mother and daughter.   Tobacco use; none. ETOH use; none , Caffeine intake in form of Coffee( 1 cup ). Excedrin migraine has caffeine , takes it daily. Regular exercise - none    Sleep habits are as follows:The patient's dinner time is between 7-9 PM. The patient goes to bed at 10 PM and continues to sleep for 3-4 hours, wakes for 1-2 bathroom breaks,resumes sleep.    The preferred sleep position is on her sides , with the support of 2 pillows.memory foam. Has been treated for heart burn-  Dreams are reportedly very frequent/vivid. 6 AM is the usual rise time- daughter is now in first garde . The patient wakes up spontaneously by 5 AM .  She reports not feeling refreshed or restored in AM, with symptoms such as dry mouth, morning headaches - but never woken by HA, and residual fatigue. Naps are taken infrequently.     REVIEW OF SYSTEMS: Out of a complete 14 system review of symptoms, the patient complains only of the following symptoms, headaches, chronic fatigue, chronic pain, restless sleep, and all other reviewed systems are negative.    ALLERGIES: No Known Allergies   HOME MEDICATIONS: Outpatient Medications Prior to Visit  Medication Sig Dispense Refill  . acetaminophen (TYLENOL) 500 MG tablet You can take 2 tablets every 6 hours as needed for pain.  You can alternate with plain ibuprofen.  You can buy this over-the-counter at any drugstore.  Do not take more than 4000 mg of Tylenol/acetaminophen per day, it can harm your liver. (Patient taking differently: Take 1,000 mg by mouth every 6 (six) hours as needed for moderate pain. You can take 2 tablets every 6 hours as needed for pain.  You can alternate with plain ibuprofen.  You can buy this over-the-counter at any drugstore.  Do not take more than 4000 mg of Tylenol/acetaminophen per day, it can harm your liver.)    . aspirin-acetaminophen-caffeine (EXCEDRIN MIGRAINE) 250-250-65 MG tablet Take 2  tablets by mouth daily as needed for migraine.    . famotidine (PEPCID) 20 MG tablet Take 1 tablet (20 mg total) by mouth 2 (two) times daily. 30 tablet 0  . ibuprofen (ADVIL) 200 MG tablet You can take 2 to 3 tablets every 6 hours as needed for pain.  You can alternate with plain Tylenol/acetaminophen.  You can buy this over-the-counter at any drugstore without a prescription. (Patient taking differently: You can take 2 to 3 tablets every 6 hours as needed for pain.  You can alternate with plain Tylenol/acetaminophen.  You can buy this over-the-counter at any drugstore without a prescription.)    . ketoconazole (NIZORAL) 2 % cream Apply topically daily as needed for irritation. 30 g 0  . oxyCODONE (OXY IR/ROXICODONE) 5 MG immediate release tablet You can take 1 tablet every 6 hours for pain not relieved by plain Tylenol and ibuprofen. 15 tablet 0  . pantoprazole (PROTONIX) 40 MG tablet Take 40 mg by mouth every morning.    . sertraline (ZOLOFT) 100 MG tablet Take 1.5 tablets (  150 mg total) by mouth daily. 45 tablet 0   No facility-administered medications prior to visit.     PAST MEDICAL HISTORY: Past Medical History:  Diagnosis Date  . Alopecia   . Anal fistula   . Anxiety and depression 03/07/2014  . GERD (gastroesophageal reflux disease) 02/14/2014  . Goiter 03/07/2014  . Headache(784.0)   . Hemorrhoids   . Kidney stone   . Pseudotumor cerebri      PAST SURGICAL HISTORY: Past Surgical History:  Procedure Laterality Date  . CHOLECYSTECTOMY  2004  . kidney stent: 12/2018 [facility]    . LAPAROSCOPIC APPENDECTOMY N/A 03/05/2020   Procedure: APPENDECTOMY LAPAROSCOPIC;  Surgeon: Quentin Ore, MD;  Location: [facility];  Service: General;  Laterality: N/A;     FAMILY HISTORY: Family History  Problem Relation Age of Onset  . Diabetes Mother   . Hypertension Mother   . Esophageal cancer Maternal Grandmother   . Esophageal cancer Maternal Grandfather   . Diabetes Maternal  Grandfather   . Colon cancer Neg Hx   . Colon polyps Neg Hx   . Gallbladder disease Neg Hx   . Kidney disease Neg Hx      SOCIAL HISTORY: Social History   Socioeconomic History  . Marital status: Married    Spouse name: Not on file  . Number of children: 1  . Years of education: Not on file  . Highest education level: Bachelor's degree (e.g., BA, AB, BS)  Occupational History  . Occupation: stay home  Tobacco Use  . Smoking status: Never Smoker  . Smokeless tobacco: Never Used  Vaping Use  . Vaping Use: Never used  Substance and Sexual Activity  . Alcohol use: No  . Drug use: No  . Sexual activity: Not Currently    Birth control/protection: None  Other Topics Concern  . Not on file  Social History Narrative   Original from from [place]   Lives w/ mother, husband overseas ([place])   Student-- [employer]   G1P1, daughter 32   Social Determinants of Health   Financial Resource Strain: Not on file  Food Insecurity: Not on file  Transportation Needs: Not on file  Physical Activity: Not on file  Stress: Not on file  Social Connections: Not on file  Intimate Partner Violence: Not on file      PHYSICAL EXAM  Vitals:   06/03/20 0858  BP: 115/76  Pulse: 80  Weight: 282 lb (127.9 kg)  Height: 5\' 6"  (1.676 m)   Body mass index is 45.52 kg/m.   Generalized: Well developed, in no acute distress  Cardiology: normal rate and rhythm, no murmur auscultated  Respiratory: clear to auscultation bilaterally    Neurological examination  Mentation: Alert oriented to time, place, history taking. Follows all commands speech and language fluent Cranial nerve II-XII: Pupils were equal round reactive to light. Extraocular movements were full, visual field were full on confrontational test. Facial sensation and strength were normal. Head turning and shoulder shrug  were normal and symmetric. Motor: The motor testing reveals 5 over 5 strength of all 4 extremities. Good  symmetric motor tone is noted throughout.  Sensory: Sensory testing is intact to soft touch on all 4 extremities. No evidence of extinction is noted.  Coordination: Cerebellar testing reveals good finger-nose-finger and heel-to-shin bilaterally.  Gait and station: Gait is normal.     DIAGNOSTIC DATA (LABS, IMAGING, TESTING) - I reviewed patient records, labs, notes, testing and imaging myself where available.  Lab  Results  Component Value Date   WBC 8.6 03/23/2020   HGB 13.5 03/23/2020   HCT 41.9 03/23/2020   MCV 81.4 03/23/2020   PLT 443 (H) 03/23/2020      Component Value Date/Time   NA 138 03/23/2020 1714   K 3.8 03/23/2020 1714   CL 105 03/23/2020 1714   CO2 23 03/23/2020 1714   GLUCOSE 72 03/23/2020 1714   BUN 8 03/23/2020 1714   CREATININE 0.86 03/23/2020 1714   CREATININE 0.52 08/16/2013 0842   CALCIUM 9.5 03/23/2020 1714   PROT 7.7 03/23/2020 1714   ALBUMIN 3.8 03/23/2020 1714   AST 24 03/23/2020 1714   ALT 26 03/23/2020 1714   ALKPHOS 105 03/23/2020 1714   BILITOT 0.6 03/23/2020 1714   GFRNONAA >60 03/23/2020 1714   GFRAA >90 01/09/2014 1906   No results found for: CHOL, HDL, LDLCALC, LDLDIRECT, TRIG, CHOLHDL Lab Results  Component Value Date   HGBA1C 5.6 07/19/2016   Lab Results  Component Value Date   VITAMINB12 219 07/19/2016   Lab Results  Component Value Date   TSH 1.19 07/23/2019    No flowsheet data found.   No flowsheet data found.   ASSESSMENT AND PLAN  45 y.o. year old female  has a past medical history of Alopecia, Anal fistula, Anxiety and depression (03/07/2014), GERD (gastroesophageal reflux disease) (02/14/2014), Goiter (03/07/2014), Headache(784.0), Hemorrhoids, Kidney stone, and Pseudotumor cerebri. here with     OSA (obstructive sleep apnea) - Plan: For home use only DME continuous positive airway pressure (CPAP)  Familial enlargement of the sella turcica - Plan: Ambulatory referral to Ophthalmology  Chronic fatigue  Sleep  related headaches  RLS (restless legs syndrome)  Other chronic pain  History of idiopathic intracranial hypertension - Plan: Ambulatory referral to Ophthalmology   Stephanie Davies returns, today, with continued concerns of headaches, restless sleep and chronic pain. I have reviewed MRI results as well as sleep study results with her in the office. I have recommended she have an LP to confirm suspicion of IIH, however, she is very hesitant as she had a severe post procedural headache with last LP. She has reportedly never been treated with medication therapy for IIH. I will attempt to obtain previous notes from Dr Sandria Manly for evaluation. May consider trail of Diamox pending conversation with Dr Vickey Huger.  Last OP in 2013 was 34. She has not had a recent eye exam. I have advised that she schedule an appointment with ophthalmology asap. I have also placed a referral. I will place orders for AutoPAP as previously ordered by Dr Vickey Huger. She was instructed to listen for a call from DME. She was advised to call me if she has not heard back in 1 week. We have discussed concerns of chronic pain. She will continue follow up with PCP for pain management or referral to specialist as needed. Healthy lifestyle habits advised. Weight management discussed. I will have her return for follow up 6-8 weeks following AutoPAP set up. She verbalizes understanding and agreement with this plan.     Orders Placed This Encounter  Procedures  . For home use only DME continuous positive airway pressure (CPAP)    AutoPAP 5-12cmH20 with EPR of 2 with heated humidity    Order Specific Question:   Length of Need    Answer:   Lifetime    Order Specific Question:   Patient has OSA or probable OSA    Answer:   Yes    Order Specific Question:  Is the patient currently using CPAP in the home    Answer:   Yes    Order Specific Question:   Settings    Answer:   Other see comments    Order Specific Question:   CPAP supplies needed    Answer:    Mask, headgear, cushions, filters, heated tubing and water chamber  . Ambulatory referral to Ophthalmology    Referral Priority:   Routine    Referral Type:   Consultation    Referral Reason:   Specialty Services Required    Requested Specialty:   Ophthalmology    Number of Visits Requested:   1     No orders of the defined types were placed in this encounter.     I spent 35 minutes of face-to-face and non-face-to-face time with patient.  This included previsit chart review, lab review, study review, order entry, electronic health record documentation, patient education.    Shawnie Dapper, MSN, FNP-C 06/03/2020, 10:59 AM  [facility] [address], [address] [place], [place] [address] [phone]

## 2020-06-03 ENCOUNTER — Encounter: Payer: Self-pay | Admitting: Family Medicine

## 2020-06-03 ENCOUNTER — Ambulatory Visit: Payer: Medicaid Other | Admitting: Family Medicine

## 2020-06-03 ENCOUNTER — Other Ambulatory Visit: Payer: Self-pay

## 2020-06-03 VITALS — BP 115/76 | HR 80 | Ht 66.0 in | Wt 282.0 lb

## 2020-06-03 DIAGNOSIS — R930 Abnormal findings on diagnostic imaging of skull and head, not elsewhere classified: Secondary | ICD-10-CM

## 2020-06-03 DIAGNOSIS — R5382 Chronic fatigue, unspecified: Secondary | ICD-10-CM

## 2020-06-03 DIAGNOSIS — G2581 Restless legs syndrome: Secondary | ICD-10-CM

## 2020-06-03 DIAGNOSIS — G4733 Obstructive sleep apnea (adult) (pediatric): Secondary | ICD-10-CM

## 2020-06-03 DIAGNOSIS — G8929 Other chronic pain: Secondary | ICD-10-CM

## 2020-06-03 DIAGNOSIS — R519 Headache, unspecified: Secondary | ICD-10-CM | POA: Diagnosis not present

## 2020-06-03 DIAGNOSIS — Z8669 Personal history of other diseases of the nervous system and sense organs: Secondary | ICD-10-CM

## 2020-06-05 ENCOUNTER — Encounter: Payer: Self-pay | Admitting: Internal Medicine

## 2020-06-05 ENCOUNTER — Other Ambulatory Visit: Payer: Self-pay

## 2020-06-05 ENCOUNTER — Ambulatory Visit (INDEPENDENT_AMBULATORY_CARE_PROVIDER_SITE_OTHER): Payer: Medicaid Other | Admitting: Internal Medicine

## 2020-06-05 VITALS — BP 122/80 | HR 96 | Temp 98.2°F | Ht 66.0 in | Wt 279.0 lb

## 2020-06-05 DIAGNOSIS — E049 Nontoxic goiter, unspecified: Secondary | ICD-10-CM | POA: Diagnosis not present

## 2020-06-05 DIAGNOSIS — E010 Iodine-deficiency related diffuse (endemic) goiter: Secondary | ICD-10-CM

## 2020-06-05 DIAGNOSIS — G4733 Obstructive sleep apnea (adult) (pediatric): Secondary | ICD-10-CM

## 2020-06-05 DIAGNOSIS — M797 Fibromyalgia: Secondary | ICD-10-CM | POA: Diagnosis not present

## 2020-06-05 DIAGNOSIS — R700 Elevated erythrocyte sedimentation rate: Secondary | ICD-10-CM

## 2020-06-05 DIAGNOSIS — M791 Myalgia, unspecified site: Secondary | ICD-10-CM | POA: Diagnosis not present

## 2020-06-05 MED ORDER — AMITRIPTYLINE HCL 25 MG PO TABS: 25.0000 mg | ORAL_TABLET | Freq: Every day | ORAL | 1 refills | Status: DC

## 2020-06-05 NOTE — Progress Notes (Signed)
Subjective:    Patient ID: Stephanie Davies, female    DOB: 03-Feb-1975, 45 y.o.   MRN: [id number]  DOS:  06/05/2020 Type of visit - description:   Last office visit 11-2019 Since then, after several trips to the hospital, she eventually had an Appendectomy 03/05/2020. She continue with multiple concerns:  Continue w/ chronic left-sided abdominal, back pain.  Generalized myalgias and arthralgias. States her muscles are tender to the lightest touch.  Denies fever chills.  No weight loss.  No rash.  She has not noted any puffiness or swelling of her hands.  Continue with headaches.  Review of Systems See above   Past Medical History:  Diagnosis Date  . Alopecia   . Anal fistula   . Anxiety and depression 03/07/2014  . GERD (gastroesophageal reflux disease) 02/14/2014  . Goiter 03/07/2014  . Headache(784.0)   . Hemorrhoids   . Kidney stone   . Pseudotumor cerebri     Past Surgical History:  Procedure Laterality Date  . CHOLECYSTECTOMY  2004  . kidney stent: 12/2018 at [facility]    . LAPAROSCOPIC APPENDECTOMY N/A 03/05/2020   Procedure: APPENDECTOMY LAPAROSCOPIC;  Surgeon: Quentin Ore, MD;  Location: [facility];  Service: General;  Laterality: N/A;    Allergies as of 06/05/2020   No Known Allergies     Medication List       Accurate as of Jun 05, 2020  9:17 PM. If you have any questions, ask your nurse or doctor.        STOP taking these medications   famotidine 20 MG tablet Commonly known as: PEPCID Stopped by: Willow Ora, MD   ibuprofen 200 MG tablet Commonly known as: ADVIL Stopped by: Willow Ora, MD   oxyCODONE 5 MG immediate release tablet Commonly known as: Oxy IR/ROXICODONE Stopped by: Willow Ora, MD     TAKE these medications   acetaminophen 500 MG tablet Commonly known as: TYLENOL You can take 2 tablets every 6 hours as needed for pain.  You can alternate with plain ibuprofen.  You can buy this over-the-counter at any drugstore.  Do not take more than  4000 mg of Tylenol/acetaminophen per day, it can harm your liver. What changed:   how much to take  how to take this  when to take this  reasons to take this   amitriptyline 25 MG tablet Commonly known as: ELAVIL Take 1 tablet (25 mg total) by mouth at bedtime. Started by: Willow Ora, MD   aspirin-acetaminophen-caffeine [id number] MG tablet Commonly known as: EXCEDRIN MIGRAINE Take 2 tablets by mouth daily as needed for migraine.   ketoconazole 2 % cream Commonly known as: NIZORAL Apply topically daily as needed for irritation.   pantoprazole 40 MG tablet Commonly known as: PROTONIX Take 40 mg by mouth every morning.   sertraline 100 MG tablet Commonly known as: ZOLOFT Take 1.5 tablets (150 mg total) by mouth daily.          Objective:   Physical Exam BP 122/80 (BP Location: Right Arm, Patient Position: Sitting, Cuff Size: Large)   Pulse 96   Temp 98.2 F (36.8 C) (Oral)   Ht 5\' 6"  (1.676 m)   Wt 279 lb (126.6 kg)   SpO2 96%   BMI 45.03 kg/m  General:   Well developed, NAD, BMI noted. HEENT:  Normocephalic . Face symmetric, atraumatic Neck: Obvious thyromegaly, bilateral, slightly nodular, nontender Lungs:  CTA B Normal respiratory effort, no intercostal retractions, no accessory muscle use. Heart:  RRR,  no murmur.  Lower extremities: no pretibial edema bilaterally MSK: Hands and wrists with no synovitis Skin: Not pale. Not jaundice Neurologic:  alert & oriented X3.  Speech normal, gait appropriate for age and unassisted Psych--  Cognition and judgment appear intact.  Cooperative with normal attention span and concentration.  Behavior appropriate. No anxious or depressed appearing.      Assessment     ASSESSMENT Anxiety, depression pseudotumor cerebri, headaches Goiter : [place] 03-2014, bx  Done 11-2015  IBS Kidney stones, kidney stent R 12/2018 renal cysts found on a renal ultrasound in [place] Goiter: Biopsy 08/23/2019, [facility] category 2  benign Appendicitis 10-2019, nonsurgical approach. OSA: Rx CPAP Headaches: See neurology note 06/03/2020   PLAN: Status post appendectomy Left-sided abdominal pain: Chronic issue, multiple CTs, rec observation for now. Headaches: Last seen by neurology 06/03/2020, they recommended LP, also they recommended eye exam ASAP. Myalgias, arthralgias: Generalized myalgia some arthralgias in the context of anxiety, depression, headaches, chronic abdominal pain.  This is probably fibromyalgia (neurology told her about FM  as well few days ago according to the patient). Will get labs including a CBC, CK, HIV, RPR, sed rate, vitamin D. Trial with amitriptyline, warning symptoms of serotonin syndrome discussed. Reassess in 2 months. OSA: Rx for CPAP by neurology, still waiting for the equipment  Thyroid nodule: Per ultrasound 07-2019, I recommended at the time a FNA but that has not happened.  On exam today, thyroid seems larger than before. Plan: TFTs, [place],  refer to endocrinology. H/o  R liver lesion, L kidney cyst (seen on imaging in [place]): Since then, had multiple CTs of the abdomen: -she had multiple small liver hypodensities , likely cysts. -L 4 mm nonobstructing  renal calculi. -Stable 1.6 cm left renal cyst. -R chronic renal cortical scaring - stable borderline splenomagaly I do not believe further imaging is needed. RTC 2 months     Time is spent: 45 minutes. We discussed a number of issues New diagnosis of possible FM  Has thyroid nodules, did not proceed with a FNA as recommended. Multiple CTs to reviewed.  This visit occurred during the SARS-CoV-2 public health emergency.  Safety protocols were in place, including screening questions prior to the visit, additional usage of staff PPE, and extensive cleaning of exam room while observing appropriate contact time as indicated for disinfecting solutions.

## 2020-06-05 NOTE — Patient Instructions (Addendum)
Start taking amitriptyline 25 mg: 1 tablet at bedtime.  Watch for excessive somnolence.  It may interact with other medications, if you have fever, chills, diarrhea, difficulty with your vision, shakiness: stol amitriptyline immediately and let me know  We are ordering another thyroid ultrasound  We are referring you to Dr. Lonzo Cloud, the endocrinologist in our office   GO TO THE LAB : Get the blood work     GO TO THE FRONT DESK, PLEASE SCHEDULE YOUR APPOINTMENTS Come back for a checkup in 2 months

## 2020-06-05 NOTE — Assessment & Plan Note (Signed)
Status post appendectomy Left-sided abdominal pain: Chronic issue, multiple CTs, rec observation for now. Headaches: Last seen by neurology 06/03/2020, they recommended LP, also they recommended eye exam ASAP. Myalgias, arthralgias: Generalized myalgia some arthralgias in the context of anxiety, depression, headaches, chronic abdominal pain.  This is probably fibromyalgia (neurology told her about FM  as well few days ago according to the patient). Will get labs including a CBC, CK, HIV, RPR, sed rate, vitamin D. Trial with amitriptyline, warning symptoms of serotonin syndrome discussed. Reassess in 2 months. OSA: Rx for CPAP by neurology, still waiting for the equipment  Thyroid nodule: Per ultrasound 07-2019, I recommended at the time a FNA but that has not happened.  On exam today, thyroid seems larger than before. Plan: TFTs, [place],  refer to endocrinology. H/o  R liver lesion, L kidney cyst (seen on imaging in [place]): Since then, had multiple CTs of the abdomen: -she had multiple small liver hypodensities , likely cysts. -L 4 mm nonobstructing  renal calculi. -Stable 1.6 cm left renal cyst. -R chronic renal cortical scaring - stable borderline splenomagaly I do not believe further imaging is needed. RTC 2 months

## 2020-06-06 ENCOUNTER — Telehealth: Payer: Self-pay | Admitting: Internal Medicine

## 2020-06-06 LAB — CBC WITH DIFFERENTIAL/PLATELET
Basophils Absolute: 0.1 10*3/uL (ref 0.0–0.1)
Basophils Relative: 0.9 % (ref 0.0–3.0)
Eosinophils Absolute: 0.1 10*3/uL (ref 0.0–0.7)
Eosinophils Relative: 1.1 % (ref 0.0–5.0)
HCT: 40.1 % (ref 36.0–46.0)
Hemoglobin: 13.1 g/dL (ref 12.0–15.0)
Lymphocytes Relative: 12.7 % (ref 12.0–46.0)
Lymphs Abs: 1.6 10*3/uL (ref 0.7–4.0)
MCHC: 32.6 g/dL (ref 30.0–36.0)
MCV: 77.6 fl — ABNORMAL LOW (ref 78.0–100.0)
Monocytes Absolute: 0.5 10*3/uL (ref 0.1–1.0)
Monocytes Relative: 3.8 % (ref 3.0–12.0)
Neutro Abs: 10.5 10*3/uL — ABNORMAL HIGH (ref 1.4–7.7)
Neutrophils Relative %: 81.5 % — ABNORMAL HIGH (ref 43.0–77.0)
Platelets: 401 10*3/uL — ABNORMAL HIGH (ref 150.0–400.0)
RBC: 5.17 Mil/uL — ABNORMAL HIGH (ref 3.87–5.11)
RDW: 16.5 % — ABNORMAL HIGH (ref 11.5–15.5)
WBC: 12.9 10*3/uL — ABNORMAL HIGH (ref 4.0–10.5)

## 2020-06-06 LAB — TSH: TSH: 0.87 u[IU]/mL (ref 0.35–4.50)

## 2020-06-06 LAB — VITAMIN D 25 HYDROXY (VIT D DEFICIENCY, FRACTURES): VITD: 17.87 ng/mL — ABNORMAL LOW (ref 30.00–100.00)

## 2020-06-06 LAB — CK: Total CK: 67 U/L (ref 7–177)

## 2020-06-06 LAB — SEDIMENTATION RATE: Sed Rate: 58 mm/hr — ABNORMAL HIGH (ref 0–20)

## 2020-06-06 LAB — HIV ANTIBODY (ROUTINE TESTING W REFLEX): HIV 1&2 Ab, 4th Generation: NONREACTIVE

## 2020-06-06 LAB — RPR: RPR Ser Ql: NONREACTIVE

## 2020-06-06 LAB — T3, FREE: T3, Free: 3 pg/mL (ref 2.3–4.2)

## 2020-06-06 LAB — T4, FREE: Free T4: 0.62 ng/dL (ref 0.60–1.60)

## 2020-06-06 NOTE — Telephone Encounter (Signed)
Patient states the medication prescribe yesterday (amitriptyline ) it made her real tired this morning. Please advise

## 2020-06-06 NOTE — Telephone Encounter (Signed)
I have called pt and relayed the message above and she stated understanding.

## 2020-06-06 NOTE — Telephone Encounter (Signed)
Called pt back and left VM to return call to office. -JMA

## 2020-06-06 NOTE — Telephone Encounter (Signed)
That is okay, stop amitriptyline, send a prescription for tizanidine 4 mg 1 p.o. nightly #30 and 3 refills

## 2020-06-06 NOTE — Telephone Encounter (Signed)
Change to amitriptyline 10 mg 1 p.o. nightly send a new prescription #30 no refills

## 2020-06-06 NOTE — Telephone Encounter (Signed)
I spoken to the pt and she stated that she wanted to change the medication to Sirdalud (Tizanidine Common brands: Comfort Pac-tizanidine, Zanaflex). She stated that is a med she has taken in the past that did not make her feel so fatigued.   Pt is asking if Dr. Drue Novel is willing to send in this medication instead of the Amitriptyline.

## 2020-06-10 ENCOUNTER — Other Ambulatory Visit: Payer: Self-pay | Admitting: Family Medicine

## 2020-06-10 MED ORDER — VITAMIN D (ERGOCALCIFEROL) 1.25 MG (50000 UNIT) PO CAPS: 50000.0000 [IU] | ORAL_CAPSULE | ORAL | 0 refills | Status: DC

## 2020-06-10 NOTE — Telephone Encounter (Signed)
Dr Dohmeier does recommend repeating LP to have updated pressure and be able to confirm suspicion of IIH. We could do blood patch immediately following LP to help prevent headache if she is willing.

## 2020-06-10 NOTE — Addendum Note (Signed)
Addended byConrad Dante D on: 06/10/2020 08:05 AM   Modules accepted: Orders

## 2020-06-10 NOTE — Telephone Encounter (Signed)
Called patient.  Left message on her voicemail to return call.

## 2020-06-10 NOTE — Telephone Encounter (Signed)
Pt returned phone call. Would like a call back. 

## 2020-06-10 NOTE — Telephone Encounter (Signed)
Please let her know I have discussed her case with Dr Vickey Huger. We can start a trial of Diamox 500mg  in the morning to see if this helps with headaches. If willing, I will call this in for her to her local pharmacy. Most common side effects are increased urination, tingling in fingers.

## 2020-06-11 ENCOUNTER — Telehealth: Payer: Self-pay

## 2020-06-11 MED ORDER — ACETAZOLAMIDE 250 MG PO TABS: 500.0000 mg | ORAL_TABLET | Freq: Every day | ORAL | 3 refills | Status: DC

## 2020-06-11 NOTE — Telephone Encounter (Signed)
Referral sent to [facility]

## 2020-06-11 NOTE — Addendum Note (Signed)
Addended by: Guy Begin on: 06/11/2020 10:02 AM   Modules accepted: Orders

## 2020-06-11 NOTE — Telephone Encounter (Addendum)
I called patient.  I relayed to her that prescription for acetazolamide 250 mg tablets called into [facility] she will take 2 tablets daily.  I redid the prescription as the initial rx and only had 30 tablets.  Patient verbalized understanding and appreciation.

## 2020-06-11 NOTE — Addendum Note (Signed)
Addended by: Guy Begin on: 06/11/2020 02:29 PM   Modules accepted: Orders

## 2020-06-11 NOTE — Telephone Encounter (Signed)
I called patient.  I gave her name and Dr. Oliva Bustard recommendations start starting Diamox 500 mg and she is okay to do this. The LP she is a little wary since had bad experience with positional headaches lasting for a week.  She did have one back in 2013 which did show elevated pressure along with being consistent for [facility].  She preferred not to have that done again. Sshe will be out this date from June 8 to August 14.  I told her I would talk with Amy and then get back to work.  [facility] [address].  She noted daily headaches, but severe body aches.

## 2020-06-12 ENCOUNTER — Other Ambulatory Visit: Payer: Self-pay

## 2020-06-12 ENCOUNTER — Ambulatory Visit (HOSPITAL_BASED_OUTPATIENT_CLINIC_OR_DEPARTMENT_OTHER)
Admission: RE | Admit: 2020-06-12 | Discharge: 2020-06-12 | Disposition: A | Discharge status: Home / Self Care | Payer: Medicaid Other | Source: Ambulatory Visit | Attending: Internal Medicine | Admitting: Internal Medicine

## 2020-06-12 DIAGNOSIS — E010 Iodine-deficiency related diffuse (endemic) goiter: Secondary | ICD-10-CM | POA: Diagnosis not present

## 2020-06-12 DIAGNOSIS — E049 Nontoxic goiter, unspecified: Secondary | ICD-10-CM | POA: Diagnosis not present

## 2020-06-12 NOTE — Telephone Encounter (Signed)
Patient came in and stated pharmacy does not have the order for meds

## 2020-06-19 ENCOUNTER — Telehealth: Payer: Self-pay | Admitting: Internal Medicine

## 2020-06-19 ENCOUNTER — Encounter: Payer: Self-pay | Admitting: Internal Medicine

## 2020-06-19 MED ORDER — SERTRALINE HCL 100 MG PO TABS: 150.0000 mg | ORAL_TABLET | Freq: Every day | ORAL | 1 refills | Status: DC

## 2020-06-19 MED ORDER — PANTOPRAZOLE SODIUM 40 MG PO TBEC: 40.0000 mg | DELAYED_RELEASE_TABLET | Freq: Every morning | ORAL | 1 refills | Status: DC

## 2020-06-19 NOTE — Telephone Encounter (Signed)
Patient would like a 3 month supply. She will be going out of town.   Medication:  pantoprazole (PROTONIX) 40 MG tablet [[id number]]    sertraline (ZOLOFT) 100 MG tablet [[id number]]   Has the patient contacted their pharmacy? No. (If no, request that the patient contact the pharmacy for the refill.) (If yes, when and what did the pharmacy advise?)     Preferred Pharmacy (with phone number or street name): [facility] [address] [place] - [address]  [address] [place] [address]  Phone:  [phone]  Fax:  [phone]      Agent: Please be advised that RX refills may take up to 3 business days. We ask that you follow-up with your pharmacy.

## 2020-06-19 NOTE — Telephone Encounter (Signed)
Rxs sent

## 2020-06-27 ENCOUNTER — Encounter: Payer: Self-pay | Admitting: Endocrinology

## 2020-08-29 ENCOUNTER — Ambulatory Visit: Payer: Medicaid Other | Admitting: Gastroenterology

## 2020-09-08 ENCOUNTER — Ambulatory Visit: Payer: Medicaid Other | Admitting: Internal Medicine

## 2020-09-09 ENCOUNTER — Ambulatory Visit: Payer: Medicaid Other | Admitting: Internal Medicine

## 2020-09-15 ENCOUNTER — Emergency Department (HOSPITAL_COMMUNITY)
Admission: EM | Admit: 2020-09-15 | Discharge: 2020-09-15 | Disposition: A | Discharge status: Home / Self Care | Payer: Medicaid Other | Attending: Emergency Medicine | Admitting: Emergency Medicine

## 2020-09-15 ENCOUNTER — Encounter (HOSPITAL_COMMUNITY): Payer: Self-pay

## 2020-09-15 ENCOUNTER — Emergency Department (HOSPITAL_COMMUNITY): Payer: Medicaid Other

## 2020-09-15 ENCOUNTER — Other Ambulatory Visit: Payer: Self-pay

## 2020-09-15 DIAGNOSIS — R0602 Shortness of breath: Secondary | ICD-10-CM | POA: Insufficient documentation

## 2020-09-15 DIAGNOSIS — R102 Pelvic and perineal pain: Secondary | ICD-10-CM | POA: Diagnosis not present

## 2020-09-15 DIAGNOSIS — D72829 Elevated white blood cell count, unspecified: Secondary | ICD-10-CM | POA: Insufficient documentation

## 2020-09-15 DIAGNOSIS — Z20822 Contact with and (suspected) exposure to covid-19: Secondary | ICD-10-CM | POA: Insufficient documentation

## 2020-09-15 DIAGNOSIS — Z8616 Personal history of COVID-19: Secondary | ICD-10-CM | POA: Insufficient documentation

## 2020-09-15 DIAGNOSIS — R059 Cough, unspecified: Secondary | ICD-10-CM | POA: Diagnosis not present

## 2020-09-15 DIAGNOSIS — R042 Hemoptysis: Secondary | ICD-10-CM

## 2020-09-15 HISTORY — DX: COVID-19: U07.1

## 2020-09-15 LAB — I-STAT BETA HCG BLOOD, ED (MC, WL, AP ONLY): I-stat hCG, quantitative: <5 m[IU]/mL (ref <5)

## 2020-09-15 LAB — CBC WITH DIFFERENTIAL/PLATELET
Abs Immature Granulocytes: 0.08 10*3/uL — ABNORMAL HIGH (ref 0.00–0.07)
Basophils Absolute: 0.1 10*3/uL (ref 0.0–0.1)
Basophils Relative: 0 %
Eosinophils Absolute: 0.6 10*3/uL — ABNORMAL HIGH (ref 0.0–0.5)
Eosinophils Relative: 4 %
HCT: 44.3 % (ref 36.0–46.0)
Hemoglobin: 13.9 g/dL (ref 12.0–15.0)
Immature Granulocytes: 1 %
Lymphocytes Relative: 10 %
Lymphs Abs: 1.5 10*3/uL (ref 0.7–4.0)
MCH: 25.2 pg — ABNORMAL LOW (ref 26.0–34.0)
MCHC: 31.4 g/dL (ref 30.0–36.0)
MCV: 80.4 fL (ref 80.0–100.0)
Monocytes Absolute: 0.7 10*3/uL (ref 0.1–1.0)
Monocytes Relative: 5 %
Neutro Abs: 12.3 10*3/uL — ABNORMAL HIGH (ref 1.7–7.7)
Neutrophils Relative %: 80 %
Platelets: 427 10*3/uL — ABNORMAL HIGH (ref 150–400)
RBC: 5.51 MIL/uL — ABNORMAL HIGH (ref 3.87–5.11)
RDW: 16.9 % — ABNORMAL HIGH (ref 11.5–15.5)
WBC: 15.1 10*3/uL — ABNORMAL HIGH (ref 4.0–10.5)
nRBC: 0 % (ref 0.0–0.2)

## 2020-09-15 LAB — D-DIMER, QUANTITATIVE: D-Dimer, Quant: 0.42 ug/mL-FEU (ref 0.00–0.50)

## 2020-09-15 LAB — PROTIME-INR
INR: 1 (ref 0.8–1.2)
Prothrombin Time: 13.2 seconds (ref 11.4–15.2)

## 2020-09-15 LAB — BASIC METABOLIC PANEL
Anion gap: 8 (ref 5–15)
BUN: 8 mg/dL (ref 6–20)
CO2: 21 mmol/L — ABNORMAL LOW (ref 22–32)
Calcium: 9.3 mg/dL (ref 8.9–10.3)
Chloride: 108 mmol/L (ref 98–111)
Creatinine, Ser: 0.75 mg/dL (ref 0.44–1.00)
GFR, Estimated: >60 mL/min (ref >60)
Glucose, Bld: 107 mg/dL — ABNORMAL HIGH (ref 70–99)
Potassium: 3.6 mmol/L (ref 3.5–5.1)
Sodium: 137 mmol/L (ref 135–145)

## 2020-09-15 LAB — RESP PANEL BY RT-PCR (FLU A&B, COVID) ARPGX2
Influenza A by PCR: NEGATIVE
Influenza B by PCR: NEGATIVE
SARS Coronavirus 2 by RT PCR: NEGATIVE

## 2020-09-15 LAB — TROPONIN I (HIGH SENSITIVITY): Troponin I (High Sensitivity): 3 ng/L (ref <18)

## 2020-09-15 MED ORDER — BENZONATATE 100 MG PO CAPS
100.0000 mg | ORAL_CAPSULE | Freq: Three times a day (TID) | ORAL | 0 refills | Status: DC
Start: 2020-09-15 — End: 2020-10-07

## 2020-09-15 MED ORDER — FLUTICASONE PROPIONATE 50 MCG/ACT NA SUSP: 2.0000 | Freq: Every day | NASAL | 0 refills | Status: DC

## 2020-09-15 MED ORDER — PROMETHAZINE-DM 6.25-15 MG/5ML PO SYRP: 5.0000 mL | ORAL_SOLUTION | Freq: Four times a day (QID) | ORAL | 0 refills | Status: DC | PRN

## 2020-09-15 MED ORDER — DOXYCYCLINE HYCLATE 100 MG PO CAPS: 100.0000 mg | ORAL_CAPSULE | Freq: Two times a day (BID) | ORAL | 0 refills | Status: AC

## 2020-09-15 MED ORDER — KETOROLAC TROMETHAMINE 30 MG/ML IJ SOLN
30.0000 mg | Freq: Once | INTRAMUSCULAR | Status: AC
  Administered 2020-09-15: 30 mg via INTRAVENOUS
  Filled 2020-09-15: qty 1

## 2020-09-15 MED ORDER — LACTATED RINGERS IV BOLUS
1000.0000 mL | Freq: Once | INTRAVENOUS | Status: AC
  Administered 2020-09-15: 1000 mL via INTRAVENOUS

## 2020-09-15 MED ORDER — ACETAMINOPHEN 500 MG PO TABS
1000.0000 mg | ORAL_TABLET | Freq: Once | ORAL | Status: AC
  Administered 2020-09-15: 1000 mg via ORAL
  Filled 2020-09-15: qty 2

## 2020-09-15 MED ORDER — BENZONATATE 100 MG PO CAPS
200.0000 mg | ORAL_CAPSULE | Freq: Once | ORAL | Status: AC
  Administered 2020-09-15: 200 mg via ORAL
  Filled 2020-09-15: qty 2

## 2020-09-15 NOTE — ED Triage Notes (Signed)
Patient c/o SOB and a cough x 3 days and today she had small amounts of bright red blood in the sputum and increased SOB.

## 2020-09-15 NOTE — ED Provider Notes (Signed)
[facility]-EMERGENCY DEPT Provider Note   CSN: [id number] Arrival date & time: 09/15/20  1507     History Chief Complaint  Patient presents with   Hemoptysis   Shortness of Breath    Carolan Leonidas RombergM Slemmer is a 45 y.o. female.  HPI Vanecia Leonidas RombergM Pedregon is a 45 y.o. female with past medical history significant for anxiety depression, COVID, goiter, obesity  Patient is presenting with shortness of breath and cough x 3 days. Cough has been constant and progressively worse for with initially yellow sputum. Today she noticed bright red blood in her sputum which she described as a couple drops and had worsening shortness of breath with chest pain when coughing.  Sternal or pleuritic chest pain.  She notes associated headache sweating with chills but did not take her temperature. She denies recent travel or recent hospitalizations. She denies history of tobacco use and has no known sick contacts. She has received three COVID vaccinations.   No recent surgeries, hospitalization, long travel, hemoptysis, estrogen containing OCP, cancer history.  No unilateral leg swelling.  No history of PE or VTE.   She denies dizziness, lightheadedness, sore throat, nausea, vomiting, abdominal pain, diarrhea, extremity swelling.       Past Medical History:  Diagnosis Date   Alopecia    Anal fistula    Anxiety and depression 03/07/2014   COVID    GERD (gastroesophageal reflux disease) 02/14/2014   Goiter 03/07/2014   Headache(784.0)    Hemorrhoids    Kidney stone    Pseudotumor cerebri     Patient Active Problem List   Diagnosis Date Noted   Acute appendicitis 03/05/2020   Appendicitis, acute 01/11/2020   OSA (obstructive sleep apnea) 12/01/2019   Obesity, Class III, BMI 40-49.9 (morbid obesity) (HCC) 11/29/2019   Annual physical exam 11/29/2019   Abdominal pain 11/03/2019   IBS (irritable bowel syndrome) 07/23/2019   History of kidney stones 07/23/2019   Pseudotumor cerebri     Anxiety 05/13/2016   Cough 04/11/2015   PCP NOTES >>>>>>>>>>>>>>>>>>>>>>>>>>>>>>>>>> 09/20/2014   Anxiety and depression 03/07/2014   Goiter 03/07/2014   Hives 02/20/2014   GERD (gastroesophageal reflux disease) 02/14/2014   Upper airway cough syndrome 01/22/2014   Eczema, allergic 01/09/2014   Dyspnea 01/09/2014   Hip pain 10/15/2013   Pain in joint, pelvic region and thigh 10/08/2013   External bleeding hemorrhoids 10/08/2013   Rh negative status during pregnancy 06/06/2013    Past Surgical History:  Procedure Laterality Date   CHOLECYSTECTOMY  2004   kidney stent: 12/2018 at [facility]     LAPAROSCOPIC APPENDECTOMY N/A 03/05/2020   Procedure: APPENDECTOMY LAPAROSCOPIC;  Surgeon: Quentin OreStechschulte, Paul J, MD;  Location: [facility];  Service: General;  Laterality: N/A;     OB History     Gravida  1   Para  1   Term  1   Preterm      AB      Living  1      SAB      IAB      Ectopic      Multiple      Live Births  1           Family History  Problem Relation Age of Onset   Diabetes Mother    Hypertension Mother    Esophageal cancer Maternal Grandmother    Esophageal cancer Maternal Grandfather    Diabetes Maternal Grandfather    Colon cancer Neg Hx    Colon  polyps Neg Hx    Gallbladder disease Neg Hx    Kidney disease Neg Hx     Social History   Tobacco Use   Smoking status: Never   Smokeless tobacco: Never  Vaping Use   Vaping Use: Never used  Substance Use Topics   Alcohol use: No   Drug use: No    Home Medications Prior to Admission medications   Medication Sig Start Date End Date Taking? Authorizing Provider  acetaminophen (TYLENOL) 500 MG tablet You can take 2 tablets every 6 hours as needed for pain.  You can alternate with plain ibuprofen.  You can buy this over-the-counter at any drugstore.  Do not take more than 4000 mg of Tylenol/acetaminophen per day, it can harm your liver. Patient taking differently: Take 1,000 mg by mouth every 6  (six) hours as needed (pain). Do not take more than 4000 mg of Tylenol/acetaminophen per day, it can harm your liver. 03/06/20  Yes Sherrie George, PA-C  Ascorbic Acid (VITAMIN C PO) Take 1 tablet by mouth daily.   Yes [provider]  aspirin-acetaminophen-caffeine (EXCEDRIN MIGRAINE) [phone] MG tablet Take 2 tablets by mouth daily as needed for migraine.   Yes [provider]  B Complex Vitamins (VITAMIN B COMPLEX) TABS Take 1 tablet by mouth daily.   Yes [provider]  benzonatate (TESSALON) 100 MG capsule Take 1 capsule (100 mg total) by mouth every 8 (eight) hours. 09/15/20  Yes Zoii Florer, Stevphen Meuse S, PA  doxycycline (VIBRAMYCIN) 100 MG capsule Take 1 capsule (100 mg total) by mouth 2 (two) times daily for 7 days. 09/15/20 09/22/20 Yes Arlan Birks S, PA  fluticasone (FLONASE) 50 MCG/ACT nasal spray Place 2 sprays into both nostrils daily for 14 days. 09/15/20 09/29/20 Yes Aloura Matsuoka S, PA  hyoscyamine (LEVSIN SL) 0.125 MG SL tablet Take 0.125 mg by mouth every 4 (four) hours as needed for cramping (stomach pain). 09/09/20  Yes [provider]  ketoconazole (NIZORAL) 2 % cream Apply topically daily as needed for irritation. Patient taking differently: Apply 1 application topically daily as needed for irritation. 05/01/20  Yes Paz, Nolon Rod, MD  Multiple Vitamins-Minerals (ZINC PO) Take 1 tablet by mouth daily.   Yes [provider]  pantoprazole (PROTONIX) 40 MG tablet Take 1 tablet (40 mg total) by mouth every morning. 06/19/20  Yes Paz, Nolon Rod, MD  promethazine-dextromethorphan (PROMETHAZINE-DM) 6.25-15 MG/5ML syrup Take 5 mLs by mouth 4 (four) times daily as needed for cough. 09/15/20  Yes Dago Jungwirth S, PA  sertraline (ZOLOFT) 100 MG tablet Take 1.5 tablets (150 mg total) by mouth daily. Patient taking differently: Take 150 mg by mouth at bedtime. 06/19/20  Yes Paz, Nolon Rod, MD  Vitamin D, Ergocalciferol, (DRISDOL) 1.25 MG (50000 UNIT) CAPS capsule Take 1  capsule (50,000 Units total) by mouth every 7 (seven) days. Patient taking differently: Take 50,000 Units by mouth every Thursday. 06/10/20  Yes Paz, Nolon Rod, MD  acetaZOLAMIDE (DIAMOX) 250 MG tablet Take 2 tablets (500 mg total) by mouth daily. Patient not taking: Reported on 09/15/2020 06/11/20   Shawnie Dapper, NP  amitriptyline (ELAVIL) 25 MG tablet Take 1 tablet (25 mg total) by mouth at bedtime. Patient not taking: Reported on 09/15/2020 06/05/20   Wanda Plump, MD    Allergies    Patient has no known allergies.  Review of Systems   Review of Systems  Constitutional:  Positive for chills and fever.  HENT:  Negative for congestion.  Eyes:  Negative for pain.  Respiratory:  Positive for cough and shortness of breath.        Hemoptysis   Cardiovascular:  Positive for chest pain. Negative for leg swelling.  Gastrointestinal:  Negative for abdominal pain and vomiting.  Genitourinary:  Negative for dysuria.  Musculoskeletal:  Negative for myalgias.  Skin:  Negative for rash.  Neurological:  Negative for dizziness and headaches.   Physical Exam Updated Vital Signs BP 124/66   Pulse 93   Temp 98.3 F (36.8 C) (Oral)   Resp 19   Ht 5\' 6"  (1.676 m)   Wt 127 kg   LMP 09/09/2020 (Approximate)   SpO2 96%   BMI 45.19 kg/m   Physical Exam Vitals and nursing note reviewed.  Constitutional:      General: She is not in acute distress.    Appearance: She is obese.     Comments: Uncomfortable appearing 45 year old female tachypneic but speaking in full sentences.  Pleasant, able answer questions appropriately follow commands.  HENT:     Head: Normocephalic and atraumatic.     Nose: Nose normal.  Eyes:     General: No scleral icterus. Neck:     Comments: No JVD Cardiovascular:     Rate and Rhythm: Normal rate and regular rhythm.     Pulses: Normal pulses.     Heart sounds: Normal heart sounds.  Pulmonary:     Effort: Pulmonary effort is normal. No respiratory distress.     Breath  sounds: No wheezing.     Comments: Tachypnea present  Patient is speaking full sentences  Lungs are clear to auscultation all fields  Trachea midline. Abdominal:     Palpations: Abdomen is soft.     Tenderness: There is no abdominal tenderness.  Musculoskeletal:     Cervical back: Normal range of motion.     Right lower leg: No edema.     Left lower leg: No edema.     Comments: No lower extremity edema no calf tenderness no bruising  Skin:    General: Skin is warm and dry.     Capillary Refill: Capillary refill takes less than 2 seconds.  Neurological:     Mental Status: She is alert. Mental status is at baseline.  Psychiatric:        Mood and Affect: Mood normal.        Behavior: Behavior normal.    ED Results / Procedures / Treatments   Labs (all labs ordered are listed, but only abnormal results are displayed) Labs Reviewed  CBC WITH DIFFERENTIAL/PLATELET - Abnormal; Notable for the following components:      Result Value   WBC 15.1 (*)    RBC 5.51 (*)    MCH 25.2 (*)    RDW 16.9 (*)    Platelets 427 (*)    Neutro Abs 12.3 (*)    Eosinophils Absolute 0.6 (*)    Abs Immature Granulocytes 0.08 (*)    All other components within normal limits  BASIC METABOLIC PANEL - Abnormal; Notable for the following components:   CO2 21 (*)    Glucose, Bld 107 (*)    All other components within normal limits  RESP PANEL BY RT-PCR (FLU A&B, COVID) ARPGX2  PROTIME-INR  D-DIMER, QUANTITATIVE  I-STAT BETA HCG BLOOD, ED ([facility], [facility], [facility] ONLY)  TROPONIN I (HIGH SENSITIVITY)    EKG EKG Interpretation  Date/Time:  Monday September 15 2020 15:59:14 EDT Ventricular Rate:  94 PR Interval:  127 QRS  Duration: 101 QT Interval:  348 QTC Calculation: 436 R Axis:   41 Text Interpretation: Sinus rhythm Low voltage, precordial leads No significant change since last tracing Confirmed by Susy Frizzle [phone]) on 09/15/2020 4:04:30 PM  Radiology DG Chest Portable 1 View  Result Date:  09/15/2020 CLINICAL DATA:  Shortness of breath and cough EXAM: PORTABLE CHEST 1 VIEW COMPARISON:  04/11/2015 FINDINGS: The heart size and mediastinal contours are within normal limits. Both lungs are clear. The visualized skeletal structures are unremarkable. IMPRESSION: No active disease. Electronically Signed   By: Judie Petit.  Shick M.D.   On: 09/15/2020 15:42    Procedures Procedures   Medications Ordered in ED Medications  benzonatate (TESSALON) capsule 200 mg (200 mg Oral Given 09/15/20 1603)  acetaminophen (TYLENOL) tablet 1,000 mg (1,000 mg Oral Given 09/15/20 1603)  lactated ringers bolus 1,000 mL (1,000 mLs Intravenous New Bag/Given 09/15/20 1643)  ketorolac (TORADOL) 30 MG/ML injection 30 mg (30 mg Intravenous Given 09/15/20 1831)    ED Course  I have reviewed the triage vital signs and the nursing notes.  Pertinent labs & imaging results that were available during my care of the patient were reviewed by me and considered in my medical decision making (see chart for details).    MDM Rules/Calculators/A&P                           Patient is a 45 year old female obese with a history of COVID-19 numerous months ago, anxiety depression and reflux  She is presenting today for chest pain shortness of breath she has had chest pain associated only with her cough but no other times that she had chest pain she is mildly tachycardic has had some hemoptysis which certainly merits consideration for pulmonary embolism however her chest pain is quite atypical and that it only occurs when she coughs.  She has had a scant hemoptysis.  No nausea or vomiting she has no exertional nor pleuritic symptoms and has no significant obvious risk factors for PE other than her obesity  She is tachycardic but also has somewhat dry appearing oral mucosa she is somewhat warm to touch as well.  Physical exam is otherwise relatively unremarkable her lungs are clear to auscultation her heart sounds are loud I have very low  suspicion for tamponade as she has no JVD no lower extremity edema and well auscultated heart sounds.  Chest x-ray unremarkable.  No enlargement of heart.  No pulmonary edema no infiltrate.  CBC with erythrocytosis, leukocytosis, thrombocytosis consistent with dehydration/hemoconcentration.  COVID influenza negative.  Coags within normal limits and D-dimer within normal limits.  Troponin within normal limits no new chest pain since her cough over the past 3 days.  I-STAT ECG negative for pregnancy.  EKG nonischemic no ST-T wave abnormalities of note.  Normal sinus rhythm.  Doubt PE as patient denies pleuritic component of CP, denies history of clot, active cancer, immobilization, surgery, known clotting disorder, is on no estrogen containing medications, denies calf pain and on physical exam has no unilateral leg swelling, calf TTP, or hypoxia.   Doubt ACS as patient has no significant risk factors for ACS (DM, HLD, HTN, obesity, CAD/CVA, family hx, smoking). Patient's CP is atypical for ACS. Is non-radiating, substernal, not described as chest pressure and is not precipitated/worsened by exertion.  Doubt thoracic aortic dissection as pain lacks tearing or severe quality and does not radiate to back. Patient denies nausea, diaphoresis. Symmetric pulses, no murmur, no  neurologic deficit. CXR without widened mediastinum.  Additionally patient has negative D-dimer.  Doubt pericarditis as no recent illness, PE without muffled heart sounds or friction rub, CP is non-pleuritic, EKG without global STE or PR depression.   Patient reevaluated after benzonatate, Toradol, LR, Tylenol.  She states she feels much improved tachypnea has resolved tachycardia has resolved.  Ambulated out difficulty during visit here.  No other episodes of hemoptysis although she has had continued cough.  Discharged home with cough medicine for the cough/mopped assist.  Very well may be bronchitis causing her symptoms however will  provide patient with doxycycline to cover for occult pneumonia and have follow-up with PCP and strict return precautions are given.  Vivian CESILY CUOCO was evaluated in Emergency Department on 09/16/2020 for the symptoms described in the history of present illness. She was evaluated in the context of the global COVID-19 pandemic, which necessitated consideration that the patient might be at risk for infection with the SARS-CoV-2 virus that causes COVID-19. Institutional protocols and algorithms that pertain to the evaluation of patients at risk for COVID-19 are in a state of rapid change based on information released by regulatory bodies including the CDC and federal and state organizations. These policies and algorithms were followed during the patient's care in the ED.   Final Clinical Impression(s) / ED Diagnoses Final diagnoses:  Cough  Hemoptysis  Shortness of breath    Rx / DC Orders ED Discharge Orders          Ordered    fluticasone (FLONASE) 50 MCG/ACT nasal spray  Daily        09/15/20 1852    doxycycline (VIBRAMYCIN) 100 MG capsule  2 times daily        09/15/20 1852    benzonatate (TESSALON) 100 MG capsule  Every 8 hours        09/15/20 1852    promethazine-dextromethorphan (PROMETHAZINE-DM) 6.25-15 MG/5ML syrup  4 times daily PRN        09/15/20 1852             Gailen Shelter, [place] 09/16/20 0022    Pollyann Savoy, MD 09/16/20 [phone]

## 2020-09-15 NOTE — Discharge Instructions (Addendum)
Your work-up today was quite reassuring.  Please take the to cough medications I have prescribed.  Benzonatate is a cough medicine in the form of a capsule.  I have also prescribed you Promethazine DM which is a cough syrup.  He may use this at night it may cause more drowsiness.  Please do sinus rinses I recommend Netti pot.  I also recommend Zyrtec or Allegra which is an over-the-counter medicine I have also prescribed you Flonase/fluticasone which is also over-the-counter use 2 sprays up each nostril twice daily.  Please follow-up with your primary care provider within the week.  Return to the ER for any new or concerning symptoms.  Please use Tylenol or ibuprofen for pain.  You may use 600 mg ibuprofen every 6 hours or 1000 mg of Tylenol every 6 hours.  You may choose to alternate between the 2.  This would be most effective.  Not to exceed 4 g of Tylenol within 24 hours.  Not to exceed 3200 mg ibuprofen 24 hours.    I have written you a prescription for doxycycline which is an antibiotic.  As we discussed, I doubt this to be a bacterial infection however if your symptoms are worse in 2 days than today you may begin taking doxycycline.   Overall I suspect this is a viral respiratory infection.  It should resolve with time.

## 2020-09-16 ENCOUNTER — Telehealth: Payer: Self-pay

## 2020-09-16 NOTE — Telephone Encounter (Signed)
Transition Care Management Follow-up Telephone Call Date of discharge and from where: 09/15/2020-[place]  How have you been since you were released from the hospital? Not feeling better, just picked up medication today to start.  Any questions or concerns? No  Items Reviewed: Did the pt receive and understand the discharge instructions provided? Yes  Medications obtained and verified? Yes  Other? No  Any new allergies since your discharge? No  Dietary orders reviewed? N/A Do you have support at home? Yes   Home Care and Equipment/Supplies: Were home health services ordered? not applicable If so, what is the name of the agency? N/A  Has the agency set up a time to come to the patient's home? not applicable Were any new equipment or medical supplies ordered?  No What is the name of the medical supply agency? N/A Were you able to get the supplies/equipment? not applicable Do you have any questions related to the use of the equipment or supplies? No  Functional Questionnaire: (I = Independent and D = Dependent) ADLs: I  Bathing/Dressing- I  Meal Prep- I  Eating- I  Maintaining continence- I  Transferring/Ambulation- I  Managing Meds- I  Follow up appointments reviewed:  PCP Hospital f/u appt confirmed? No   Specialist Hospital f/u appt confirmed? No   Are transportation arrangements needed? No  If their condition worsens, is the pt aware to call PCP or go to the Emergency Dept.? Yes Was the patient provided with contact information for the PCP's office or ED? Yes Was to pt encouraged to call back with questions or concerns? Yes

## 2020-10-03 ENCOUNTER — Ambulatory Visit: Payer: Medicaid Other | Admitting: Family Medicine

## 2020-10-03 ENCOUNTER — Encounter: Payer: Self-pay | Admitting: Family Medicine

## 2020-10-03 ENCOUNTER — Other Ambulatory Visit: Payer: Self-pay

## 2020-10-03 VITALS — BP 124/60 | HR 88 | Temp 98.0°F | Resp 18 | Ht 65.0 in | Wt 278.0 lb

## 2020-10-03 DIAGNOSIS — J40 Bronchitis, not specified as acute or chronic: Secondary | ICD-10-CM | POA: Diagnosis not present

## 2020-10-03 DIAGNOSIS — G8929 Other chronic pain: Secondary | ICD-10-CM

## 2020-10-03 DIAGNOSIS — M25561 Pain in right knee: Secondary | ICD-10-CM | POA: Diagnosis not present

## 2020-10-03 MED ORDER — PREDNISONE 10 MG PO TABS: ORAL_TABLET | ORAL | 0 refills | Status: DC

## 2020-10-03 MED ORDER — AZITHROMYCIN 250 MG PO TABS: ORAL_TABLET | ORAL | 0 refills | Status: DC

## 2020-10-03 MED ORDER — HYDROCOD POLST-CPM POLST ER 10-8 MG/5ML PO SUER: 5.0000 mL | Freq: Two times a day (BID) | ORAL | 0 refills | Status: DC | PRN

## 2020-10-03 MED ORDER — METHYLPREDNISOLONE ACETATE 80 MG/ML IJ SUSP
80.0000 mg | Freq: Once | INTRAMUSCULAR | Status: AC
  Administered 2020-10-03: 80 mg via INTRAMUSCULAR

## 2020-10-03 NOTE — Patient Instructions (Signed)
Acute Bronchitis, Adult  Acute bronchitis is sudden or acute swelling of the air tubes (bronchi) in the lungs. Acute bronchitis causes these tubes to fill with mucus, whichcan make it hard to breathe. It can also cause coughing or wheezing. In adults, acute bronchitis usually goes away within 2 weeks. A cough caused by bronchitis may last up to 3 weeks. Smoking, allergies, and asthma can make thecondition worse. What are the causes? This condition can be caused by germs and by substances that irritate the lungs, including: Cold and flu viruses. The most common cause of this condition is the virus that causes the common cold. Bacteria. Substances that irritate the lungs, including: Smoke from cigarettes and other forms of tobacco. Dust and pollen. Fumes from chemical products, gases, or burned fuel. Other materials that pollute indoor or outdoor air. Close contact with someone who has acute bronchitis. What increases the risk? The following factors may make you more likely to develop this condition: A weak body's defense system, also called the immune system. A condition that affects your lungs and breathing, such as asthma. What are the signs or symptoms? Common symptoms of this condition include: Lung and breathing problems, such as: Coughing. This may bring up clear, yellow, or green mucus from your lungs (sputum). Wheezing. Having too much mucus in your lungs (chest congestion). Having shortness of breath. A fever. Chills. Aches and pains, including: Tightness in your chest and other body aches. A sore throat. How is this diagnosed? This condition is usually diagnosed based on: Your symptoms and medical history. A physical exam. You may also have other tests, including tests to rule out other conditions, such as pneumonia. These tests include: A test of lung function. Test of a mucus sample to look for the presence of bacteria. Tests to check the oxygen level in your  blood. Blood tests. Chest X-ray. How is this treated? Most cases of acute bronchitis clear up over time without treatment. Your health care provider may recommend: Drinking more fluids. This can thin your mucus, which may improve your breathing. Using a device that gets medicine into your lungs (inhaler) to help improve breathing and control coughing. Using a vaporizer or a humidifier. These are machines that add water to the air to help you breathe better. Taking a medicine for a fever. Taking a medicine that thins mucus and clears congestion (expectorant). Taking a medicine that prevents or stops coughing (cough suppressant). Follow these instructions at home: Activity Get plenty of rest. Return to your normal activities as told by your health care provider. Ask your health care provider what activities are safe for you. Lifestyle  Drink enough fluid to keep your urine pale yellow. Do not drink alcohol. Do not use any products that contain nicotine or tobacco, such as cigarettes, e-cigarettes, and chewing tobacco. If you need help quitting, ask your health care provider. Be aware that: Your bronchitis will get worse if you smoke or breathe in other people's smoke (secondhand smoke). Your lungs will heal faster if you quit smoking.  General instructions Take over-the-counter and prescription medicines only as told by your health care provider. Use an inhaler, vaporizer, or humidifier as told by your health care provider. If you have a sore throat, gargle with a salt-water mixture 3-4 times a day or as needed. To make a salt-water mixture, completely dissolve -1 tsp (3-6 g) of salt in 1 cup (237 mL) of warm water. Take two teaspoons of honey at bedtime to lessen coughing at night.   Keep all follow-up visits as told by your health care provider. This is important. How is this prevented? To lower your risk of getting this condition again: Wash your hands often with soap and water. If  soap and water are not available, use hand sanitizer. Avoid contact with people who have cold symptoms. Try not to touch your mouth, nose, or eyes with your hands. Avoid places where there are fumes from chemicals. Breathing these fumes will make your condition worse. Get the flu shot every year. Contact a health care provider if: Your symptoms do not improve after 2 weeks of treatment. You vomit more than once or twice. You have symptoms of dehydration such as: Dark urine. Dry skin or eyes. Increased thirst. Headaches. Confusion. Muscle cramps. Get help right away if you: Cough up blood. Feel pain in your chest. Have severe shortness of breath. Faint or keep feeling like you are going to faint. Have a severe headache. Have fever or chills that get worse. These symptoms may represent a serious problem that is an emergency. Do not wait to see if the symptoms will go away. Get medical help right away. Call your local emergency services (911 in the [place] Do not drive yourself to the hospital. Summary Acute bronchitis is sudden (acute) inflammation of the air tubes (bronchi) between the windpipe and the lungs. In adults, acute bronchitis usually goes away within 2 weeks, although coughing may last 3 weeks or longer. Take over-the-counter and prescription medicines only as told by your health care provider. Drink enough fluid to keep your urine pale yellow. Contact a health care provider if your symptoms do not improve after 2 weeks of treatment. Get help right away if you cough up blood, faint, or have chest pain or shortness of breath. This information is not intended to replace advice given to you by your health care provider. Make sure you discuss any questions you have with your healthcare provider. Document Revised: 11/28/2019 Document Reviewed: 07/21/2018 Elsevier Patient Education  2022 Elsevier Inc.  

## 2020-10-03 NOTE — Assessment & Plan Note (Signed)
Refer to sport med  

## 2020-10-03 NOTE — Assessment & Plan Note (Signed)
abx per orders pred taper  Depo medrol 80 mg  Cough med per orders  F/u pcp if no improvement

## 2020-10-03 NOTE — Progress Notes (Signed)
Subjective:   By signing my name below, I, Zite Okoli, attest that this documentation has been prepared under the direction and in the presence of Donato Schultz, DO. 10/03/2020     Patient ID: Stephanie Davies, female    DOB: Oct 20, 1975, 45 y.o.   MRN: [id number]  Chief Complaint  Patient presents with   Cough    Pt states was seen at the ED for cough and states cough continues and is worse at night.    Follow-up    HPI Patient is in today for an office visit.  She was seen in the ER on 09/15/2020 for cough, sputum production, chest pain and shortness of breath. She was prescribed antibiotics and has finished the course and the cough medication. She reports the cough is better than when she went to the ER but mentions that the cough is worse at night and sometimes, she feels irritated in her throat. After finishing her medication, she started using Dayquil but they provide little relief. She also reports wheezing and sputum production.  She also reports feeling constipated for the past 2 weeks. She uses Dolex but after the bowel movements, she feels constipated again.   She would also like a referral to Dr Pearletha Forge for the pain in her right knee.   It has been a few years since she has seen him.    Past Medical History:  Diagnosis Date   Alopecia    Anal fistula    Anxiety and depression 03/07/2014   COVID    GERD (gastroesophageal reflux disease) 02/14/2014   Goiter 03/07/2014   Headache(784.0)    Hemorrhoids    Kidney stone    Pseudotumor cerebri     Past Surgical History:  Procedure Laterality Date   CHOLECYSTECTOMY  2004   kidney stent: 12/2018 at [facility]     LAPAROSCOPIC APPENDECTOMY N/A 03/05/2020   Procedure: APPENDECTOMY LAPAROSCOPIC;  Surgeon: Quentin Ore, MD;  Location: [facility];  Service: General;  Laterality: N/A;    Family History  Problem Relation Age of Onset   Diabetes Mother    Hypertension Mother    Esophageal cancer Maternal Grandmother     Esophageal cancer Maternal Grandfather    Diabetes Maternal Grandfather    Colon cancer Neg Hx    Colon polyps Neg Hx    Gallbladder disease Neg Hx    Kidney disease Neg Hx     Social History   Socioeconomic History   Marital status: Married    Spouse name: Not on file   Number of children: 1   Years of education: Not on file   Highest education level: Bachelor's degree (e.g., BA, AB, BS)  Occupational History   Occupation: stay home  Tobacco Use   Smoking status: Never   Smokeless tobacco: Never  Vaping Use   Vaping Use: Never used  Substance and Sexual Activity   Alcohol use: No   Drug use: No   Sexual activity: Not Currently    Birth control/protection: None  Other Topics Concern   Not on file  Social History Narrative   Original from from [place]   Lives w/ mother, husband overseas ([place]   Student-- [employer]   G1P1, daughter 33   Social Determinants of Health   Financial Resource Strain: Not on file  Food Insecurity: Not on file  Transportation Needs: Not on file  Physical Activity: Not on file  Stress: Not on file  Social Connections: Not on file  Intimate Partner Violence: Not on file    Outpatient Medications Prior to Visit  Medication Sig Dispense Refill   acetaminophen (TYLENOL) 500 MG tablet You can take 2 tablets every 6 hours as needed for pain.  You can alternate with plain ibuprofen.  You can buy this over-the-counter at any drugstore.  Do not take more than 4000 mg of Tylenol/acetaminophen per day, it can harm your liver. (Patient taking differently: Take 1,000 mg by mouth every 6 (six) hours as needed (pain). Do not take more than 4000 mg of Tylenol/acetaminophen per day, it can harm your liver.)     acetaZOLAMIDE (DIAMOX) 250 MG tablet Take 2 tablets (500 mg total) by mouth daily. 60 tablet 3   Ascorbic Acid (VITAMIN C PO) Take 1 tablet by mouth daily.     aspirin-acetaminophen-caffeine (EXCEDRIN MIGRAINE) 250-250-65 MG tablet Take 2  tablets by mouth daily as needed for migraine.     B Complex Vitamins (VITAMIN B COMPLEX) TABS Take 1 tablet by mouth daily.     benzonatate (TESSALON) 100 MG capsule Take 1 capsule (100 mg total) by mouth every 8 (eight) hours. 21 capsule 0   hyoscyamine (LEVSIN SL) 0.125 MG SL tablet Take 0.125 mg by mouth every 4 (four) hours as needed for cramping (stomach pain).     ketoconazole (NIZORAL) 2 % cream Apply topically daily as needed for irritation. (Patient taking differently: Apply 1 application topically daily as needed for irritation.) 30 g 0   Multiple Vitamins-Minerals (ZINC PO) Take 1 tablet by mouth daily.     pantoprazole (PROTONIX) 40 MG tablet Take 1 tablet (40 mg total) by mouth every morning. 90 tablet 1   sertraline (ZOLOFT) 100 MG tablet Take 1.5 tablets (150 mg total) by mouth daily. (Patient taking differently: Take 150 mg by mouth at bedtime.) 135 tablet 1   Vitamin D, Ergocalciferol, (DRISDOL) 1.25 MG (50000 UNIT) CAPS capsule Take 1 capsule (50,000 Units total) by mouth every 7 (seven) days. (Patient taking differently: Take 50,000 Units by mouth every Thursday.) 12 capsule 0   promethazine-dextromethorphan (PROMETHAZINE-DM) 6.25-15 MG/5ML syrup Take 5 mLs by mouth 4 (four) times daily as needed for cough. 118 mL 0   amitriptyline (ELAVIL) 25 MG tablet Take 1 tablet (25 mg total) by mouth at bedtime. (Patient not taking: No sig reported) 30 tablet 1   fluticasone (FLONASE) 50 MCG/ACT nasal spray Place 2 sprays into both nostrils daily for 14 days. 11.1 mL 0   No facility-administered medications prior to visit.    No Known Allergies  Review of Systems  Constitutional:  Negative for fever.  HENT:  Negative for congestion, ear pain, hearing loss, sinus pain and sore throat.   Eyes:  Negative for blurred vision and pain.  Respiratory:  Positive for cough, sputum production and wheezing. Negative for shortness of breath.   Cardiovascular:  Negative for chest pain and  palpitations.  Gastrointestinal:  Positive for constipation. Negative for blood in stool, diarrhea, nausea and vomiting.  Genitourinary:  Negative for dysuria, frequency, hematuria and urgency.  Musculoskeletal:  Positive for joint pain (right knee). Negative for back pain, falls and myalgias.  Neurological:  Negative for dizziness, sensory change, loss of consciousness, weakness and headaches.  Endo/Heme/Allergies:  Negative for environmental allergies. Does not bruise/bleed easily.  Psychiatric/Behavioral:  Negative for depression and suicidal ideas. The patient is not nervous/anxious and does not have insomnia.       Objective:    Physical Exam Constitutional:  General: She is not in acute distress.    Appearance: Normal appearance. She is not ill-appearing.  HENT:     Head: Normocephalic and atraumatic.     Right Ear: External ear normal.     Left Ear: External ear normal.  Eyes:     Extraocular Movements: Extraocular movements intact.     Pupils: Pupils are equal, round, and reactive to light.  Cardiovascular:     Rate and Rhythm: Normal rate and regular rhythm.     Pulses: Normal pulses.     Heart sounds: Normal heart sounds. No murmur heard.   No gallop.  Pulmonary:     Effort: Pulmonary effort is normal. No respiratory distress.     Breath sounds: Wheezing present. No rhonchi or rales.  Abdominal:     General: Bowel sounds are normal. There is no distension.     Palpations: Abdomen is soft. There is no mass.     Tenderness: There is no abdominal tenderness. There is no guarding or rebound.     Hernia: No hernia is present.  Musculoskeletal:     Cervical back: Normal range of motion and neck supple.  Lymphadenopathy:     Cervical: No cervical adenopathy.  Skin:    General: Skin is warm and dry.  Neurological:     Mental Status: She is alert and oriented to person, place, and time.  Psychiatric:        Behavior: Behavior normal.    BP 124/60 (BP Location: Left  Arm, Patient Position: Sitting, Cuff Size: Large)   Pulse 88   Temp 98 F (36.7 C) (Oral)   Resp 18   Ht 5\' 5"  (1.651 m)   Wt 278 lb (126.1 kg)   LMP 09/09/2020 (Approximate)   SpO2 98%   BMI 46.26 kg/m  Wt Readings from Last 3 Encounters:  10/03/20 278 lb (126.1 kg)  09/15/20 280 lb (127 kg)  06/05/20 279 lb (126.6 kg)    Diabetic Foot Exam - Simple   No data filed    Lab Results  Component Value Date   WBC 15.1 (H) 09/15/2020   HGB 13.9 09/15/2020   HCT 44.3 09/15/2020   PLT 427 (H) 09/15/2020   GLUCOSE 107 (H) 09/15/2020   ALT 26 03/23/2020   AST 24 03/23/2020   NA 137 09/15/2020   K 3.6 09/15/2020   CL 108 09/15/2020   CREATININE 0.75 09/15/2020   BUN 8 09/15/2020   CO2 21 (L) 09/15/2020   TSH 0.87 06/05/2020   INR 1.0 09/15/2020   HGBA1C 5.6 07/19/2016    Lab Results  Component Value Date   TSH 0.87 06/05/2020   Lab Results  Component Value Date   WBC 15.1 (H) 09/15/2020   HGB 13.9 09/15/2020   HCT 44.3 09/15/2020   MCV 80.4 09/15/2020   PLT 427 (H) 09/15/2020   Lab Results  Component Value Date   NA 137 09/15/2020   K 3.6 09/15/2020   CO2 21 (L) 09/15/2020   GLUCOSE 107 (H) 09/15/2020   BUN 8 09/15/2020   CREATININE 0.75 09/15/2020   BILITOT 0.6 03/23/2020   ALKPHOS 105 03/23/2020   AST 24 03/23/2020   ALT 26 03/23/2020   PROT 7.7 03/23/2020   ALBUMIN 3.8 03/23/2020   CALCIUM 9.3 09/15/2020   ANIONGAP 8 09/15/2020   GFR 86.15 07/23/2019   No results found for: CHOL No results found for: HDL No results found for: LDLCALC No results found for: TRIG No results found  for: CHOLHDL Lab Results  Component Value Date   HGBA1C 5.6 07/19/2016       Assessment & Plan:   Problem List Items Addressed This Visit       Unprioritized   Bronchitis - Primary    abx per orders pred taper  Depo medrol 80 mg  Cough med per orders  F/u pcp if no improvement       Relevant Medications   azithromycin (ZITHROMAX Z-PAK) 250 MG tablet    predniSONE (DELTASONE) 10 MG tablet   chlorpheniramine-HYDROcodone (TUSSIONEX PENNKINETIC ER) 10-8 MG/5ML SUER   Chronic pain of right knee    Refer to sport med      Relevant Orders   Ambulatory referral to Sports Medicine    Meds ordered this encounter  Medications   azithromycin (ZITHROMAX Z-PAK) 250 MG tablet    Sig: As directed    Dispense:  6 each    Refill:  0   predniSONE (DELTASONE) 10 MG tablet    Sig: TAKE 3 TABLETS PO QD FOR 3 DAYS THEN TAKE 2 TABLETS PO QD FOR 3 DAYS THEN TAKE 1 TABLET PO QD FOR 3 DAYS THEN TAKE 1/2 TAB PO QD FOR 3 DAYS    Dispense:  20 tablet    Refill:  0   chlorpheniramine-HYDROcodone (TUSSIONEX PENNKINETIC ER) 10-8 MG/5ML SUER    Sig: Take 5 mLs by mouth every 12 (twelve) hours as needed for cough.    Dispense:  115 mL    Refill:  0   methylPREDNISolone acetate (DEPO-MEDROL) injection 80 mg     I,Zite Okoli,acting as a scribe for [employer], DO.,have documented all relevant documentation on the behalf of Donato Schultz, DO,as directed by  Donato Schultz, DO while in the presence of Donato Schultz, DO.   I, Donato Schultz, DO. , personally preformed the services described in this documentation.  All medical record entries made by the scribe were at my direction and in my presence.  I have reviewed the chart and discharge instructions (if applicable) and agree that the record reflects my personal performance and is accurate and complete. 10/03/2020

## 2020-10-07 ENCOUNTER — Ambulatory Visit: Payer: Medicaid Other | Admitting: Gastroenterology

## 2020-10-07 ENCOUNTER — Encounter: Payer: Self-pay | Admitting: Gastroenterology

## 2020-10-07 DIAGNOSIS — R109 Unspecified abdominal pain: Secondary | ICD-10-CM

## 2020-10-07 DIAGNOSIS — R140 Abdominal distension (gaseous): Secondary | ICD-10-CM | POA: Diagnosis not present

## 2020-10-07 DIAGNOSIS — R935 Abnormal findings on diagnostic imaging of other abdominal regions, including retroperitoneum: Secondary | ICD-10-CM

## 2020-10-07 MED ORDER — NA SULFATE-K SULFATE-MG SULF 17.5-3.13-1.6 GM/177ML PO SOLN: 1.0000 | ORAL | 0 refills | Status: DC

## 2020-10-07 MED ORDER — LINACLOTIDE 145 MCG PO CAPS: 145.0000 ug | ORAL_CAPSULE | Freq: Every day | ORAL | 0 refills | Status: DC

## 2020-10-07 MED ORDER — HYOSCYAMINE SULFATE 0.125 MG SL SUBL
0.1250 mg | SUBLINGUAL_TABLET | SUBLINGUAL | 3 refills | Status: DC | PRN
Start: 2020-10-07 — End: 2021-01-15

## 2020-10-07 NOTE — Progress Notes (Signed)
Referring Provider: Colon Branch, MD Primary Care Physician:  Colon Branch, MD  Chief complaint:  Abdominal pain   IMPRESSION:  Acute on chronic left sided abdominal pain with associated bloating Chronic constipation History of upper abdominal pain improved after appendectomy Mild thrombocytosis   Abdominal pain is associated with constipation and bloating. Other etiologies of organic disease must be excluded. Although there may be a functional component.    PLAN: - Resume Hyoscyamine 0.125 mg q4 hours - Add Linzess 145 mcg daily - EGD - Colonoscopy with a 2 day bowel prep  Please see the "Patient Instructions" section for addition details about the plan.  HPI: Stephanie Davies is a 45 y.o. female originally seen in 2021 for abdominal pain.  Endoscopic evaluation recommended at that time but not performed.  She returns today with ongoing complaints.  The interval history is obtained through the patient and review of her electronic health record.  She has anxiety, depression, obstructive sleep apnea on CPAP, chronic myalgias, prior cholecystectomy for gallstones 2004.  Previously seen by Dr. Fuller Plan in 2012 and Dr. Deatra Ina 2016.   She reports a chronic, constant, left flank pain that radiates to the groin as well as an upper abdominal pain. Three ER visits for similar symptoms. Ultimately found to have appendicitis. Upper abdominal pain has improved after appendectomy, but the the left sided abdominal pain persists. Pain is constant. Worse with constipation. Using OTC dulcolax for constipation. No reflux. Associated bloating. No change with eating or movement. There is no blood or mucus in the stool.   EGD in [place] 2 years ago for similar symptoms.  Most recent abdominal imaging: CT of the abdomen and pelvis with contrast 03/23/20: nonobstructing punctate renal calculi, interval appendectomy, stable borderline splenomegaly, no acute process  Labs 09/15/20: normal BMP except glucose 107,  hemoglobin 13.9, platelets 427  Maternal grandmother and grandfather with esophageal cancer. No known family history of colon cancer or polyps. No family history of uterine/endometrial cancer, pancreatic cancer or gastric/stomach cancer.    Past Medical History:  Diagnosis Date   Alopecia    Anal fistula    Anxiety and depression 03/07/2014   COVID    GERD (gastroesophageal reflux disease) 02/14/2014   Goiter 03/07/2014   Headache(784.0)    Hemorrhoids    Kidney stone    Pseudotumor cerebri     Past Surgical History:  Procedure Laterality Date   CHOLECYSTECTOMY  2004   kidney stent: 12/2018 at [facility] N/A 03/05/2020   Procedure: APPENDECTOMY LAPAROSCOPIC;  Surgeon: Felicie Morn, MD;  Location: [facility];  Service: General;  Laterality: N/A;    Current Outpatient Medications  Medication Sig Dispense Refill   acetaminophen (TYLENOL) 500 MG tablet You can take 2 tablets every 6 hours as needed for pain.  You can alternate with plain ibuprofen.  You can buy this over-the-counter at any drugstore.  Do not take more than 4000 mg of Tylenol/acetaminophen per day, it can harm your liver. (Patient taking differently: Take 1,000 mg by mouth every 6 (six) hours as needed (pain). Do not take more than 4000 mg of Tylenol/acetaminophen per day, it can harm your liver.)     acetaZOLAMIDE (DIAMOX) 250 MG tablet Take 2 tablets (500 mg total) by mouth daily. 60 tablet 3   amitriptyline (ELAVIL) 25 MG tablet Take 1 tablet (25 mg total) by mouth at bedtime. 30 tablet 1   Ascorbic Acid (VITAMIN C PO) Take 1 tablet  by mouth daily.     aspirin-acetaminophen-caffeine (EXCEDRIN MIGRAINE) 250-250-65 MG tablet Take 2 tablets by mouth daily as needed for migraine.     azithromycin (ZITHROMAX Z-PAK) 250 MG tablet As directed 6 each 0   B Complex Vitamins (VITAMIN B COMPLEX) TABS Take 1 tablet by mouth daily.     chlorpheniramine-HYDROcodone (TUSSIONEX PENNKINETIC ER) 10-8  MG/5ML SUER Take 5 mLs by mouth every 12 (twelve) hours as needed for cough. 115 mL 0   ketoconazole (NIZORAL) 2 % cream Apply topically daily as needed for irritation. (Patient taking differently: Apply 1 application topically daily as needed for irritation.) 30 g 0   linaclotide (LINZESS) 145 MCG CAPS capsule Take 1 capsule (145 mcg total) by mouth daily before breakfast. 30 capsule 0   Multiple Vitamins-Minerals (ZINC PO) Take 1 tablet by mouth daily.     Na Sulfate-K Sulfate-Mg Sulf (SUPREP BOWEL PREP KIT) 17.5-3.13-1.6 GM/177ML SOLN Take 1 kit by mouth as directed. 324 mL 0   pantoprazole (PROTONIX) 40 MG tablet Take 1 tablet (40 mg total) by mouth every morning. 90 tablet 1   predniSONE (DELTASONE) 10 MG tablet TAKE 3 TABLETS PO QD FOR 3 DAYS THEN TAKE 2 TABLETS PO QD FOR 3 DAYS THEN TAKE 1 TABLET PO QD FOR 3 DAYS THEN TAKE 1/2 TAB PO QD FOR 3 DAYS 20 tablet 0   sertraline (ZOLOFT) 100 MG tablet Take 1.5 tablets (150 mg total) by mouth daily. (Patient taking differently: Take 150 mg by mouth at bedtime.) 135 tablet 1   Vitamin D, Ergocalciferol, (DRISDOL) 1.25 MG (50000 UNIT) CAPS capsule Take 1 capsule (50,000 Units total) by mouth every 7 (seven) days. (Patient taking differently: Take 50,000 Units by mouth every Thursday.) 12 capsule 0   hyoscyamine (LEVSIN SL) 0.125 MG SL tablet Take 1 tablet (0.125 mg total) by mouth every 4 (four) hours as needed for cramping (stomach pain). 30 tablet 3   No current facility-administered medications for this visit.    Allergies as of 10/07/2020   (No Known Allergies)    Family History  Problem Relation Age of Onset   Diabetes Mother    Hypertension Mother    Esophageal cancer Maternal Grandmother    Esophageal cancer Maternal Grandfather    Diabetes Maternal Grandfather    Colon cancer Neg Hx    Colon polyps Neg Hx    Gallbladder disease Neg Hx    Kidney disease Neg Hx      Physical Exam: General:   Alert,  well-nourished, pleasant and  cooperative in NAD Head:  Normocephalic and atraumatic. Eyes:  Sclera clear, no icterus.   Conjunctiva pink. Abdomen:  Soft, I am unable to reproduce her pain which she localized to the left rib/flank down the right flank, nondistended, normal bowel sounds, no rebound or guarding. No hepatosplenomegaly.   Neurologic:  Alert and  oriented x4;  grossly nonfocal Skin:  Intact without significant lesions or rashes. Psych:  Alert and cooperative. Normal mood and affect.    Jamoni Hewes L. Tarri Glenn, MD, MPH 10/07/2020, 10:03 PM

## 2020-10-07 NOTE — Patient Instructions (Addendum)
If you are age 43 or younger, your body mass index should be between 19-25. Your Body mass index is 46.32 kg/m. If this is out of the aformentioned range listed, please consider follow up with your Primary Care Provider.  __________________________________________________________  The [facility] providers would like to encourage you to use [facility] to communicate with providers for non-urgent requests or questions.  Due to long hold times on the telephone, sending your provider a message by [facility] may be a faster and more efficient way to get a response.  Please allow 48 business hours for a response.  Please remember that this is for non-urgent requests.   I have recommended an upper endoscopy and colonoscopy to further evaluate your abdominal pain.  In the meantime, you can use the hyoscyamine every 4 hours as needed for abdominal pain.  I have also recommended Linzess to help with the constipation and the abdominal pain. The most common side effect of Linzess is diarrhea. If you are having more frequent stools that you want, please adjust the frequent of the Linzess. I work with some people who take it every other day or even only on the weekends for symptom control.  Please let me know if you have any questions or concerns before your endoscopy and colonoscopy.  You have been scheduled for an endoscopy and colonoscopy. Please follow the written instructions given to you at your visit today. Please pick up your prep supplies at the pharmacy within the next 1-3 days. If you use inhalers (even only as needed), please bring them with you on the day of your procedure.  Due to recent changes in healthcare laws, you may see the results of your imaging and laboratory studies on MyChart before your provider has had a chance to review them.  We understand that in some cases there may be results that are confusing or concerning to you. Not all laboratory results come back in the same time frame and the  provider may be waiting for multiple results in order to interpret others.  Please give [place] 48 hours in order for your provider to thoroughly review all the results before contacting the office for clarification of your results.   We have sent the following medications to your pharmacy for you to pick up at your convenience:  START: Hyoscyamine 0.125 mg one tablet every 4 hours as needed.  START: Linzess 145 mcg daily (samples given)  Thank you for entrusting me with your care and choosing [facility].  Dr Orvan Falconer

## 2020-10-15 ENCOUNTER — Telehealth: Payer: Self-pay | Admitting: Gastroenterology

## 2020-10-15 NOTE — Telephone Encounter (Signed)
Left message for patient to return call. Will also send patient a message in MyChart to let her know of changes made to prep since insurance will not cover Suprep.

## 2020-10-15 NOTE — Telephone Encounter (Signed)
This appears to be something you were working on 

## 2020-10-15 NOTE — Telephone Encounter (Signed)
Inbound call from patient. States suprep is not covered by her insurance.

## 2020-10-16 ENCOUNTER — Encounter: Payer: Medicaid Other | Admitting: Gastroenterology

## 2020-10-16 NOTE — Telephone Encounter (Signed)
Returned call to patient to inform her that we have given her Miralax prep instead of Suprep.  Patient advised that set of new instructions were sent to her MyChart.  Patient stated she was not able to locate new instructions in MyChart.  Patient prep instructions reviewed with patient and she was advised that instructions will be left at front desk for pick up.  Patient agreed to plan and verbalized understanding.  No further questions.

## 2020-10-16 NOTE — Telephone Encounter (Signed)
Pt just called and said Suprep needs to have a preauthorization marked "urgent" and sent to [email or web address] or called into [id number] [phone], option 1. From the conversation I had with her, it does not appear she has seen your last message.  Please call and advise.

## 2020-10-20 NOTE — Telephone Encounter (Signed)
Called pt to provide reassurance. Advised to proceed with prep as written beginning today and into tomorrow (standard split dose bowl prep). Recommended she ensure she is drinking plenty of fluids, including Gatorade for electrolyte replenishment. Will proceed with colonoscopy as planned. Verbalized acceptance and understanding.

## 2020-10-20 NOTE — Telephone Encounter (Signed)
CLOSED AS A REPEAT MESSAGE

## 2020-10-21 ENCOUNTER — Encounter: Payer: Self-pay | Admitting: Gastroenterology

## 2020-10-21 ENCOUNTER — Ambulatory Visit (AMBULATORY_SURGERY_CENTER): Payer: Medicaid Other | Admitting: Gastroenterology

## 2020-10-21 VITALS — BP 108/80 | HR 86 | Temp 98.6°F | Resp 26 | Ht 65.0 in | Wt 278.0 lb

## 2020-10-21 DIAGNOSIS — R933 Abnormal findings on diagnostic imaging of other parts of digestive tract: Secondary | ICD-10-CM | POA: Diagnosis not present

## 2020-10-21 DIAGNOSIS — K2970 Gastritis, unspecified, without bleeding: Secondary | ICD-10-CM | POA: Diagnosis not present

## 2020-10-21 DIAGNOSIS — K259 Gastric ulcer, unspecified as acute or chronic, without hemorrhage or perforation: Secondary | ICD-10-CM | POA: Diagnosis not present

## 2020-10-21 DIAGNOSIS — K648 Other hemorrhoids: Secondary | ICD-10-CM

## 2020-10-21 DIAGNOSIS — K2990 Gastroduodenitis, unspecified, without bleeding: Secondary | ICD-10-CM | POA: Diagnosis not present

## 2020-10-21 DIAGNOSIS — R935 Abnormal findings on diagnostic imaging of other abdominal regions, including retroperitoneum: Secondary | ICD-10-CM

## 2020-10-21 DIAGNOSIS — R109 Unspecified abdominal pain: Secondary | ICD-10-CM | POA: Diagnosis not present

## 2020-10-21 HISTORY — PX: COLONOSCOPY WITH ESOPHAGOGASTRODUODENOSCOPY (EGD): SHX5779

## 2020-10-21 MED ORDER — PANTOPRAZOLE SODIUM 40 MG PO TBEC: DELAYED_RELEASE_TABLET | ORAL | 3 refills | Status: DC

## 2020-10-21 MED ORDER — SODIUM CHLORIDE 0.9 % IV SOLN: 500.0000 mL | Freq: Once | INTRAVENOUS | Status: DC

## 2020-10-21 NOTE — Patient Instructions (Signed)
Handouts Provided:  Gastritis  YOU HAD AN ENDOSCOPIC PROCEDURE TODAY AT THE [facility]:   Refer to the procedure report that was given to you for any specific questions about what was found during the examination.  If the procedure report does not answer your questions, please call your gastroenterologist to clarify.  If you requested that your care partner not be given the details of your procedure findings, then the procedure report has been included in a sealed envelope for you to review at your convenience later.  YOU SHOULD EXPECT: Some feelings of bloating in the abdomen. Passage of more gas than usual.  Walking can help get rid of the air that was put into your GI tract during the procedure and reduce the bloating. If you had a lower endoscopy (such as a colonoscopy or flexible sigmoidoscopy) you may notice spotting of blood in your stool or on the toilet paper. If you underwent a bowel prep for your procedure, you may not have a normal bowel movement for a few days.  Please Note:  You might notice some irritation and congestion in your nose or some drainage.  This is from the oxygen used during your procedure.  There is no need for concern and it should clear up in a day or so.  SYMPTOMS TO REPORT IMMEDIATELY:  Following lower endoscopy (colonoscopy or flexible sigmoidoscopy):  Excessive amounts of blood in the stool  Significant tenderness or worsening of abdominal pains  Swelling of the abdomen that is new, acute  Fever of 100F or higher  Following upper endoscopy (EGD)  Vomiting of blood or coffee ground material  New chest pain or pain under the shoulder blades  Painful or persistently difficult swallowing  New shortness of breath  Fever of 100F or higher  Black, tarry-looking stools  For urgent or emergent issues, a gastroenterologist can be reached at any hour by calling [phone]. Do not use MyChart messaging for urgent concerns.    DIET:  We do  recommend a small meal at first, but then you may proceed to your regular diet.  Drink plenty of fluids but you should avoid alcoholic beverages for 24 hours.  ACTIVITY:  You should plan to take it easy for the rest of today and you should NOT DRIVE or use heavy machinery until tomorrow (because of the sedation medicines used during the test).    FOLLOW UP: Our staff will call the number listed on your records 48-72 hours following your procedure to check on you and address any questions or concerns that you may have regarding the information given to you following your procedure. If we do not reach you, we will leave a message.  We will attempt to reach you two times.  During this call, we will ask if you have developed any symptoms of COVID 19. If you develop any symptoms (ie: fever, flu-like symptoms, shortness of breath, cough etc.) before then, please call [phone].  If you test positive for Covid 19 in the 2 weeks post procedure, please call and report this information to [place].    If any biopsies were taken you will be contacted by phone or by letter within the next 1-3 weeks.  Please call us at [phone] if you have not heard about the biopsies in 3 weeks.    SIGNATURES/CONFIDENTIALITY: You and/or your care partner have signed paperwork which will be entered into your electronic medical record.  These signatures attest to the fact that that the  information above on your After Visit Summary has been reviewed and is understood.  Full responsibility of the confidentiality of this discharge information lies with you and/or your care-partner.

## 2020-10-21 NOTE — Progress Notes (Signed)
Referring Provider: Wanda Plump, MD Primary Care Physician:  Wanda Plump, MD  Reason for procedure:  Abdominal pain   IMPRESSION:  Acute on chronic left sided abdominal pain with associated bloating Chronic constipation History of upper abdominal pain improved after appendectomy Mild thrombocytosis Appropriate candidate for monitored anesthesia care    PLAN: EGD and Colonoscopy    HPI: Lisanne JANIJAH SYMONS is a 45 y.o. female originally seen in 2021 for abdominal pain.  Endoscopic evaluation recommended at that time but not performed.  She returns today with ongoing complaints.  The interval history is obtained through the patient and review of her electronic health record.  She has anxiety, depression, obstructive sleep apnea on CPAP, chronic myalgias, prior cholecystectomy for gallstones 2004.  Previously seen by Dr. Russella Dar in 2012 and Dr. Arlyce Dice 2016.   She reports a chronic, constant, left flank pain that radiates to the groin as well as an upper abdominal pain. Three ER visits for similar symptoms. Ultimately found to have appendicitis. Upper abdominal pain has improved after appendectomy, but the the left sided abdominal pain persists. Pain is constant. Worse with constipation. Using OTC dulcolax for constipation. No reflux. Associated bloating. No change with eating or movement. There is no blood or mucus in the stool.  EGD in [place] 2 years ago for similar symptoms.  Most recent abdominal imaging: CT of the abdomen and pelvis with contrast 03/23/20: nonobstructing punctate renal calculi, interval appendectomy, stable borderline splenomegaly, no acute process  Labs 09/15/20: normal BMP except glucose 107, hemoglobin 13.9, platelets 427  Maternal grandmother and grandfather with esophageal cancer. No known family history of colon cancer or polyps. No family history of uterine/endometrial cancer, pancreatic cancer or gastric/stomach cancer.    Past Medical History:  Diagnosis Date    Alopecia    Anal fistula    Anxiety and depression 03/07/2014   COVID    GERD (gastroesophageal reflux disease) 02/14/2014   Goiter 03/07/2014   Headache(784.0)    Hemorrhoids    Kidney stone    Pseudotumor cerebri     Past Surgical History:  Procedure Laterality Date   CHOLECYSTECTOMY  2004   kidney stent: 12/2018 at [place]     LAPAROSCOPIC APPENDECTOMY N/A 03/05/2020   Procedure: APPENDECTOMY LAPAROSCOPIC;  Surgeon: Quentin Ore, MD;  Location: [facility];  Service: General;  Laterality: N/A;    Current Outpatient Medications  Medication Sig Dispense Refill   acetaminophen (TYLENOL) 500 MG tablet You can take 2 tablets every 6 hours as needed for pain.  You can alternate with plain ibuprofen.  You can buy this over-the-counter at any drugstore.  Do not take more than 4000 mg of Tylenol/acetaminophen per day, it can harm your liver. (Patient taking differently: Take 1,000 mg by mouth every 6 (six) hours as needed (pain). Do not take more than 4000 mg of Tylenol/acetaminophen per day, it can harm your liver.)     acetaZOLAMIDE (DIAMOX) 250 MG tablet Take 2 tablets (500 mg total) by mouth daily. 60 tablet 3   amitriptyline (ELAVIL) 25 MG tablet Take 1 tablet (25 mg total) by mouth at bedtime. 30 tablet 1   Ascorbic Acid (VITAMIN C PO) Take 1 tablet by mouth daily.     aspirin-acetaminophen-caffeine (EXCEDRIN MIGRAINE) 250-250-65 MG tablet Take 2 tablets by mouth daily as needed for migraine.     azithromycin (ZITHROMAX Z-PAK) 250 MG tablet As directed 6 each 0   B Complex Vitamins (VITAMIN B COMPLEX) TABS Take 1 tablet  by mouth daily.     chlorpheniramine-HYDROcodone (TUSSIONEX PENNKINETIC ER) 10-8 MG/5ML SUER Take 5 mLs by mouth every 12 (twelve) hours as needed for cough. 115 mL 0   hyoscyamine (LEVSIN SL) 0.125 MG SL tablet Take 1 tablet (0.125 mg total) by mouth every 4 (four) hours as needed for cramping (stomach pain). 30 tablet 3   ketoconazole (NIZORAL) 2 % cream Apply  topically daily as needed for irritation. (Patient taking differently: Apply 1 application topically daily as needed for irritation.) 30 g 0   linaclotide (LINZESS) 145 MCG CAPS capsule Take 1 capsule (145 mcg total) by mouth daily before breakfast. 30 capsule 0   Multiple Vitamins-Minerals (ZINC PO) Take 1 tablet by mouth daily.     pantoprazole (PROTONIX) 40 MG tablet Take 1 tablet (40 mg total) by mouth every morning. 90 tablet 1   predniSONE (DELTASONE) 10 MG tablet TAKE 3 TABLETS PO QD FOR 3 DAYS THEN TAKE 2 TABLETS PO QD FOR 3 DAYS THEN TAKE 1 TABLET PO QD FOR 3 DAYS THEN TAKE 1/2 TAB PO QD FOR 3 DAYS 20 tablet 0   sertraline (ZOLOFT) 100 MG tablet Take 1.5 tablets (150 mg total) by mouth daily. (Patient taking differently: Take 150 mg by mouth at bedtime.) 135 tablet 1   Vitamin D, Ergocalciferol, (DRISDOL) 1.25 MG (50000 UNIT) CAPS capsule Take 1 capsule (50,000 Units total) by mouth every 7 (seven) days. (Patient taking differently: Take 50,000 Units by mouth every Thursday.) 12 capsule 0   No current facility-administered medications for this visit.    Allergies as of 10/21/2020   (No Known Allergies)    Family History  Problem Relation Age of Onset   Diabetes Mother    Hypertension Mother    Esophageal cancer Maternal Grandmother    Esophageal cancer Maternal Grandfather    Diabetes Maternal Grandfather    Colon cancer Neg Hx    Colon polyps Neg Hx    Gallbladder disease Neg Hx    Kidney disease Neg Hx      Physical Exam: General:   Alert,  well-nourished, pleasant and cooperative in NAD Head:  Normocephalic and atraumatic. Eyes:  Sclera clear, no icterus.   Conjunctiva pink. Abdomen:  Soft, I am unable to reproduce her pain which she localized to the left rib/flank down the right flank, nondistended, normal bowel sounds, no rebound or guarding. No hepatosplenomegaly.   Neurologic:  Alert and  oriented x4;  grossly nonfocal Skin:  Intact without significant lesions or  rashes. Psych:  Alert and cooperative. Normal mood and affect.    Lorilyn Laitinen L. Orvan Falconer, MD, MPH 10/21/2020, 12:39 PM

## 2020-10-21 NOTE — Progress Notes (Signed)
Pt's states no medical or surgical changes since previsit or office visit. VS assessed by D.T 

## 2020-10-21 NOTE — Op Note (Addendum)
[facility] Patient Name: Stephanie Davies Procedure Date: 10/21/2020 1:19 PM MRN: [id number] Endoscopist: Tressia Danas MD, MD Age: 45 Referring MD:  Date of Birth: 01-30-1975 Gender: Female Account #: [id number] Procedure:                Upper GI endoscopy Indications:              Left-sided abdominal pain, Abdominal bloating Medicines:                Monitored Anesthesia Care Procedure:                Pre-Anesthesia Assessment:                           - Prior to the procedure, a History and Physical                            was performed, and patient medications and                            allergies were reviewed. The patient's tolerance of                            previous anesthesia was also reviewed. The risks                            and benefits of the procedure and the sedation                            options and risks were discussed with the patient.                            All questions were answered, and informed consent                            was obtained. Prior Anticoagulants: The patient has                            taken no previous anticoagulant or antiplatelet                            agents. ASA Grade Assessment: III - A patient with                            severe systemic disease. After reviewing the risks                            and benefits, the patient was deemed in                            satisfactory condition to undergo the procedure.                           After obtaining informed consent, the endoscope was  passed under direct vision. Throughout the                            procedure, the patient's blood pressure, pulse, and                            oxygen saturations were monitored continuously. The                            Endoscope was introduced through the mouth, and                            advanced to the third part of duodenum. The upper                            GI  endoscopy was accomplished without difficulty.                            The patient tolerated the procedure well. Scope In: Scope Out: Findings:                 The examined esophagus was normal.                           Three non-bleeding superficial gastric ulcers with                            no stigmata of bleeding were found in the gastric                            antrum. There is mild gastritis in the body and                            scattered erosions in the antrum. Biopsies were                            taken from the ulcer margins, antrum, body, and                            fundus with a cold forceps for histology.                           Patchy mildly erythematous mucosa was found in the                            duodenal bulb.                           The exam was otherwise without abnormality. Complications:            No immediate complications. Estimated blood loss:                            Minimal. Estimated Blood Loss:     Estimated blood loss was minimal. Impression:               -  Normal esophagus.                           - Non-bleeding gastric ulcers with no stigmata of                            bleeding. Biopsied.                           - Erythematous duodenopathy.                           - The examination was otherwise normal. Recommendation:           - Patient has a contact number available for                            emergencies. The signs and symptoms of potential                            delayed complications were discussed with the                            patient. Return to normal activities tomorrow.                            Written discharge instructions were provided to the                            patient.                           - Resume previous diet.                           - Continue present medications. Increase                            pantoprazole to 40 mg twice daily for at least 10                             weeks, then reduce to 40 mg daily.                           - Await pathology results.                           - Avoid all NSAIDs.                           - Talk to your headache doctors about alternative                            ways to manage your headaches so that you can avoid                            Excedrin.                           -  Consider repeat EGD in 12 weeks to document                            healing. Tressia Danas MD, MD 10/21/2020 2:06:55 PM This report has been signed electronically.

## 2020-10-21 NOTE — Progress Notes (Signed)
1334 Data will not transfer from monitor, vs being posted manually.

## 2020-10-21 NOTE — Progress Notes (Signed)
Report given to PACU, vss 

## 2020-10-21 NOTE — Progress Notes (Signed)
Called to room to assist during endoscopic procedure.  Patient ID and intended procedure confirmed with present staff. Received instructions for my participation in the procedure from the performing physician.  

## 2020-10-21 NOTE — Progress Notes (Signed)
1334 Robinul 0.1 mg IV given due large amount of secretions upon assessment.  MD made aware, vss  

## 2020-10-21 NOTE — Op Note (Addendum)
[facility] Patient Name: Stephanie Davies Procedure Date: 10/21/2020 1:19 PM MRN: [id number] Endoscopist: Tressia Danas MD, MD Age: 45 Referring MD:  Date of Birth: 13-Jun-1975 Gender: Female Account #: [id number] Procedure:                Colonoscopy Indications:              Left-sdied abdominal pain Medicines:                Monitored Anesthesia Care Procedure:                Pre-Anesthesia Assessment:                           - Prior to the procedure, a History and Physical                            was performed, and patient medications and                            allergies were reviewed. The patient's tolerance of                            previous anesthesia was also reviewed. The risks                            and benefits of the procedure and the sedation                            options and risks were discussed with the patient.                            All questions were answered, and informed consent                            was obtained. Prior Anticoagulants: The patient has                            taken no previous anticoagulant or antiplatelet                            agents. ASA Grade Assessment: III - A patient with                            severe systemic disease. After reviewing the risks                            and benefits, the patient was deemed in                            satisfactory condition to undergo the procedure.                           After obtaining informed consent, the colonoscope  was passed under direct vision. Throughout the                            procedure, the patient's blood pressure, pulse, and                            oxygen saturations were monitored continuously. The                            CF [id number] #[id number] was introduced through the anus                            and advanced to the 3 cm into the ileum. A second                            forward view of the right  colon was performed. The                            colonoscopy was performed without difficulty. The                            patient tolerated the procedure well. The quality                            of the bowel preparation was good. The terminal                            ileum, ileocecal valve, appendiceal orifice, and                            rectum were photographed. Scope In: 1:47:46 PM Scope Out: 2:02:12 PM Scope Withdrawal Time: 0 hours 11 minutes 42 seconds  Total Procedure Duration: 0 hours 14 minutes 26 seconds  Findings:                 The perianal and digital rectal examinations were                            normal.                           Non-bleeding internal hemorrhoids were found.                           The entire examined colon appeared normal on direct                            and retroflexion views. Complications:            No immediate complications. Estimated Blood Loss:     Estimated blood loss: none. Impression:               - Non-bleeding internal hemorrhoids.                           - The entire examined colon  is normal on direct and                            retroflexion views.                           - No specimens collected. Recommendation:           - Patient has a contact number available for                            emergencies. The signs and symptoms of potential                            delayed complications were discussed with the                            patient. Return to normal activities tomorrow.                            Written discharge instructions were provided to the                            patient.                           - Resume previous diet.                           - Continue present medications.                           - Repeat colonoscopy in 10 years for screening                            purposes, earlier with new symptoms.                           - Emerging evidence supports eating a  diet of                            fruits, vegetables, grains, calcium, and yogurt                            while reducing red meat and alcohol may reduce the                            risk of colon cancer. Tressia Danas MD, MD 10/21/2020 2:10:12 PM This report has been signed electronically.

## 2020-10-23 ENCOUNTER — Telehealth: Payer: Self-pay | Admitting: *Deleted

## 2020-10-23 DIAGNOSIS — H5213 Myopia, bilateral: Secondary | ICD-10-CM | POA: Diagnosis not present

## 2020-10-23 NOTE — Telephone Encounter (Signed)
  Follow up Call-  Call back number 10/21/2020  Post procedure Call Back phone  # [phone]  Permission to leave phone message Yes  Some recent data might be hidden     Patient questions:  Do you have a fever, pain , or abdominal swelling? No. Pain Score  0 *  Have you tolerated food without any problems? Yes.    Have you been able to return to your normal activities? Yes.    Do you have any questions about your discharge instructions: Diet   No. Medications  No. Follow up visit  No.  Do you have questions or concerns about your Care? No.  Actions: * If pain score is 4 or above: No action needed, pain <4.   Have you developed a fever since your procedure? no  2.   Have you had an respiratory symptoms (SOB or cough) since your procedure? no  3.   Have you tested positive for COVID 19 since your procedure no  4.   Have you had any family members/close contacts diagnosed with the COVID 19 since your procedure?  no   If yes to any of these questions please route to Laverna Peace, RN and Karlton Lemon, RN

## 2020-10-23 NOTE — Telephone Encounter (Signed)
Attempted to call patient for their post-procedure follow-up call. No answer. Left voicemail.   

## 2020-11-05 ENCOUNTER — Telehealth: Payer: Self-pay | Admitting: Gastroenterology

## 2020-11-05 NOTE — Telephone Encounter (Signed)
Patient called requesting a call regarding her pathology results.  Thank you.

## 2020-11-05 NOTE — Telephone Encounter (Signed)
Addressed in previously created encounter 

## 2020-11-27 DIAGNOSIS — G4733 Obstructive sleep apnea (adult) (pediatric): Secondary | ICD-10-CM | POA: Diagnosis not present

## 2020-12-03 ENCOUNTER — Telehealth: Payer: Self-pay | Admitting: Family Medicine

## 2020-12-03 NOTE — Telephone Encounter (Signed)
Pt scheduled for Initial CPAP visit on 01/20/21 at 12:45p.with Margie Ege, NP. Pt was informed to bring machine and power cord to appt. DME: [employer] Phone: [phone] Fax: [phone] Equipment issued: Resmed F20 Pt to be schedule between: 12/28/20-02/27/21

## 2020-12-15 ENCOUNTER — Other Ambulatory Visit: Payer: Self-pay | Admitting: Internal Medicine

## 2020-12-16 ENCOUNTER — Ambulatory Visit: Payer: Medicaid Other | Admitting: Gastroenterology

## 2020-12-27 DIAGNOSIS — G4733 Obstructive sleep apnea (adult) (pediatric): Secondary | ICD-10-CM | POA: Diagnosis not present

## 2021-01-05 ENCOUNTER — Other Ambulatory Visit: Payer: Self-pay | Admitting: Family Medicine

## 2021-01-05 ENCOUNTER — Encounter: Payer: Self-pay | Admitting: Family Medicine

## 2021-01-05 DIAGNOSIS — J40 Bronchitis, not specified as acute or chronic: Secondary | ICD-10-CM

## 2021-01-15 ENCOUNTER — Ambulatory Visit: Payer: Medicaid Other | Admitting: Internal Medicine

## 2021-01-15 ENCOUNTER — Encounter: Payer: Self-pay | Admitting: Internal Medicine

## 2021-01-15 VITALS — BP 116/66 | HR 77 | Temp 98.3°F | Resp 18 | Ht 65.0 in | Wt 287.4 lb

## 2021-01-15 DIAGNOSIS — R051 Acute cough: Secondary | ICD-10-CM

## 2021-01-15 MED ORDER — BENZONATATE 200 MG PO CAPS: 200.0000 mg | ORAL_CAPSULE | Freq: Three times a day (TID) | ORAL | 0 refills | Status: DC | PRN

## 2021-01-15 NOTE — Patient Instructions (Addendum)
Please schedule a physical exam at your earliest convenience.     For cough:  Take Mucinex DM or Robitussin-DM OTC.  Follow the instructions in the box. You can also use Tessalon Perles, I sent a prescription   For nasal congestion: -Use over-the-counter Flonase: 2 nasal sprays on each side of the nose in the morning until you feel better  -Use OTC Astepro 2 nasal sprays on each side of the nose twice daily until better  Start taking Allegra 60 mg over-the-counter twice daily as needed for allergies.

## 2021-01-15 NOTE — Progress Notes (Signed)
Subjective:    Patient ID: Stephanie Davies, female    DOB: [birth year], 46 y.o.   MRN: [id number]  DOS:  01/15/2021 Type of visit - description: acute, here with her husband  Symptoms a started 2 weeks ago: Sore throat described as "sand in the throat" creating coughing spells. Symptoms are worse at night. Occasional yellowish sputum production  Review of Systems Denies fever chills.  admits to sneezing. + Runny nose and postnasal dripping. Blowing clear discharge from the nose. GERD symptoms controlled with medications  Past Medical History:  Diagnosis Date   Alopecia    Anal fistula    Anxiety and depression 03/07/2014   COVID    GERD (gastroesophageal reflux disease) 02/14/2014   Goiter 03/07/2014   Headache(784.0)    Hemorrhoids    Kidney stone    Pseudotumor cerebri     Past Surgical History:  Procedure Laterality Date   CHOLECYSTECTOMY  2004   kidney stent: 12/2018 at [facility]     LAPAROSCOPIC APPENDECTOMY N/A 03/05/2020   Procedure: APPENDECTOMY LAPAROSCOPIC;  Surgeon: Quentin Ore, MD;  Location: [facility];  Service: General;  Laterality: N/A;    Allergies as of 01/15/2021   No Known Allergies      Medication List        Accurate as of January 15, 2021 11:59 PM. If you have any questions, ask your nurse or doctor.          STOP taking these medications    acetaminophen 500 MG tablet Commonly known as: TYLENOL Stopped by: Willow Ora, MD   acetaZOLAMIDE 250 MG tablet Commonly known as: DIAMOX Stopped by: Willow Ora, MD   amitriptyline 25 MG tablet Commonly known as: ELAVIL Stopped by: Willow Ora, MD   aspirin-acetaminophen-caffeine [phone] MG tablet Commonly known as: EXCEDRIN MIGRAINE Stopped by: Willow Ora, MD   azithromycin 250 MG tablet Commonly known as: Zithromax Z-Pak Stopped by: Willow Ora, MD   chlorpheniramine-HYDROcodone 10-8 MG/5ML Suer Commonly known as: Tussionex Pennkinetic ER Stopped by: Willow Ora, MD   hyoscyamine 0.125 MG  SL tablet Commonly known as: LEVSIN SL Stopped by: Willow Ora, MD   ketoconazole 2 % cream Commonly known as: NIZORAL Stopped by: Willow Ora, MD   linaclotide 145 MCG Caps capsule Commonly known as: Linzess Stopped by: Willow Ora, MD   predniSONE 10 MG tablet Commonly known as: DELTASONE Stopped by: Willow Ora, MD   Vitamin B Complex Tabs Stopped by: Willow Ora, MD   ZINC PO Stopped by: Willow Ora, MD       TAKE these medications    benzonatate 200 MG capsule Commonly known as: TESSALON Take 1 capsule (200 mg total) by mouth 3 (three) times daily as needed for cough. Started by: Willow Ora, MD   pantoprazole 40 MG tablet Commonly known as: PROTONIX Increase to 40 mg twice daily for 10 weeks then reduce to 40 mg once daily.   sertraline 100 MG tablet Commonly known as: ZOLOFT TAKE 1 & 1/2 (ONE & ONE-HALF) TABLETS BY MOUTH ONCE DAILY   VITAMIN C PO Take 1 tablet by mouth daily.   Vitamin D (Ergocalciferol) 1.25 MG (50000 UNIT) Caps capsule Commonly known as: DRISDOL Take 1 capsule (50,000 Units total) by mouth every 7 (seven) days.           Objective:   Physical Exam BP 116/66 (BP Location: Left Arm, Patient Position: Sitting, Cuff Size: Normal)    Pulse 77    Temp 98.3 F (36.8 C) (  Oral)    Resp 18    Ht 5\' 5"  (1.651 m)    Wt 287 lb 6 oz (130.4 kg)    LMP 12/19/2020 (Exact Date)    SpO2 97%    BMI 47.82 kg/m  General:   Well developed, NAD, BMI noted. HEENT:  Normocephalic . Face symmetric, atraumatic. Throat: Symmetric, not red. Nose: Slightly congested Lungs:  CTA B.  No wheezing, rhonchi Normal respiratory effort, no intercostal retractions, no accessory muscle use. Heart: RRR,  no murmur.  Lower extremities: no pretibial edema bilaterally  Skin: Not pale. Not jaundice Neurologic:  alert & oriented X3.  Speech normal, gait appropriate for age and unassisted Psych--  Cognition and judgment appear intact.  Cooperative with normal attention span and  concentration.  Behavior appropriate. No anxious or depressed appearing.      Assessment    ASSESSMENT Anxiety, depression pseudotumor cerebri, headaches Goiter : [place] 03-2014, bx  Done 11-2015 IBS Kidney stones, kidney stent R 12/2018 renal cysts found on a renal ultrasound in [place] Goiter: Biopsy 08/23/2019, [facility] category 2 benign Appendicitis 10-2019, nonsurgical approach. OSA: Rx CPAP Headaches: See neurology note 06/03/2020   PLAN: Cough: As described above, possibly triggered by URI or allergies. No evidence of bacterial infection, recommend conservative treatment with Mucinex DM, Tessalon Perles, Flonase, Astepro and Allegra. History of left-sided abdominal pain: Saw GI 09/2020  11-2020: Normal colonoscopy.  EGD showed a nonbleeding gastric ulcer.  BX no HP, Rx PPIs Thyroid nodule: Last ultrasound 06/2020, no worrisome new thyroid nodules, was referred to Endo (not pursue so far) Recommend RTC at her earliest convenience to discuss CPX and other medical issues    This visit occurred during the SARS-CoV-2 public health emergency.  Safety protocols were in place, including screening questions prior to the visit, additional usage of staff PPE, and extensive cleaning of exam room while observing appropriate contact time as indicated for disinfecting solutions.

## 2021-01-16 NOTE — Assessment & Plan Note (Signed)
Cough: As described above, possibly triggered by URI or allergies. No evidence of bacterial infection, recommend conservative treatment with Mucinex DM, Tessalon Perles, Flonase, Astepro and Allegra. History of left-sided abdominal pain: Saw GI 09/2020  11-2020: Normal colonoscopy.  EGD showed a nonbleeding gastric ulcer.  BX no HP, Rx PPIs Thyroid nodule: Last ultrasound 06/2020, no worrisome new thyroid nodules, was referred to Endo (not pursue so far) Recommend RTC at her earliest convenience to discuss CPX and other medical issues

## 2021-01-20 ENCOUNTER — Encounter: Payer: Self-pay | Admitting: Neurology

## 2021-01-20 ENCOUNTER — Ambulatory Visit: Payer: Medicaid Other | Admitting: Neurology

## 2021-01-30 ENCOUNTER — Ambulatory Visit: Payer: Medicaid Other | Admitting: Nurse Practitioner

## 2021-02-01 ENCOUNTER — Encounter: Payer: Self-pay | Admitting: Internal Medicine

## 2021-02-09 ENCOUNTER — Encounter: Payer: Self-pay | Admitting: Internal Medicine

## 2021-02-09 ENCOUNTER — Ambulatory Visit: Payer: Medicaid Other | Admitting: Internal Medicine

## 2021-02-09 VITALS — BP 126/74 | HR 84 | Temp 98.3°F | Resp 18 | Ht 65.0 in | Wt 287.2 lb

## 2021-02-09 DIAGNOSIS — R051 Acute cough: Secondary | ICD-10-CM

## 2021-02-09 DIAGNOSIS — K219 Gastro-esophageal reflux disease without esophagitis: Secondary | ICD-10-CM

## 2021-02-09 MED ORDER — BENZONATATE 200 MG PO CAPS: 200.0000 mg | ORAL_CAPSULE | Freq: Three times a day (TID) | ORAL | 0 refills | Status: DC | PRN

## 2021-02-09 MED ORDER — AZITHROMYCIN 250 MG PO TABS: ORAL_TABLET | ORAL | 0 refills | Status: DC

## 2021-02-09 NOTE — Progress Notes (Signed)
Subjective:    Patient ID: Stephanie Davies, female    DOB: 12-Mar-1975, 46 y.o.   MRN: [id number]  DOS:  02/09/2021 Type of visit - description: acute  Was seen at this office 01/15/2021, at the time she was coughing for 2 weeks. She did very well with Tessalon Perles and nasal sprays. On 02/02/2021 her daughter got a URI, was taken to the pediatrician, diagnosed with a virus. Since then, Stephanie Davies is coughing again: Cough is persisting, worse at night, associated with some yellowish sputum and she has seen drops of blood in the mucus mostly in the mornings.  No fever chills No chest pain no difficulty breathing Some runny nose and clear nasal discharge GERD well-controlled with PPI twice daily. She is exposed to some smoke at home.  Wt Readings from Last 3 Encounters:  02/09/21 287 lb 4 oz (130.3 kg)  01/15/21 287 lb 6 oz (130.4 kg)  10/21/20 278 lb (126.1 kg)      Review of Systems See above   Past Medical History:  Diagnosis Date   Alopecia    Anal fistula    Anxiety and depression 03/07/2014   COVID    GERD (gastroesophageal reflux disease) 02/14/2014   Goiter 03/07/2014   Headache(784.0)    Hemorrhoids    Kidney stone    Pseudotumor cerebri     Past Surgical History:  Procedure Laterality Date   CHOLECYSTECTOMY  2004   kidney stent: 12/2018 at [place]     LAPAROSCOPIC APPENDECTOMY N/A 03/05/2020   Procedure: APPENDECTOMY LAPAROSCOPIC;  Surgeon: Quentin Ore, MD;  Location: [facility];  Service: General;  Laterality: N/A;    Current Outpatient Medications  Medication Instructions   Ascorbic Acid (VITAMIN C PO) 1 tablet, Daily   benzonatate (TESSALON) 200 mg, Oral, 3 times daily PRN   pantoprazole (PROTONIX) 40 MG tablet Increase to 40 mg twice daily for 10 weeks then reduce to 40 mg once daily.   sertraline (ZOLOFT) 100 MG tablet TAKE 1 & 1/2 (ONE & ONE-HALF) TABLETS BY MOUTH ONCE DAILY   Vitamin D (Ergocalciferol) (DRISDOL) 50,000 Units, Oral, Every 7 days        Objective:   Physical Exam BP 126/74 (BP Location: Right Arm, Patient Position: Sitting, Cuff Size: Normal)    Pulse 84    Temp 98.3 F (36.8 C) (Oral)    Resp 18    Ht 5\' 5"  (1.651 m)    Wt 287 lb 4 oz (130.3 kg)    LMP 01/16/2021    SpO2 97%    BMI 47.80 kg/m  General:   Well developed, NAD, BMI noted. HEENT:  Normocephalic . Face symmetric, atraumatic. TMs normal.  Throat symmetric, not red Lungs:  Few rhonchi with cough, no wheezing, frequent cough noted Normal respiratory effort, no intercostal retractions, no accessory muscle use. Heart: RRR,  no murmur.  Lower extremities: no pretibial edema bilaterally  Skin: Not pale. Not jaundice Neurologic:  alert & oriented X3.  Speech normal, gait appropriate for age and unassisted Psych--  Cognition and judgment appear intact.  Cooperative with normal attention span and concentration.  Behavior appropriate. No anxious or depressed appearing.      Assessment     ASSESSMENT Anxiety, depression pseudotumor cerebri, headaches Goiter : [place] 03-2014, bx  Done 11-2015 IBS Kidney stones, kidney stent R 12/2018 renal cysts found on a renal ultrasound in [place] Goiter: Biopsy 08/23/2019, [facility] category 2 benign OSA: Rx CPAP Headaches: See neurology note 06/03/2020 Recurrent cough (  Saw Dr. Lottie Dawson, pulmonology- 2016, dx  asthma unlikely, upper airway cough syndrome GERD/sinusitis/throat clearing)  PLAN: Cough:  After last visit she got better with conservative treatment but was exposed to URI and symptoms returned. Few rhonchi with cough, reports drops of blood in the mucus in the morning but no fever chills or chest pain.  On chart review, was seen by pulmonary in 2016, Dx upper airway cough syndrome. Plan: Continue Mucinex, refill Tessalon Perles, continue Astepro, Flonase, Allegra.  Add Zithromax, intercurrent b-chitis?.  Call if not better Exposure to fumes: Brother smoke indoors, due to religious reasons they use incense at  home (pt reports incense  does trigger cough): Strongly recommend avoidance. GERD: Well-controlled  This visit occurred during the SARS-CoV-2 public health emergency.  Safety protocols were in place, including screening questions prior to the visit, additional usage of staff PPE, and extensive cleaning of exam room while observing appropriate contact time as indicated for disinfecting solutions.

## 2021-02-09 NOTE — Patient Instructions (Signed)
Cough control: - Mucinex DM - Tessalon Perles   Continue nose sprays: astepro, Flonase  Continue Allegra over-the-counter  Zithromax and antibiotic for few days  Avoid tobacco and incense exposure  Call if fever, chills or you continue to see drops of blood in the mucus  Call if not gradually better in the next 2 weeks

## 2021-02-09 NOTE — Assessment & Plan Note (Signed)
Cough:  After last visit she got better with conservative treatment but was exposed to URI and symptoms returned. Few rhonchi with cough, reports drops of blood in the mucus in the morning but no fever chills or chest pain.  On chart review, was seen by pulmonary in 2016, Dx upper airway cough syndrome. Plan: Continue Mucinex, refill Tessalon Perles, continue Astepro, Flonase, Allegra.  Add Zithromax, intercurrent b-chitis?.  Call if not better Exposure to fumes: Brother smoke indoors, due to religious reasons they use incense at home (pt reports incense  does trigger cough): Strongly recommend avoidance. GERD: Well-controlled

## 2021-02-19 ENCOUNTER — Encounter: Payer: Self-pay | Admitting: Internal Medicine

## 2021-03-02 ENCOUNTER — Encounter: Payer: Self-pay | Admitting: Internal Medicine

## 2021-03-06 ENCOUNTER — Encounter: Payer: Self-pay | Admitting: Internal Medicine

## 2021-03-06 ENCOUNTER — Ambulatory Visit: Payer: Medicaid Other | Admitting: Internal Medicine

## 2021-03-06 VITALS — BP 126/66 | HR 93 | Temp 98.3°F | Resp 18 | Ht 65.0 in | Wt 285.5 lb

## 2021-03-06 DIAGNOSIS — R109 Unspecified abdominal pain: Secondary | ICD-10-CM | POA: Diagnosis not present

## 2021-03-06 DIAGNOSIS — D72829 Elevated white blood cell count, unspecified: Secondary | ICD-10-CM

## 2021-03-06 LAB — POC URINALSYSI DIPSTICK (AUTOMATED)
Bilirubin, UA: NEGATIVE
Blood, UA: NEGATIVE
Glucose, UA: NEGATIVE
Ketones, UA: NEGATIVE
Leukocytes, UA: NEGATIVE
Nitrite, UA: NEGATIVE
Protein, UA: NEGATIVE
Spec Grav, UA: 1.02 (ref 1.010–1.025)
Urobilinogen, UA: 0.2 E.U./dL
pH, UA: 6 (ref 5.0–8.0)

## 2021-03-06 LAB — URINALYSIS, ROUTINE W REFLEX MICROSCOPIC
Bilirubin Urine: NEGATIVE
Hgb urine dipstick: NEGATIVE
Ketones, ur: NEGATIVE
Nitrite: NEGATIVE
Specific Gravity, Urine: 1.025 (ref 1.000–1.030)
Total Protein, Urine: NEGATIVE
Urine Glucose: NEGATIVE
Urobilinogen, UA: 0.2 (ref 0.0–1.0)
pH: 5.5 (ref 5.0–8.0)

## 2021-03-06 LAB — POCT URINE PREGNANCY: Preg Test, Ur: NEGATIVE

## 2021-03-06 NOTE — Progress Notes (Signed)
Subjective:    Patient ID: Stephanie Davies, female    DOB: [birth year], 46 y.o.   MRN: [id number]  DOS:  03/06/2021 Type of visit - description: Acute  1 month history of pain at the left flank and left upper quadrant. Multiple episodes a day, each episode last few minutes, described as dull aches. Does not increase with food intake or moving her torso.  Admits to some dysuria but no gross hematuria. Has thoracolumbar spine pain, chronic issue.  Review of Systems See above   Past Medical History:  Diagnosis Date   Alopecia    Anal fistula    Anxiety and depression 03/07/2014   COVID    GERD (gastroesophageal reflux disease) 02/14/2014   Goiter 03/07/2014   Headache(784.0)    Hemorrhoids    Kidney stone    Pseudotumor cerebri     Past Surgical History:  Procedure Laterality Date   CHOLECYSTECTOMY  2004   kidney stent: 12/2018 at [place] N/A 03/05/2020   Procedure: APPENDECTOMY LAPAROSCOPIC;  Surgeon: Felicie Morn, MD;  Location: [facility];  Service: General;  Laterality: N/A;    Current Outpatient Medications  Medication Instructions   azithromycin (ZITHROMAX Z-PAK) 250 MG tablet 2 tabs a day the first day, then 1 tab a day x 4 days   benzonatate (TESSALON) 200 mg, Oral, 3 times daily PRN   pantoprazole (PROTONIX) 40 MG tablet Increase to 40 mg twice daily for 10 weeks then reduce to 40 mg once daily.   sertraline (ZOLOFT) 100 MG tablet TAKE 1 & 1/2 (ONE & ONE-HALF) TABLETS BY MOUTH ONCE DAILY       Objective:   Physical Exam BP 126/66 (BP Location: Left Arm, Patient Position: Sitting, Cuff Size: Normal)    Pulse 93    Temp 98.3 F (36.8 C) (Oral)    Resp 18    Ht 5\' 5"  (1.651 m)    Wt 285 lb 8 oz (129.5 kg)    LMP 02/17/2021 (Exact Date)    SpO2 97%    BMI 47.51 kg/m  General:   Well developed, NAD, BMI noted.  HEENT:  Normocephalic . Face symmetric, atraumatic Abdomen:  Not distended, soft, slightly tender at the left upper  quadrant.  No mass no rebound.  Unable to see if there is any organomegaly due to BMI. Question of CVA tenderness on the left. Skin: No rash at the abdomen, flank or back Lower extremities: no pretibial edema bilaterally  Neurologic:  alert & oriented X3.  Speech normal, gait appropriate for age and unassisted Psych--  Cognition and judgment appear intact.  Cooperative with normal attention span and concentration.  Behavior appropriate. No anxious or depressed appearing.     Assessment    ASSESSMENT Anxiety, depression pseudotumor cerebri, headaches Goiter : [place] 03-2014, bx  Done 11-2015 IBS Kidney stones, kidney stent R 12/2018 renal cysts found on a renal ultrasound in [place] Goiter: Biopsy 08/23/2019, [facility] category 2 benign OSA: Rx CPAP Headaches: See neurology note 06/03/2020 Recurrent cough (Saw Dr. Lottie Dawson, pulmonology- 2016, dx  asthma unlikely, upper airway cough syndrome GERD/sinusitis/throat clearing)  PLAN: Left sided abdominal pain/flank pain: This has been a chronic issue. On chart review, she had R kidney stones, stent in 2020 - while living in [place].  At the time had an abnormal CT. Subsequently had   abdominal and pelvic CTs, last was 03-2020:Nonobstructing L renal calculi, borderline splenomegaly. Saw GI 10/07/2020 d/t  chronic left-sided abdominal pain  with bloating, chronic constipation, upper abdominal pain.  Colonoscopy and upper endoscopy 10-2020.  No H. pylori, next colonoscopy 10 years W/u today: Udip negative. UPT negative. Plan: UA urine culture, renal ultrasound (history of kidney stones), further advised with results noting L sided abd pain has been a  chronic/ongoing issue. Chronic leukocytosis and thrombocytosis: Recheck, if still elevated will refer to hematology (noting mild splenomegaly per CT).    This visit occurred during the SARS-CoV-2 public health emergency.  Safety protocols were in place, including screening questions prior to the visit,  additional usage of staff PPE, and extensive cleaning of exam room while observing appropriate contact time as indicated for disinfecting solutions.

## 2021-03-06 NOTE — Patient Instructions (Signed)
° °  GO TO THE LAB : Get the blood work      STOP BY THE FIRST FLOOR:  get scheduled renal ultrasound

## 2021-03-07 ENCOUNTER — Other Ambulatory Visit: Payer: Self-pay

## 2021-03-07 ENCOUNTER — Ambulatory Visit (HOSPITAL_BASED_OUTPATIENT_CLINIC_OR_DEPARTMENT_OTHER)
Admission: RE | Admit: 2021-03-07 | Discharge: 2021-03-07 | Disposition: A | Discharge status: Home / Self Care | Payer: Medicaid Other | Source: Ambulatory Visit | Attending: Internal Medicine | Admitting: Internal Medicine

## 2021-03-07 DIAGNOSIS — R109 Unspecified abdominal pain: Secondary | ICD-10-CM | POA: Diagnosis not present

## 2021-03-07 DIAGNOSIS — N281 Cyst of kidney, acquired: Secondary | ICD-10-CM | POA: Diagnosis not present

## 2021-03-07 DIAGNOSIS — R300 Dysuria: Secondary | ICD-10-CM | POA: Diagnosis not present

## 2021-03-07 LAB — URINE CULTURE
MICRO NUMBER:: 13054342
SPECIMEN QUALITY:: ADEQUATE

## 2021-03-07 NOTE — Assessment & Plan Note (Signed)
Left sided abdominal pain/flank pain: This has been a chronic issue. On chart review, she had R kidney stones, stent in 2020 - while living in [place].  At the time had an abnormal CT. Subsequently had   abdominal and pelvic CTs, last was 03-2020:Nonobstructing L renal calculi, borderline splenomegaly. Saw GI 10/07/2020 d/t  chronic left-sided abdominal pain with bloating, chronic constipation, upper abdominal pain.  Colonoscopy and upper endoscopy 10-2020.  No H. pylori, next colonoscopy 10 years W/u today: Udip negative. UPT negative. Plan: UA urine culture, renal ultrasound (history of kidney stones), further advised with results noting L sided abd pain has been a  chronic/ongoing issue. Chronic leukocytosis and thrombocytosis: Recheck, if still elevated will refer to hematology (noting mild splenomegaly per CT).

## 2021-03-09 LAB — CBC WITH DIFFERENTIAL/PLATELET
Basophils Absolute: 0 10*3/uL (ref 0.0–0.1)
Basophils Relative: 0.5 % (ref 0.0–3.0)
Eosinophils Absolute: 0.2 10*3/uL (ref 0.0–0.7)
Eosinophils Relative: 2.2 % (ref 0.0–5.0)
HCT: 38.1 % (ref 36.0–46.0)
Hemoglobin: 12.3 g/dL (ref 12.0–15.0)
Lymphocytes Relative: 15.9 % (ref 12.0–46.0)
Lymphs Abs: 1.5 10*3/uL (ref 0.7–4.0)
MCHC: 32.1 g/dL (ref 30.0–36.0)
MCV: 76.8 fl — ABNORMAL LOW (ref 78.0–100.0)
Monocytes Absolute: 0.4 10*3/uL (ref 0.1–1.0)
Monocytes Relative: 4.5 % (ref 3.0–12.0)
Neutro Abs: 7 10*3/uL (ref 1.4–7.7)
Neutrophils Relative %: 76.9 % (ref 43.0–77.0)
Platelets: 370 10*3/uL (ref 150.0–400.0)
RBC: 4.96 Mil/uL (ref 3.87–5.11)
RDW: 17.3 % — ABNORMAL HIGH (ref 11.5–15.5)
WBC: 9.1 10*3/uL (ref 4.0–10.5)

## 2021-03-11 NOTE — Progress Notes (Signed)
? ? ?03/12/2021 ?Stephanie Davies ?[id number] ?[birth year] ? ? ?ASSESSMENT AND PLAN:  ? ?Abdominal pain, left upper quadrant ?-     dicyclomine (BENTYL) 20 MG tablet; Take 1 tablet (20 mg total) by mouth 3 (three) times daily as needed for up to 10 days for spasms. ?Normal colon/EGD 10/2020 ?CT 03/2020 with left kidney stone and borderline splenomegaly. ?Will repeat Ct AB and pelvis with worsening symptoms x 2 weeks, evaluate spleen/recal calculi.  ?Recent labs without anemia, did show microcytosis, will get CMET ?Possible gas pain/constipation, will give samples of linzess, get on gas x, can try FBGARD or dicyclomine  ? ?History of Present Illness:  ?46 y.o. female  with a past medical history of anxiety, depression, obstructive sleep apnea on CPAP, chronic myalgias, prior cholecystectomy for gallstones 2004. and others listed below, known to Dr. Orvan Falconer returns to clinic today for evaluation of left-sided abdominal pain. ? ?Patient last seen in the office 10/07/2020 by Dr. Orvan Falconer for left-sided abdominal pain. ?10/21/2020 colonoscopy and endoscopy-nonbleeding internal hemorrhoids otherwise unremarkable, recall 10 years.  Normal esophagus, nonbleeding gastric ulcers H. pylori negative, no dysplasia erythematous duodenopathy.  Put on pantoprazole ?03/23/20 CT of the abdomen and pelvis with contrast  nonobstructing punctate LEFT renal calculi, interval appendectomy, stable borderline splenomegaly, no acute process ? ?Had renal US 03/07/2021 with mild right renal atrophy, no hydronephrosis. Had normal urine recently with PCP.  ?The severe epigastric AB pain has resolved since appendectomy but she continues to have left flank.  ?She has full left flank, will wake her in the middle of the night and be there all day.  ?"Feels like a lot of pressure from gas that if could be released it would help". ?For 2 weeks has been almost nightly she will wake up at 3 AM. ?She will pass a lot of gas and can help the pain.  ?No nausea,  vomiting, dizziness, SOB, diarrhea. Occ constipation.  ?She has had cough x 3 months worse at night, was having productive mucus and sinus congestion, had some mixed blood, given ABX.  Now just has non productive cough with wheezing. ? ?She eats rice, veggies cooked with tomato sauce, fruits.  ?Will eat dinner around 7 pm, goes to bed at 8-830 pm.  ?No ETOH, no smoking.  ?No NSAIDS.  ?Will wake up multiple times in the night to urinate, will have fatigue when she wakes up. She has mild OSA, she was using it but has not been able to use it since having cough at night.  ?CBC  03/06/2021  ?HGB 12.3 MCV 76.8 without anemia but with microcytosis ?WBC 9.1 Platelets 370.0 ?Kidney function 09/15/2020  ?BUN 8 Cr 0.75  ?GFR >60  ?Potassium 3.6   ?LFTs 03/23/2020  ?AST 24 ALT 26 ?Alkphos 105 TBili 0.6 ?03/23/2020 LIPASE 28 ?06/05/2020 TSH 0.87 ? ?HIV negative 06/01/2020 ? ?US Renal ? ?Result Date: 03/07/2021 ?CLINICAL DATA:  Left flank pain for 1 month.  Dysuria. EXAM: RENAL / URINARY TRACT ULTRASOUND COMPLETE COMPARISON:  None. FINDINGS: Right Kidney: Renal measurements: 13.0 x 4.0 x 5.6 cm = volume: 153 mL. Echogenicity within normal limits. A small cyst is noted in the upper pole measuring 2.2 cm in maximum diameter. No mass or hydronephrosis visualized. Left Kidney: Renal measurements: 13.7 x 7.2 x 7.3 cm = volume: 379 mL. Echogenicity within normal limits. A small cyst is noted in the midpole measuring 2.0 cm in maximum diameter. No mass or hydronephrosis visualized. Bladder: Appears normal for degree of bladder distention. Other:  None. IMPRESSION: Mild right renal atrophy, and small bilateral renal cysts. No evidence of hydronephrosis. Electronically Signed   By: Marlaine Hind M.D.   On: 03/07/2021 14:45   ? ?Current Medications:  ? ? ? ? ? ? ?Current Outpatient Medications (Other):  ?  dicyclomine (BENTYL) 20 MG tablet, Take 1 tablet (20 mg total) by mouth 3 (three) times daily as needed for up to 10 days for spasms. ?   pantoprazole (PROTONIX) 40 MG tablet, Increase to 40 mg twice daily for 10 weeks then reduce to 40 mg once daily. ?  sertraline (ZOLOFT) 100 MG tablet, TAKE 1 & 1/2 (ONE & ONE-HALF) TABLETS BY MOUTH ONCE DAILY ? ?Surgical History:  ?She  has a past surgical history that includes Cholecystectomy (2004); kidney stent: 12/[facility] at [facility]; and laparoscopic appendectomy (N/A, 03/05/2020). ?Family History:  ?Her family history includes Diabetes in her maternal grandfather and mother; Esophageal cancer in her maternal grandfather and maternal grandmother; Hypertension in her mother. ?Social History:  ? reports that she has never smoked. She has never used smokeless tobacco. She reports that she does not drink alcohol and does not use drugs. ? ?Current Medications, Allergies, Past Medical History, Past Surgical History, Family History and Social History were reviewed in [employer] record. ? ?Physical Exam: ?BP 118/78   Pulse 76   Ht 5\' 5"  (1.651 m)   Wt 287 lb (130.2 kg)   LMP 02/17/2021 (Exact Date)   SpO2 97%   BMI 47.76 kg/m?  ?General:   Pleasant, well developed female in no acute distress ?Eyes: sclerae anicteric,conjunctive pink  ?Heart:  regular rate and rhythm ?Pulm: Clear anteriorly; no wheezing ?Abdomen:  Soft, Obese AB, skin exam normal, Normal bowel sounds. mild tenderness in the LUQ. Without guarding and Without rebound, without hepatomegaly.non palpable spleen, non tender costochondritis ?Extremities:  Without edema. Peripheral pulses intact.  ?Neurologic:  Alert and  oriented x4;  grossly normal neurologically. ?Skin:   Dry and intact without significant lesions or rashes. ?Psychiatric: Demonstrates good judgement and reason without abnormal affect or behaviors. ? ?Vladimir Crofts, PA-C ?03/12/21 ?

## 2021-03-12 ENCOUNTER — Ambulatory Visit: Payer: Medicaid Other | Admitting: Physician Assistant

## 2021-03-12 ENCOUNTER — Other Ambulatory Visit (INDEPENDENT_AMBULATORY_CARE_PROVIDER_SITE_OTHER): Payer: Medicaid Other

## 2021-03-12 ENCOUNTER — Encounter: Payer: Self-pay | Admitting: Physician Assistant

## 2021-03-12 VITALS — BP 118/78 | HR 76 | Ht 65.0 in | Wt 287.0 lb

## 2021-03-12 DIAGNOSIS — R1012 Left upper quadrant pain: Secondary | ICD-10-CM | POA: Diagnosis not present

## 2021-03-12 LAB — COMPREHENSIVE METABOLIC PANEL
ALT: 15 U/L (ref 0–35)
AST: 12 U/L (ref 0–37)
Albumin: 4 g/dL (ref 3.5–5.2)
Alkaline Phosphatase: 108 U/L (ref 39–117)
BUN: 9 mg/dL (ref 6–23)
CO2: 25 mEq/L (ref 19–32)
Calcium: 8.8 mg/dL (ref 8.4–10.5)
Chloride: 105 mEq/L (ref 96–112)
Creatinine, Ser: 0.71 mg/dL (ref 0.40–1.20)
GFR: 104.13 mL/min (ref >60.00)
Glucose, Bld: 92 mg/dL (ref 70–99)
Potassium: 3.6 mEq/L (ref 3.5–5.1)
Sodium: 138 mEq/L (ref 135–145)
Total Bilirubin: 0.3 mg/dL (ref 0.2–1.2)
Total Protein: 7.1 g/dL (ref 6.0–8.3)

## 2021-03-12 MED ORDER — DICYCLOMINE HCL 20 MG PO TABS
20.0000 mg | ORAL_TABLET | Freq: Three times a day (TID) | ORAL | 0 refills | Status: DC | PRN
Start: 2021-03-12 — End: 2021-05-25

## 2021-03-12 MED ORDER — LINACLOTIDE 145 MCG PO CAPS: 145.0000 ug | ORAL_CAPSULE | Freq: Every day | ORAL | 0 refills | Status: DC

## 2021-03-12 NOTE — Patient Instructions (Addendum)
You have been scheduled for a CT scan of the abdomen and pelvis at [facility], 1st floor Radiology. You are scheduled on 03/19/21  at 8:00am. You should arrive 15 minutes prior to your appointment time for registration.  Please pick up 2 bottles of contrast from [facility] at least 3 days prior to your scan. The solution may taste better if refrigerated, but do NOT add ice or any other liquid to this solution. Shake well before drinking.   Please follow the written instructions below on the day of your exam:   1) Do not eat anything after 4:00am (4 hours prior to your test)   2) Drink 1 bottle of contrast @ 6:00am (2 hours prior to your exam)  Remember to shake well before drinking and do NOT pour over ice.     Drink 1 bottle of contrast @ 7:00am (1 hour prior to your exam)   You may take any medications as prescribed with a small amount of water, if necessary. If you take any of the following medications: METFORMIN, GLUCOPHAGE, GLUCOVANCE, AVANDAMET, RIOMET, FORTAMET, Morral MET, JANUMET, GLUMETZA or METAGLIP, you MAY be asked to HOLD this medication 48 hours AFTER the exam.   The purpose of you drinking the oral contrast is to aid in the visualization of your intestinal tract. The contrast solution may cause some diarrhea. Depending on your individual set of symptoms, you may also receive an intravenous injection of x-ray contrast/dye. Plan on being at [facility] for 45 minutes or longer, depending on the type of exam you are having performed.   If you have any questions regarding your exam or if you need to reschedule, you may call [facility] at [phone] between the hours of 8:00 am and 5:00 pm, Monday-Friday.   Your provider has requested that you go to the basement level for lab work before leaving today. Press "B" on the elevator. The lab is located at the first door on the left as you exit the elevator.  We have sent the following medications to your pharmacy for  you to pick up at your convenience: Dicyclomine   Try gas x before bed Will try linzess samples Can try IBGARD rather dicyclomine to see if this helps Can do dicyclomine as needed up to 3-4 x a day- suggest trying before bed and in the morning.   Linzess  Take at least 30 minutes before the first meal of the day on an empty stomach You can have a loose stool if you eat a high-fat breakfast. Give it at least 4 days, may have more bowel movements during that time.  If you continue to have severe diarrhea try every other day, if you get AB pain with it stop.  After you are out we can send in a prescription if you did well, there is a prescription card  Linaclotide oral capsules What is this medicine? LINACLOTIDE (lin a KLOE tide) is used to treat irritable bowel syndrome (IBS) with constipation as the main problem. It may also be used for relief of chronic constipation. This medicine may be used for other purposes; ask your health care provider or pharmacist if you have questions. COMMON BRAND NAME(S): Linzess What should I tell my health care provider before I take this medicine? They need to know if you have any of these conditions: history of stool (fecal) impaction now have diarrhea or have diarrhea often other medical condition stomach or intestinal disease, including bowel obstruction or abdominal  adhesions an unusual or allergic reaction to linaclotide, other medicines, foods, dyes, or preservatives pregnant or trying to get pregnant breast-feeding How should I use this medicine? Take this medicine by mouth with a glass of water. Follow the directions on the prescription label. Do not cut, crush or chew this medicine. Take on an empty stomach, at least 30 minutes before your first meal of the day. Take your medicine at regular intervals. Do not take your medicine more often than directed. Do not stop taking except on your doctor's advice. A special MedGuide will be given to you by  the pharmacist with each prescription and refill. Be sure to read this information carefully each time. Talk to your pediatrician regarding the use of this medicine in children. This medicine is not approved for use in children. Overdosage: If you think you have taken too much of this medicine contact a poison control center or emergency room at once. NOTE: This medicine is only for you. Do not share this medicine with others. What if I miss a dose? If you miss a dose, just skip that dose. Wait until your next dose, and take only that dose. Do not take double or extra doses. What may interact with this medicine? certain medicines for bowel problems or bladder incontinence (these can cause constipation) This list may not describe all possible interactions. Give your health care provider a list of all the medicines, herbs, non-prescription drugs, or dietary supplements you use. Also tell them if you smoke, drink alcohol, or use illegal drugs. Some items may interact with your medicine. What should I watch for while using this medicine? Visit your doctor for regular check ups. Tell your doctor if your symptoms do not get better or if they get worse. Diarrhea is a common side effect of this medicine. It often begins within 2 weeks of starting this medicine. Stop taking this medicine and call your doctor if you get severe diarrhea. Stop taking this medicine and call your doctor or go to the nearest hospital emergency room right away if you develop unusual or severe stomach-area (abdominal) pain, especially if you also have bright red, bloody stools or black stools that look like tar. What side effects may I notice from receiving this medicine? Side effects that you should report to your doctor or health care professional as soon as possible: allergic reactions like skin rash, itching or hives, swelling of the face, lips, or tongue black, tarry stools bloody or watery diarrhea new or worsening stomach  pain severe or prolonged diarrhea Side effects that usually do not require medical attention (report to your doctor or health care professional if they continue or are bothersome): bloating gas loose stools This list may not describe all possible side effects. Call your doctor for medical advice about side effects. You may report side effects to FDA at 1-800-FDA-1088. Where should I keep my medicine? Keep out of the reach of children. Store at room temperature between 20 and 25 degrees C (68 and 77 degrees F). Keep this medicine in the original container. Keep tightly closed in a dry place. Do not remove the desiccant packet from the bottle, it helps to protect your medicine from moisture. Throw away any unused medicine after the expiration date. NOTE: This sheet is a summary. It may not cover all possible information. If you have questions about this medicine, talk to your doctor, pharmacist, or health care provider.  2020 Elsevier/Gold Standard (2015-01-30 12:17:04)   If diarrhea is severe -  4+ watery diarrhea episodes, take imodium to reduce fluid loss, and start on electrolyte supplemented fluids.   Please go to the ER if you have any severe AB pain, unable to hold down food/water, blood in stool or vomit, chest pain, shortness of breath, or any worsening symptoms.    Consider keeping a food diary- common causes of diarrhea are dairy, certain carbs...   FODMAP stands for fermentable oligo-, di-, mono-saccharides and polyols (1). These are the scientific terms used to classify groups of carbs that are notorious for triggering digestive symptoms like bloating, gas and stomach pain.   FODMAPs are found in a wide range of foods in varying amounts. Some foods contain just one type, while others contain several.  The main dietary sources of the four groups of FODMAPs include:  Oligosaccharides: Wheat, rye, legumes and various fruits and vegetables, such as garlic and  onions.  Disaccharides: Milk, yogurt and soft cheese. Lactose is the main carb.  Monosaccharides: Various fruit including figs and mangoes, and sweeteners such as honey and agave nectar. Fructose is the main carb.  Polyols: Certain fruits and vegetables including blackberries and lychee, as well as some low-calorie sweeteners like those in sugar-free gum.   Keep a food diary. This will help you identify foods that cause symptoms. Write down: What you eat and when. What symptoms you have. When symptoms occur in relation to your meals. Avoid foods that cause symptoms. Talk with your dietitian about other ways to get the same nutrients that are in these foods. Eat your meals slowly, in a relaxed setting. Aim to eat 5-6 small meals per day. Do not skip meals. Drink enough fluids to keep your urine clear or pale yellow. If dairy products cause your symptoms to flare up, try eating less of them. You might be able to handle yogurt better than other dairy products because it contains bacteria that help with digestion.

## 2021-03-12 NOTE — Progress Notes (Signed)
Reviewed.  Kyre Jeffries L. Idalys Konecny, MD, MPH  

## 2021-03-16 ENCOUNTER — Telehealth: Payer: Self-pay | Admitting: Physician Assistant

## 2021-03-16 NOTE — Telephone Encounter (Signed)
Radiology dept called that the CT order on file needs PA patient is scheduled for 03/19/21. ?

## 2021-03-17 NOTE — Telephone Encounter (Signed)
Patient's appointment moved to 03/27/2021 to give [place] time to get this approved. Patient aware of date/time change. ?

## 2021-03-19 ENCOUNTER — Ambulatory Visit (HOSPITAL_COMMUNITY): Payer: Medicaid Other

## 2021-03-19 ENCOUNTER — Other Ambulatory Visit: Payer: Self-pay | Admitting: Internal Medicine

## 2021-03-27 ENCOUNTER — Other Ambulatory Visit: Payer: Self-pay

## 2021-03-27 ENCOUNTER — Ambulatory Visit: Payer: Medicaid Other | Admitting: Internal Medicine

## 2021-03-27 ENCOUNTER — Ambulatory Visit (HOSPITAL_COMMUNITY)
Admission: RE | Admit: 2021-03-27 | Discharge: 2021-03-27 | Disposition: A | Discharge status: Home / Self Care | Payer: Medicaid Other | Source: Ambulatory Visit | Attending: Physician Assistant | Admitting: Physician Assistant

## 2021-03-27 DIAGNOSIS — N2889 Other specified disorders of kidney and ureter: Secondary | ICD-10-CM | POA: Diagnosis not present

## 2021-03-27 DIAGNOSIS — R1012 Left upper quadrant pain: Secondary | ICD-10-CM | POA: Diagnosis not present

## 2021-03-27 MED ORDER — SODIUM CHLORIDE (PF) 0.9 % IJ SOLN
INTRAMUSCULAR | Status: AC
  Filled 2021-03-27: qty 50

## 2021-03-27 MED ORDER — IOHEXOL 300 MG/ML  SOLN
100.0000 mL | Freq: Once | INTRAMUSCULAR | Status: AC | PRN
  Administered 2021-03-27: 100 mL via INTRAVENOUS

## 2021-04-20 ENCOUNTER — Encounter: Payer: Self-pay | Admitting: Internal Medicine

## 2021-04-20 ENCOUNTER — Encounter: Payer: Medicaid Other | Admitting: Internal Medicine

## 2021-04-24 ENCOUNTER — Ambulatory Visit: Payer: Medicaid Other | Admitting: Gastroenterology

## 2021-04-24 ENCOUNTER — Encounter: Payer: Self-pay | Admitting: Gastroenterology

## 2021-04-24 VITALS — BP 120/70 | HR 85 | Ht 65.0 in | Wt 288.8 lb

## 2021-04-24 DIAGNOSIS — R1012 Left upper quadrant pain: Secondary | ICD-10-CM | POA: Diagnosis not present

## 2021-04-24 DIAGNOSIS — R935 Abnormal findings on diagnostic imaging of other abdominal regions, including retroperitoneum: Secondary | ICD-10-CM

## 2021-04-24 DIAGNOSIS — R109 Unspecified abdominal pain: Secondary | ICD-10-CM | POA: Diagnosis not present

## 2021-04-24 MED ORDER — HYOSCYAMINE SULFATE 0.125 MG SL SUBL: 0.1250 mg | SUBLINGUAL_TABLET | SUBLINGUAL | 3 refills | Status: DC | PRN

## 2021-04-24 NOTE — Progress Notes (Signed)
? ?Referring Provider: Colon Branch, MD ?Primary Care Physician:  Colon Branch, MD ? ?Chief complaint:  Abdominal pain ? ? ?IMPRESSION:  ?Acute on chronic left back, left flank and related left-sided abdominal pain with associated bloating ?Chronic constipation improved on Linzess ?Gastric ulcers, erosions and gastritis on EGD 10/22 ?History of upper abdominal pain improved after appendectomy ?Mild thrombocytosis ?Kidney stone seen on prior CT ?Left kidney lesion seen on recent CT-awaiting urology consultation ? ? ?Abdominal pain is associated with constipation and bloating.  Despite improvement in constipation, her abdominal pain persists.  However, it really is more a left flank and left back pain than it is an abdominal pain.  IBS is certainly a possibility.  However, she should see urology for further evaluation given the atypical location of the pain. ? ?PLAN: ?- Continue Hyoscyamine 0.125 mg q4 hours ?- Continue pantoprazole 40 mg QAM ?- Trial Linzess 290 mcg instead of 145 mcg daily (samples provided) ?- Discussed lowFODMAP diet and even IBS hyponosis APP ?- Trial of IBGuard versus Heathers Tummy Tamers for additional symptom management ?- See patient instructions for additional recommendations ?- Follow-up in 4-6 weeks after Urology consultation, earlier if needed ?- Low threshold to repeat EGD to document resolution of gastric ulcers and erosions if symptoms persist as biopsies previously negative for H pylori and there is no history of NSAIDs ? ? ?Please see the "Patient Instructions" section for addition details about the plan. ? ?HPI: Stephanie Davies is a 46 y.o. female returns in follow-up for further evaluation of left-sided back and flank pain that radiates to the left abdomen.  Her husband, a Gaffer in [place], accompanies her to this appointment.  She has anxiety, depression, obstructive sleep apnea on CPAP, chronic myalgias, prior cholecystectomy for gallstones 2004.  Previously seen by Dr.  Fuller Plan in 2012 and Dr. Deatra Ina 2016.   ? ?She reports a chronic, constant, left flank pain that radiates to the groin as well as an upper abdominal pain. Three ER visits for similar symptoms. Ultimately found to have appendicitis. Upper abdominal pain has improved after appendectomy, but the the left sided abdominal pain persists. Pain is constant. Worse with constipation. Using OTC dulcolax for constipation. No reflux. Associated bloating. No change with eating or movement. There is no blood or mucus in the stool. ? ?Evaluation has included multiple CT scans over the last 2-3 years as well as: ?- EGD in [place] 2 years ago for similar symptoms. ?- CT of the abdomen and pelvis with contrast 03/23/20: nonobstructing punctate renal calculi, interval appendectomy, stable borderline splenomegaly, no acute process ?- Labs 06/05/20: ESR 58 ?- Labs 09/15/20: normal BMP except glucose 107, hemoglobin 13.9, platelets normal ?- Colonoscopy 10/21/2020 showed internal hemorrhoids but was otherwise normal ?-EGD 10/21/2020 showed gastric ulcers, erosions, and gastritis.  Biopsies negative for H. pylori. ?-Renal ultrasound 03/07/2021 showed mild right renal atrophy and no hydronephrosis ?-CT of the abdomen pelvis with contrast 03/27/2021 showed a lesion in the left kidney thought to be hemorrhage, no acute process in the abdomen ? ?She continues to have pain. But it is still present primarily at night. Constipation improved on Linzess. Dicyclomine helps with the pain but does not provide extended relief.  Pain is worse with stress.  She continues to be quite bothered by the left flank, left back pain that radiates to the left lower abdomen. ? ?Her husband notes a history of kidney stones. He feels that her recent pain is gas related.  ? ?She  denies the use of alcohol or NSAIDs. ? ? ?Maternal grandmother and grandfather with esophageal cancer. No known family history of colon cancer or polyps. No family history of uterine/endometrial  cancer, pancreatic cancer or gastric/stomach cancer. ? ? ? ? ?Past Medical History:  ?Diagnosis Date  ? Alopecia   ? Anal fistula   ? Anxiety and depression 03/07/2014  ? COVID   ? GERD (gastroesophageal reflux disease) 02/14/2014  ? Goiter 03/07/2014  ? Headache(784.0)   ? Hemorrhoids   ? Kidney stone   ? Pseudotumor cerebri   ? ? ?Past Surgical History:  ?Procedure Laterality Date  ? CHOLECYSTECTOMY  2004  ? kidney stent: 12/2018 at [facility]    ? LAPAROSCOPIC APPENDECTOMY N/A 03/05/2020  ? Procedure: APPENDECTOMY LAPAROSCOPIC;  Surgeon: Felicie Morn, MD;  Location: [facility] ORS;  Service: General;  Laterality: N/A;  ? ? ?Current Outpatient Medications  ?Medication Sig Dispense Refill  ? dicyclomine (BENTYL) 20 MG tablet Take 1 tablet (20 mg total) by mouth 3 (three) times daily as needed for up to 10 days for spasms. 40 tablet 0  ? hyoscyamine (LEVSIN SL) 0.125 MG SL tablet Place 1 tablet (0.125 mg total) under the tongue every 4 (four) hours as needed. 120 tablet 3  ? linaclotide (LINZESS) 145 MCG CAPS capsule Take 1 capsule (145 mcg total) by mouth daily before breakfast. 12 capsule 0  ? pantoprazole (PROTONIX) 40 MG tablet Increase to 40 mg twice daily for 10 weeks then reduce to 40 mg once daily. 60 tablet 3  ? sertraline (ZOLOFT) 100 MG tablet TAKE 1 & 1/2 (ONE & ONE-HALF) TABLETS BY MOUTH ONCE DAILY 45 tablet 1  ? ?No current facility-administered medications for this visit.  ? ? ?Allergies as of 04/24/2021  ? (No Known Allergies)  ? ? ?Family History  ?Problem Relation Age of Onset  ? Diabetes Mother   ? Hypertension Mother   ? Esophageal cancer Maternal Grandmother   ? Esophageal cancer Maternal Grandfather   ? Diabetes Maternal Grandfather   ? Colon cancer Neg Hx   ? Colon polyps Neg Hx   ? Gallbladder disease Neg Hx   ? Kidney disease Neg Hx   ? ? ? ?Physical Exam: ?General:   Alert,  well-nourished, pleasant and cooperative in NAD ?Head:  Normocephalic and atraumatic. ?Eyes:  Sclera clear, no icterus.    Conjunctiva pink. ?Abdomen:  Soft, I am unable to reproduce her pain which she localized to the left rib/flank down the right flank, nondistended, normal bowel sounds, no rebound or guarding. No hepatosplenomegaly.   ?Neurologic:  Alert and  oriented x4;  grossly nonfocal ?Skin:  Intact without significant lesions or rashes. ?Psych:  Alert and cooperative. Normal mood and affect. ? ? ? ?Stephanie Davies L. Tarri Glenn, MD, MPH ?04/24/2021, 4:19 PM ? ? ? ? ?

## 2021-04-24 NOTE — Patient Instructions (Addendum)
I am thankful that the Linzess is helping with the constipation but I would like for your pain to be better. I gave you samples of the higher dose of Linzess as this can be more helpful in IBS.  ? ?It will be helpful to see the Urologist.  ? ?In the meantime, please try IBGuard or Heather's Tummy Tamers (available on Amazon) to see if this helps. ? ?There isn't currently a cure for irritable bowel syndrome (IBS), but with a combination of medical treatment and diet and lifestyle adjustments, symptoms can be managed. ? ?Because IBS if often made worse by stress, taking steps to manage stress in your life may help relieve symptoms. Some people find that relaxation techniques including breathing exercises, meditation, and yoga help reduce stress. As well, physical activity and adequate sleep can help control stress, as can any activity that you find calming - reading, listening to music, taking a walk with a friend, for example. Counseling and support from counselors, friends, family, and support groups can also help manage stress and help you find ways to avoid or navigate stressful situations. ? ?Keep track of the foods that seem to trigger symptoms. Some people find that a written record, or food journal, helps them identify problem foods. Record details such as what you eat, what time of day, and how much and how you feel afterward. By avoiding foods that seem to cause discomfort, you may be able to reduce symptoms.  ? ?Some dietary tips that may help relief symptoms include: ?- Drink plenty of water and avoid carbonated beverages, which may cause gas ?- Don't chew gum or eat too quickly; these behaviors can cause you to swallow air, which can cause gas. ?- Try eating smaller meals more frequently and avoid large meals. ?- Try increasing fiber in your diet gradually. Fiber can help reduce both constipation and diarrhea, but it can also make gas and cramping worse. I recommended slowing increasing the amount of  fiber that you eat. ?- Avoid foods and beverages that make you fee worse. For some people these include alcohol, drinks with caffeine, dairy products, beans, cauliflower, cabbage, and broccoli.  ? ?A common dietary guideline for people suffering from IBS is a diet known as FODMAP. This stands for "fermentable oligodi-monosaccharides and polyols," or, more simply, certain types of carbohydrates found in foods that are hard to digest. By following FODMAP, also known as a low-FODMAP diet, you avoid or limit these particular carbohydrates. Some of the foods that contain FODMAPs include: ?- Fruits such as apples, apricots, blackberries, cherries, mango, nectarines, pears, plums, and watermelon, or juice containing any of these fruits ?- Canned fruit in natural fruit juice, or large quantities of fruit juice or dried fruit ?- Vegentables such as artichokes, asparagus, beans, cabbage, cauliflower, garlic and garlic salts, lentils, mushrooms, onions, and sugar snap or snow peas ?- Dairy products such as milk, milk products, soft cheese, yogurt, custard, and ice cream ?- Wheat and rye products ?- Honey and foods with high-fructose corn syrup  ?- Products, including candy and gum, with sweeteners ending in "-ol" (for example, sorbitol, mannitol, xylitol, and maltitol) ? ?My favorite websites for more information include: ?BakingBrokers.se ?uMourn.cz ?http://johnston-ramirez.com/ ? ? We also discussed using Nerva - a hypnosis website- as a new treatment option for IBS.  ? ? ?

## 2021-04-28 DIAGNOSIS — R1084 Generalized abdominal pain: Secondary | ICD-10-CM | POA: Diagnosis not present

## 2021-04-28 DIAGNOSIS — N281 Cyst of kidney, acquired: Secondary | ICD-10-CM | POA: Diagnosis not present

## 2021-04-29 ENCOUNTER — Other Ambulatory Visit: Payer: Self-pay | Admitting: Urology

## 2021-04-29 DIAGNOSIS — N281 Cyst of kidney, acquired: Secondary | ICD-10-CM

## 2021-05-21 ENCOUNTER — Ambulatory Visit: Payer: Medicaid Other | Admitting: Internal Medicine

## 2021-05-21 ENCOUNTER — Encounter: Payer: Self-pay | Admitting: Internal Medicine

## 2021-05-21 VITALS — BP 124/68 | HR 65 | Temp 98.3°F | Resp 18 | Ht 65.0 in | Wt 292.5 lb

## 2021-05-21 DIAGNOSIS — R053 Chronic cough: Secondary | ICD-10-CM | POA: Diagnosis not present

## 2021-05-21 DIAGNOSIS — R042 Hemoptysis: Secondary | ICD-10-CM | POA: Diagnosis not present

## 2021-05-21 LAB — POCT URINE PREGNANCY: Preg Test, Ur: NEGATIVE

## 2021-05-21 MED ORDER — BENZONATATE 200 MG PO CAPS: 200.0000 mg | ORAL_CAPSULE | Freq: Three times a day (TID) | ORAL | 0 refills | Status: DC | PRN

## 2021-05-21 MED ORDER — AZITHROMYCIN 250 MG PO TABS: ORAL_TABLET | ORAL | 0 refills | Status: AC

## 2021-05-21 MED ORDER — BUDESONIDE-FORMOTEROL FUMARATE 80-4.5 MCG/ACT IN AERO: 2.0000 | INHALATION_SPRAY | Freq: Two times a day (BID) | RESPIRATORY_TRACT | 3 refills | Status: DC

## 2021-05-21 NOTE — Progress Notes (Signed)
? ?Subjective:  ? ? Patient ID: Stephanie Davies, female    DOB: [birth year], 46 y.o.   MRN: [id number] ? ?DOS:  05/21/2021 ?Type of visit - description: Acute ? ?Has a history of cough. ?Symptoms were very good until 2 weeks ago when she developed runny nose, feeling of tightness in the chest, wheezing at night. ?She is also coughing yellowish sputum, occasionally has seen mix drops of blood . ? ?Denies fever chills.  No weight loss. ?Denies any major problems with itchy eyes, itchy nose or sneezing. ?No GERD symptoms ? ? ?Review of Systems ?See above  ? ?Past Medical History:  ?Diagnosis Date  ? Alopecia   ? Anal fistula   ? Anxiety and depression 03/07/2014  ? COVID   ? GERD (gastroesophageal reflux disease) 02/14/2014  ? Goiter 03/07/2014  ? Headache(784.0)   ? Hemorrhoids   ? Kidney stone   ? Pseudotumor cerebri   ? ? ?Past Surgical History:  ?Procedure Laterality Date  ? CHOLECYSTECTOMY  2004  ? kidney stent: 12/2018 [facility]    ? LAPAROSCOPIC APPENDECTOMY N/A 03/05/2020  ? Procedure: APPENDECTOMY LAPAROSCOPIC;  Surgeon: Quentin Ore, MD;  Location: [facility];  Service: General;  Laterality: N/A;  ? ? ?Current Outpatient Medications  ?Medication Instructions  ? dicyclomine (BENTYL) 20 mg, Oral, 3 times daily PRN  ? hyoscyamine (LEVSIN SL) 0.125 mg, Sublingual, Every 4 hours PRN  ? linaclotide (LINZESS) 145 mcg, Oral, Daily before breakfast  ? pantoprazole (PROTONIX) 40 MG tablet Increase to 40 mg twice daily for 10 weeks then reduce to 40 mg once daily.  ? sertraline (ZOLOFT) 100 MG tablet TAKE 1 & 1/2 (ONE & ONE-HALF) TABLETS BY MOUTH ONCE DAILY  ? ? ?   ?Objective:  ? Physical Exam ?BP 124/68 (BP Location: Left Arm, Patient Position: Sitting, Cuff Size: Normal)   Pulse 65   Temp 98.3 ?F (36.8 ?C) (Oral)   Resp 18   Ht 5\' 5"  (1.651 m)   Wt 292 lb 8 oz (132.7 kg)   LMP 05/12/2021 (Exact Date)   SpO2 93%   BMI 48.67 kg/m?  ?General:   ?Well developed, NAD, BMI noted.  Recurrent cough noted. ?HEENT:   ?Normocephalic . Face symmetric, atraumatic ?TMs: Normal. ?Throat: Symmetric, not red. ?Lungs:  ?No actual wheezing, few rhonchi only with forced cough. ?Normal respiratory effort, no intercostal retractions, no accessory muscle use. ?Heart: RRR,  no murmur.  ?Lower extremities: no pretibial edema bilaterally  ?Skin: Not pale. Not jaundice ?Neurologic:  ?alert & oriented X3.  ?Speech normal, gait appropriate for age and unassisted ?Psych--  ?Cognition and judgment appear intact.  ?Cooperative with normal attention span and concentration.  ?Behavior appropriate. ?No anxious or depressed appearing.  ? ?   ?Assessment   ? ? ASSESSMENT ?Anxiety, depression ?pseudotumor cerebri, headaches ?Goiter : 07/12/2021 03-2014, bx  Done 11-2015 ?IBS ?Kidney stones, kidney stent R 12/2018 renal cysts found on a renal ultrasound in 01/2019 ?Goiter: Biopsy 08/23/2019, [facility] category 2 benign ?OSA: Rx CPAP ?Headaches: See neurology note 06/03/2020 ?Recurrent cough (Saw  pulmonology- 2016, dx  asthma unlikely, upper airway cough syndrome GERD/sinusitis/throat clearing) ?Not on BCP  ? ? ?PLAN: ?Cough - hemoptysis - bronchospasm by history: ?Cough has been on and off  for years. ?Saw Dr. 2017, pulmonology- 2016, dx  asthma unlikely, upper airway cough syndrome GERD/sinusitis/throat clearing ?Was seen by me in January, at the time she had cough, yellow sputum and hemoptysis, got better. ?Now symptoms has resurfaced in  the context of high pollen in the environment. ?Plan: ?Chest x-ray (UPT before x-ray), TB Gold (has been  [place]se[place]), Symbicort, Mucinex, tessalon  and  Zithromax. ?Refer back to pulmonary ?Allergies may be contributing to symptoms, recommend Allegra, Flonase.  See AVS ?Leukocytosis, thrombocytosis: Last CBC okay ?Left-sided abdominal pain, flank pain, saw GI twice: ?- CT March 2023 showed left kidney lesion, referred to urology. ?- At the last visit they recommend to continue bentyl, pantoprazole, Linzess, low FODMAP diet, trial with  IBguard. ?RTC CPX at her convenience ?  ?

## 2021-05-21 NOTE — Patient Instructions (Addendum)
Please schedule a physical exam at your earliest convenience.  ? ?We are referring you to pulmonary, the number is [phone] ? ?Provide a blood test ? ?Get a chest x-ray downstairs ? ?For cough: ?Symbicort 2 puffs twice daily ?Zithromax antibiotic ?Get over-the-counter Mucinex DM, take it twice a day until better ?If you are not gradually improving let me know. ? ?I think you have allergies: ?Over-the-counter Allegra 60 mg twice daily until better. ?Over-the-counter Flonase 2 sprays on each side of the nose daily ? ? ?

## 2021-05-21 NOTE — Assessment & Plan Note (Signed)
Cough - hemoptysis - bronchospasm by history: ?Cough has been on and off  for years. ?Saw Dr. Melvyn Novas, pulmonology- 2016, dx  asthma unlikely, upper airway cough syndrome GERD/sinusitis/throat clearing ?Was seen by me in January, at the time she had cough, yellow sputum and hemoptysis, got better. ?Now symptoms has resurfaced in the context of high pollen in the environment. ?Plan: ?Chest x-ray (UPT before x-ray), TB Gold (has been  overseas), Symbicort, Mucinex, tessalon  and  Zithromax. ?Refer back to pulmonary ?Allergies may be contributing to symptoms, recommend Allegra, Flonase.  See AVS ?Leukocytosis, thrombocytosis: Last CBC okay ?Left-sided abdominal pain, flank pain, saw GI twice: ?- CT March 2023 showed left kidney lesion, referred to urology. ?- At the last visit they recommend to continue bentyl, pantoprazole, Linzess, low FODMAP diet, trial with IBguard. ?RTC CPX at her convenience ?

## 2021-05-22 ENCOUNTER — Other Ambulatory Visit: Payer: Medicaid Other

## 2021-05-23 LAB — QUANTIFERON-TB GOLD PLUS
Mitogen-NIL: >10 IU/mL
NIL: 0.05 IU/mL
QuantiFERON-TB Gold Plus: NEGATIVE
TB1-NIL: 0.04 IU/mL
TB2-NIL: 0.03 IU/mL

## 2021-05-24 ENCOUNTER — Encounter (HOSPITAL_COMMUNITY): Payer: Self-pay | Admitting: Emergency Medicine

## 2021-05-24 ENCOUNTER — Emergency Department (HOSPITAL_COMMUNITY)
Admission: EM | Admit: 2021-05-24 | Discharge: 2021-05-25 | Disposition: A | Discharge status: Home / Self Care | Payer: Medicaid Other | Attending: Emergency Medicine | Admitting: Emergency Medicine

## 2021-05-24 DIAGNOSIS — R112 Nausea with vomiting, unspecified: Secondary | ICD-10-CM | POA: Diagnosis not present

## 2021-05-24 DIAGNOSIS — R42 Dizziness and giddiness: Secondary | ICD-10-CM | POA: Diagnosis not present

## 2021-05-24 DIAGNOSIS — R197 Diarrhea, unspecified: Secondary | ICD-10-CM | POA: Diagnosis not present

## 2021-05-24 DIAGNOSIS — N281 Cyst of kidney, acquired: Secondary | ICD-10-CM | POA: Diagnosis not present

## 2021-05-24 DIAGNOSIS — N200 Calculus of kidney: Secondary | ICD-10-CM | POA: Diagnosis not present

## 2021-05-24 DIAGNOSIS — R519 Headache, unspecified: Secondary | ICD-10-CM | POA: Insufficient documentation

## 2021-05-24 DIAGNOSIS — R000 Tachycardia, unspecified: Secondary | ICD-10-CM | POA: Diagnosis not present

## 2021-05-24 DIAGNOSIS — D72829 Elevated white blood cell count, unspecified: Secondary | ICD-10-CM | POA: Insufficient documentation

## 2021-05-24 DIAGNOSIS — K7689 Other specified diseases of liver: Secondary | ICD-10-CM | POA: Diagnosis not present

## 2021-05-24 DIAGNOSIS — R1013 Epigastric pain: Secondary | ICD-10-CM | POA: Insufficient documentation

## 2021-05-24 LAB — URINALYSIS, ROUTINE W REFLEX MICROSCOPIC
Bilirubin Urine: NEGATIVE
Glucose, UA: NEGATIVE mg/dL
Hgb urine dipstick: NEGATIVE
Ketones, ur: NEGATIVE mg/dL
Leukocytes,Ua: NEGATIVE
Nitrite: NEGATIVE
Protein, ur: NEGATIVE mg/dL
Specific Gravity, Urine: 1.016 (ref 1.005–1.030)
pH: 5 (ref 5.0–8.0)

## 2021-05-24 LAB — CBC
HCT: 42.4 % (ref 36.0–46.0)
Hemoglobin: 13.4 g/dL (ref 12.0–15.0)
MCH: 24.9 pg — ABNORMAL LOW (ref 26.0–34.0)
MCHC: 31.6 g/dL (ref 30.0–36.0)
MCV: 78.8 fL — ABNORMAL LOW (ref 80.0–100.0)
Platelets: 352 10*3/uL (ref 150–400)
RBC: 5.38 MIL/uL — ABNORMAL HIGH (ref 3.87–5.11)
RDW: 16.9 % — ABNORMAL HIGH (ref 11.5–15.5)
WBC: 11.1 10*3/uL — ABNORMAL HIGH (ref 4.0–10.5)
nRBC: 0 % (ref 0.0–0.2)

## 2021-05-24 LAB — I-STAT BETA HCG BLOOD, ED (MC, WL, AP ONLY): I-stat hCG, quantitative: <5 m[IU]/mL (ref <5)

## 2021-05-24 LAB — COMPREHENSIVE METABOLIC PANEL
ALT: 24 U/L (ref 0–44)
AST: 21 U/L (ref 15–41)
Albumin: 3.7 g/dL (ref 3.5–5.0)
Alkaline Phosphatase: 99 U/L (ref 38–126)
Anion gap: 8 (ref 5–15)
BUN: 10 mg/dL (ref 6–20)
CO2: 19 mmol/L — ABNORMAL LOW (ref 22–32)
Calcium: 8.5 mg/dL — ABNORMAL LOW (ref 8.9–10.3)
Chloride: 108 mmol/L (ref 98–111)
Creatinine, Ser: 0.78 mg/dL (ref 0.44–1.00)
GFR, Estimated: >60 mL/min (ref >60)
Glucose, Bld: 107 mg/dL — ABNORMAL HIGH (ref 70–99)
Potassium: 3.7 mmol/L (ref 3.5–5.1)
Sodium: 135 mmol/L (ref 135–145)
Total Bilirubin: 0.6 mg/dL (ref 0.3–1.2)
Total Protein: 7.5 g/dL (ref 6.5–8.1)

## 2021-05-24 LAB — LIPASE, BLOOD: Lipase: 27 U/L (ref 11–51)

## 2021-05-24 MED ORDER — FAMOTIDINE IN NACL 20-0.9 MG/50ML-% IV SOLN
20.0000 mg | Freq: Once | INTRAVENOUS | Status: AC
  Administered 2021-05-24: 20 mg via INTRAVENOUS
  Filled 2021-05-24: qty 50

## 2021-05-24 MED ORDER — DIPHENHYDRAMINE HCL 50 MG/ML IJ SOLN
12.5000 mg | Freq: Once | INTRAMUSCULAR | Status: AC
  Administered 2021-05-24: 12.5 mg via INTRAVENOUS
  Filled 2021-05-24: qty 1

## 2021-05-24 MED ORDER — SODIUM CHLORIDE 0.9 % IV BOLUS
1500.0000 mL | Freq: Once | INTRAVENOUS | Status: AC
  Administered 2021-05-24: 1500 mL via INTRAVENOUS

## 2021-05-24 MED ORDER — METOCLOPRAMIDE HCL 5 MG/ML IJ SOLN
10.0000 mg | Freq: Once | INTRAMUSCULAR | Status: AC
  Administered 2021-05-24: 10 mg via INTRAVENOUS
  Filled 2021-05-24: qty 2

## 2021-05-24 NOTE — ED Triage Notes (Signed)
Pt reports epigastric abdominal pain, diarrhea and vomiting that started today. Also reports severe headache. Reports that she is on her 4th day of antibiotics for a cough. ?

## 2021-05-25 ENCOUNTER — Emergency Department (HOSPITAL_COMMUNITY): Payer: Medicaid Other

## 2021-05-25 DIAGNOSIS — N200 Calculus of kidney: Secondary | ICD-10-CM | POA: Diagnosis not present

## 2021-05-25 DIAGNOSIS — K7689 Other specified diseases of liver: Secondary | ICD-10-CM | POA: Diagnosis not present

## 2021-05-25 DIAGNOSIS — R519 Headache, unspecified: Secondary | ICD-10-CM | POA: Diagnosis not present

## 2021-05-25 DIAGNOSIS — N281 Cyst of kidney, acquired: Secondary | ICD-10-CM | POA: Diagnosis not present

## 2021-05-25 MED ORDER — FAMOTIDINE 20 MG PO TABS: 20.0000 mg | ORAL_TABLET | Freq: Two times a day (BID) | ORAL | 0 refills | Status: DC | PRN

## 2021-05-25 MED ORDER — HYDROMORPHONE HCL 1 MG/ML IJ SOLN
0.5000 mg | Freq: Once | INTRAMUSCULAR | Status: AC
  Administered 2021-05-25: 0.5 mg via INTRAVENOUS
  Filled 2021-05-25: qty 1

## 2021-05-25 MED ORDER — ONDANSETRON 4 MG PO TBDP: 4.0000 mg | ORAL_TABLET | Freq: Three times a day (TID) | ORAL | 0 refills | Status: DC | PRN

## 2021-05-25 MED ORDER — DICYCLOMINE HCL 20 MG PO TABS
20.0000 mg | ORAL_TABLET | Freq: Three times a day (TID) | ORAL | 0 refills | Status: DC | PRN
Start: 2021-05-25 — End: 2022-06-25

## 2021-05-25 MED ORDER — IOHEXOL 300 MG/ML  SOLN
100.0000 mL | Freq: Once | INTRAMUSCULAR | Status: AC | PRN
  Administered 2021-05-25: 100 mL via INTRAVENOUS

## 2021-05-25 MED ORDER — DICYCLOMINE HCL 20 MG PO TABS: 20.0000 mg | ORAL_TABLET | Freq: Three times a day (TID) | ORAL | 0 refills | Status: DC | PRN

## 2021-05-25 NOTE — ED Notes (Signed)
Back from CT

## 2021-05-25 NOTE — ED Notes (Signed)
To CT

## 2021-05-25 NOTE — ED Provider Notes (Signed)
?[place] DEPT ?Provider Note ? ? ?CSN: [id number] ?Arrival date & time: 05/24/21  2127 ? ?  ? ?History ? ?Chief Complaint  ?Patient presents with  ? Abdominal Pain  ? ? ?Stephanie Davies is a 46 y.o. female with a history of anxiety, depression, kidney stones, pseudotumor cerebri, IBS, and prior appendectomy and cholecystectomy who presents to the emergency department with complaints of headache and abdominal pain today.  Patient states that she woke up with gradual onset headache which has progressively worsened to the generalized head, similar to prior headaches but more severe.  She also woke up with nausea, vomiting, diarrhea, and epigastric and left upper quadrant abdominal pain.  She has vomited 4-5 times and had 9 episodes of diarrhea that is watery, her abdominal pain is constant without alleviating or aggravating factors.  She has not been able to keep anything down.  She is currently on azithromycin (she thinks this is the name of the antibiotic) for a cough.  She does continue to cough.  She has felt lightheaded and dizzy.  She denies fever, hematemesis, melena, hematochezia, dysuria, visual disturbance, numbness, weakness, or syncope.  She denies history of foreign travel. ? ?HPI ? ?  ? ?Home Medications ?Prior to Admission medications   ?Medication Sig Start Date End Date Taking? Authorizing Provider  ?azithromycin (ZITHROMAX) 250 MG tablet Take 2 tablets on day 1, then 1 tablet daily on days 2 through 5 05/21/21 05/26/21  Colon Branch, MD  ?benzonatate (TESSALON) 200 MG capsule Take 1 capsule (200 mg total) by mouth 3 (three) times daily as needed for cough. 05/21/21   Colon Branch, MD  ?budesonide-formoterol [facility]) 80-4.5 MCG/ACT inhaler Inhale 2 puffs into the lungs 2 (two) times daily. 05/21/21   Colon Branch, MD  ?dicyclomine (BENTYL) 20 MG tablet Take 1 tablet (20 mg total) by mouth 3 (three) times daily as needed for up to 10 days for spasms. 03/12/21   Vladimir Crofts, PA-C  ?hyoscyamine (LEVSIN SL) 0.125 MG SL tablet Place 1 tablet (0.125 mg total) under the tongue every 4 (four) hours as needed. 04/24/21   Thornton Park, MD  ?linaclotide Rolan Lipa) 145 MCG CAPS capsule Take 1 capsule (145 mcg total) by mouth daily before breakfast. 03/12/21   Vladimir Crofts, PA-C  ?pantoprazole (PROTONIX) 40 MG tablet Increase to 40 mg twice daily for 10 weeks then reduce to 40 mg once daily. 10/21/20   Thornton Park, MD  ?sertraline (ZOLOFT) 100 MG tablet TAKE 1 & 1/2 (ONE & ONE-HALF) TABLETS BY MOUTH ONCE DAILY 03/19/21   Colon Branch, MD  ?   ? ?Allergies    ?Patient has no known allergies.   ? ?Review of Systems   ?Review of Systems  ?Constitutional:  Negative for chills and fever.  ?Respiratory:  Positive for cough. Negative for shortness of breath.   ?Cardiovascular:  Negative for chest pain.  ?Gastrointestinal:  Positive for abdominal pain, diarrhea, nausea and vomiting. Negative for blood in stool.  ?Genitourinary:  Negative for dysuria.  ?Neurological:  Positive for dizziness and headaches. Negative for syncope, weakness and numbness.  ?All other systems reviewed and are negative. ? ?Physical Exam ?Updated Vital Signs ?BP (!) 207/111   Pulse (!) 103   Temp 98 ?F (36.7 ?C) (Oral)   Resp 17   LMP 05/12/2021 (Exact Date)   SpO2 97%  ?Physical Exam ?Vitals and nursing note reviewed.  ?Constitutional:   ?   General: She is  not in acute distress. ?   Appearance: Normal appearance. She is not toxic-appearing.  ?HENT:  ?   Head: Normocephalic and atraumatic.  ?   Mouth/Throat:  ?   Pharynx: Oropharynx is clear. Uvula midline.  ?Eyes:  ?   General: Vision grossly intact. Gaze aligned appropriately.  ?   Extraocular Movements: Extraocular movements intact.  ?   Conjunctiva/sclera: Conjunctivae normal.  ?   Pupils: Pupils are equal, round, and reactive to light.  ?   Comments: No proptosis.   ?Cardiovascular:  ?   Rate and Rhythm: Regular rhythm. Tachycardia present.  ?Pulmonary:  ?    Effort: Pulmonary effort is normal.  ?   Breath sounds: Normal breath sounds.  ?Abdominal:  ?   General: There is no distension.  ?   Palpations: Abdomen is soft.  ?   Tenderness: There is abdominal tenderness in the epigastric area and left upper quadrant. There is no guarding or rebound.  ?Musculoskeletal:  ?   Cervical back: Normal range of motion and neck supple. No rigidity.  ?Skin: ?   General: Skin is warm and dry.  ?Neurological:  ?   Mental Status: She is alert.  ?   Comments: Alert. Clear speech. No facial droop. CNIII-XII grossly intact. Bilateral upper and lower extremities' sensation grossly intact. 5/5 symmetric strength with grip strength and with plantar and dorsi flexion bilaterally . Normal finger to nose bilaterally. ?  ?Psychiatric:     ?   Mood and Affect: Mood normal.     ?   Behavior: Behavior normal.  ? ? ?ED Results / Procedures / Treatments   ?Labs ?(all labs ordered are listed, but only abnormal results are displayed) ?Labs Reviewed  ?COMPREHENSIVE METABOLIC PANEL - Abnormal; Notable for the following components:  ?    Result Value  ? CO2 19 (*)   ? Glucose, Bld 107 (*)   ? Calcium 8.5 (*)   ? All other components within normal limits  ?CBC - Abnormal; Notable for the following components:  ? WBC 11.1 (*)   ? RBC 5.38 (*)   ? MCV 78.8 (*)   ? MCH 24.9 (*)   ? RDW 16.9 (*)   ? All other components within normal limits  ?C DIFFICILE QUICK SCREEN W PCR REFLEX    ?LIPASE, BLOOD  ?URINALYSIS, ROUTINE W REFLEX MICROSCOPIC  ?I-STAT BETA HCG BLOOD, ED ([facility], [facility], [facility] ONLY)  ? ? ?EKG ?None ? ?Radiology ?CT Head Wo Contrast ? ?Result Date: 05/25/2021 ?CLINICAL DATA:  Headache EXAM: CT HEAD WITHOUT CONTRAST TECHNIQUE: Contiguous axial images were obtained from the base of the skull through the vertex without intravenous contrast. RADIATION DOSE REDUCTION: This exam was performed according to the departmental dose-optimization program which includes automated exposure control, adjustment of the mA and/or  kV according to patient size and/or use of iterative reconstruction technique. COMPARISON:  MRI brain dated 11/07/2019 FINDINGS: Brain: No evidence of acute infarction, hemorrhage, hydrocephalus, extra-axial collection or mass lesion/mass effect. Mild cortical atrophy. Vascular: No hyperdense vessel or unexpected calcification. Skull: Normal. Negative for fracture or focal lesion. Sinuses/Orbits: The visualized paranasal sinuses are essentially clear. The mastoid air cells are unopacified. Other: None. IMPRESSION: Normal head CT. Electronically Signed   By: Julian Hy M.D.   On: 05/25/2021 02:21  ? ?CT Abdomen Pelvis W Contrast ? ?Result Date: 05/25/2021 ?CLINICAL DATA:  Abdominal pain EXAM: CT ABDOMEN AND PELVIS WITH CONTRAST TECHNIQUE: Multidetector CT imaging of the abdomen and pelvis was performed  using the standard protocol following bolus administration of intravenous contrast. RADIATION DOSE REDUCTION: This exam was performed according to the departmental dose-optimization program which includes automated exposure control, adjustment of the mA and/or kV according to patient size and/or use of iterative reconstruction technique. CONTRAST:  166mL OMNIPAQUE IOHEXOL 300 MG/ML  SOLN COMPARISON:  03/27/2021 FINDINGS: Lower chest: Lung bases are essentially clear. Hepatobiliary: Small hepatic cysts, measuring up to 12 mm in the right hepatic dome (series 1/image 13). Status post cholecystectomy. No intrahepatic or extrahepatic ductal dilatation. Pancreas: Within normal limits. Spleen: Within normal limits. Adrenals/Urinary Tract: Adrenal glands are within limits. Right renal cortical scarring/atrophy. Bilateral renal cysts, measuring up to 17 mm in the left lower kidney. Two nonobstructing left renal calculi measuring 2-3 mm (series 1/images 36 and 42), unchanged. No hydronephrosis. Bladder is within normal limits. Stomach/Bowel: Stomach is within normal limits. No evidence of bowel obstruction. Prior  appendectomy. No colonic wall thickening or inflammatory changes. Vascular/Lymphatic: No evidence of abdominal aortic aneurysm. No suspicious abdominopelvic lymphadenopathy. Reproductive: Uterus is within normal li

## 2021-05-25 NOTE — ED Notes (Signed)
Pt drank water, stated she feels fine and doesn't not feel nauseous  ?

## 2021-05-25 NOTE — Discharge Instructions (Addendum)
You were seen in the emergency department for vomiting, diarrhea, headache, and abdominal pain.  Your CT scans were reassuring.  You do have some stones within your kidney, we do not suspect that these are causing any of your symptoms wanted to mention these to that you are aware.  Your blood work was overall reassuring. ? ?We are sending home with the following medicines: ?- Zofran: Take every 8 hours as needed for nausea and vomiting ?- Pepcid: Take every 12 hours as needed for abdominal pain ?-Bentyl: Take every 8 hours as needed for abdominal cramping. ? ?Please take Tylenol per over-the-counter dosing to help with headaches. ? ?Follow attached diet guidelines.  Please drink plenty of electrolyte containing fluids.  Follow-up with primary care within 3 days.  Return to the ER for new or worsening symptoms including but not limited to new or worsening pain, fever, inability to keep fluids down, numbness, weakness, passing out, trouble walking, blood in your vomit or stool, or any other concerns. ?

## 2021-05-26 ENCOUNTER — Telehealth: Payer: Self-pay

## 2021-05-26 NOTE — Telephone Encounter (Signed)
Transition Care Management Unsuccessful Follow-up Telephone Call ? ?Date of discharge and from where:  05/25/2021 from [facility] ? ?Attempts:  1st Attempt ? ?Reason for unsuccessful TCM follow-up call:  Left voice message ? ? ? ?

## 2021-05-27 NOTE — Telephone Encounter (Signed)
Transition Care Management Unsuccessful Follow-up Telephone Call ? ?Date of discharge and from where:  05/25/2021 from [facility] ? ?Attempts:  2nd Attempt ? ?Reason for unsuccessful TCM follow-up call:  Left voice message ? ? ? ?

## 2021-05-29 NOTE — Telephone Encounter (Signed)
Transition Care Management Unsuccessful Follow-up Telephone Call  Date of discharge and from where:  05/25/2021 from [facility]  Attempts:  3rd Attempt  Reason for unsuccessful TCM follow-up call:  Unable to reach patient

## 2021-06-01 DIAGNOSIS — S83242A Other tear of medial meniscus, current injury, left knee, initial encounter: Secondary | ICD-10-CM | POA: Diagnosis not present

## 2021-06-05 ENCOUNTER — Ambulatory Visit: Payer: Medicaid Other | Admitting: Gastroenterology

## 2021-06-08 ENCOUNTER — Encounter: Payer: Self-pay | Admitting: Internal Medicine

## 2021-06-09 ENCOUNTER — Ambulatory Visit
Admission: RE | Admit: 2021-06-09 | Discharge: 2021-06-09 | Disposition: A | Discharge status: Home / Self Care | Payer: Medicaid Other | Source: Ambulatory Visit | Attending: Urology | Admitting: Urology

## 2021-06-09 DIAGNOSIS — N281 Cyst of kidney, acquired: Secondary | ICD-10-CM

## 2021-06-09 DIAGNOSIS — K7689 Other specified diseases of liver: Secondary | ICD-10-CM | POA: Diagnosis not present

## 2021-06-09 DIAGNOSIS — Z9049 Acquired absence of other specified parts of digestive tract: Secondary | ICD-10-CM | POA: Diagnosis not present

## 2021-06-09 MED ORDER — GADOBENATE DIMEGLUMINE 529 MG/ML IV SOLN
20.0000 mL | Freq: Once | INTRAVENOUS | Status: AC | PRN
  Administered 2021-06-09: 20 mL via INTRAVENOUS

## 2021-06-09 MED ORDER — SERTRALINE HCL 100 MG PO TABS: 150.0000 mg | ORAL_TABLET | Freq: Every day | ORAL | 1 refills | Status: DC

## 2021-06-16 ENCOUNTER — Other Ambulatory Visit: Payer: Medicaid Other

## 2021-06-16 DIAGNOSIS — M25562 Pain in left knee: Secondary | ICD-10-CM | POA: Diagnosis not present

## 2021-06-17 ENCOUNTER — Ambulatory Visit: Payer: Medicaid Other | Admitting: Internal Medicine

## 2021-06-17 ENCOUNTER — Ambulatory Visit (INDEPENDENT_AMBULATORY_CARE_PROVIDER_SITE_OTHER): Payer: Medicaid Other

## 2021-06-17 ENCOUNTER — Encounter: Payer: Self-pay | Admitting: Internal Medicine

## 2021-06-17 DIAGNOSIS — N200 Calculus of kidney: Secondary | ICD-10-CM | POA: Diagnosis not present

## 2021-06-17 DIAGNOSIS — R053 Chronic cough: Secondary | ICD-10-CM

## 2021-06-17 DIAGNOSIS — R059 Cough, unspecified: Secondary | ICD-10-CM | POA: Diagnosis not present

## 2021-06-17 DIAGNOSIS — N281 Cyst of kidney, acquired: Secondary | ICD-10-CM | POA: Diagnosis not present

## 2021-06-17 MED ORDER — BUDESONIDE-FORMOTEROL FUMARATE 80-4.5 MCG/ACT IN AERO: INHALATION_SPRAY | RESPIRATORY_TRACT | 12 refills | Status: DC

## 2021-06-17 MED ORDER — BENZONATATE 200 MG PO CAPS: 200.0000 mg | ORAL_CAPSULE | Freq: Three times a day (TID) | ORAL | 1 refills | Status: DC | PRN

## 2021-06-17 NOTE — Progress Notes (Signed)
Subjective:    Patient ID: Stephanie Davies, female    DOB: 1975-04-02   MRN: [id number]    Brief patient profile:  46  yo [name] smoker immigrated in 1999 with first term baby Aug 2015  With onset during the last month of IUP= chest tight, sob and feels like rock on chest no change at all p baby born referred by Dr Ainsley Spinner to pulmonary clinic 01/21/2014    History of Present Illness  01/21/2014 1st [facility] office visit/ Haydon Kalmar   Chief Complaint  Patient presents with   Pulmonary Consult    Referred by Dr. Ila Mcgill. [name]  She states "feels like a rock in my chest".    ex tol not back to normal since delivery with doe x more than slow adls Cough did not start until  early dec 2015 day > noct,  ? Better immediately p  Qvar, dry   Better if stays up >  30 degrees when supine Symptoms were are insidious, pattern is daily, noct, not really progressive but certainly persistent Has saba but never took out of box  Prednisone 10 mg take  4 each am x 2 days,   2 each am x 2 days,  1 each am x 2 days and stop  Stop qvar for now  Pantoprazole (protonix) 40 mg   Take 30-60 min before first meal of the day and Pepcid 20 mg one bedtime along with chlortrimeton 4 mg at bedtime until you return     02/18/2014 f/u ov/Arsalan Brisbin re: unexplained cp/sob/cough post partum, no longer nursing  Chief Complaint  Patient presents with   Follow-up    pt c/o sharp chest tightness Xfew days, esophageal tightness.  still cannot lay flat.    cough came on p chest tightness and sob and is now completely gone x 2 weeks s need for cough meds  Breathing much better and rare if ever  need for saba Chest tightness gone New cp center of chest x two weeks prior to OV  Diffuse mostly midline rad to back and does not hurt to take deep breath/ no change with meals but much worse when trieds to lie flat > w/u underway by Dr Larose Kells   Rec F/u prn     06/17/2021  Re-establish  ov/Earland Reish re: recurrent cough  x 5 months Jan 2023 onset with uri / neg covid testing  Chief Complaint  Patient presents with   Consult    Pt states she has had a chronic cough that is worse at night but states there will be times when she will be fine and then all of a sudden she will start to cough.  Dyspnea: 10 min walking  then cough stops her, not really doe  Cough: rx abx/inhaler/pred  helped a lot but still present/ cough immediately when stirs, not while sleeping assoc with sense of pnds  Peaks around 1 pm and after supper / off ppi x  2 weeks no worse Sleeping: bed is electric about 10 degrees  SABA use: symbicort 80 helped the most  02: none Covid status:   vax full  LLower abd pain worse with  coughing no better on ppi and bid h2 no worse off it , worse hs  Pnds x 2013 esp very cold or very hot    No obvious day to day or daytime variability or assoc excess/ purulent  sputum or mucus plugs or hemoptysis or cp or chest tightness, subjective wheeze or overt sinus or hb symptoms.   Sleeping  without nocturnal   exacerbation  of respiratory  c/o's or need for noct saba. Also denies any obvious fluctuation of symptoms with weather or environmental changes or other aggravating or alleviating factors except as outlined above   No unusual exposure hx or h/o childhood pna/ asthma or knowledge of premature birth.  Current Allergies, Complete Past Medical History, Past Surgical History, Family History, and Social History were reviewed in [facility] electronic medical record.  ROS  The following are not active complaints unless bolded Hoarseness, sore throat, dysphagia, dental problems, itching, sneezing,  nasal congestion or discharge of excess mucus or purulent secretions, ear ache,   fever, chills, sweats, unintended wt loss or wt gain, classically pleuritic or exertional cp,  orthopnea pnd or arm/hand swelling  or leg swelling, presyncope, palpitations,  abdominal pain, anorexia, nausea, vomiting, diarrhea  or change in bowel habits or change in bladder habits, change in stools or change in urine, dysuria, hematuria,  rash, arthralgias, visual complaints, headache, numbness, weakness or ataxia or problems with walking or coordination,  change in mood or  memory.        Current Meds  Medication Sig   budesonide-formoterol (SYMBICORT) 80-4.5 MCG/ACT inhaler Inhale 2 puffs into the lungs 2 (two) times daily.   dicyclomine (BENTYL) 20 MG tablet Take 1 tablet (20 mg total) by mouth every 8 (eight) hours as needed for spasms (abdominal pain).   famotidine (PEPCID) 20 MG tablet Take 1 tablet (20 mg total) by mouth 2 (two) times daily as needed for heartburn or indigestion (abdominal pain).   hyoscyamine (LEVSIN SL) 0.125 MG SL tablet Place 1 tablet (0.125 mg total) under the tongue every 4 (four) hours as needed.   linaclotide (LINZESS) 145 MCG CAPS capsule Take 1 capsule (145 mcg total) by mouth daily before breakfast.   ondansetron (ZOFRAN-ODT) 4 MG disintegrating tablet Take 1 tablet (4 mg total) by mouth every 8 (eight) hours as needed for nausea or vomiting.   pantoprazole (PROTONIX) 40 MG tablet Increase to 40 mg twice daily for 10 weeks then reduce to 40 mg once daily.   sertraline (ZOLOFT) 100 MG tablet Take 1.5 tablets (150 mg total) by mouth daily.            Objective:   Physical Exam    06/17/2021          290 02/18/2014          258   01/21/14 254 lb (115.214 kg)  01/09/14 256 lb 9.6 oz (116.393 kg)  11/15/13 248 lb 12.8 oz (112.855 kg)    Vital signs reviewed  06/17/2021  - Note at rest 02 sats  98% on RA   General appearance:    obese bf variable cough on isnpiration and FVC   HEENT : Oropharynx  clear     Nasal turbinates nl    NECK :  without  appent JVD/ palpable Nodes/TM    LUNGS: no acc muscle use,  Nl contour chest which is clear to A and P bilaterally without cough on insp or exp maneuvers   CV:  RRR  no s3 or  murmur or increase in P2, and no edema   ABD:  soft and nontender with nl inspiratory excursion in the supine position. No bruits or organomegaly appreciated   MS:  Nl gait/ ext warm without deformities Or obvious joint  restrictions  calf tenderness, cyanosis or clubbing    SKIN: warm and dry without lesions    NEURO:  alert, approp, nl sensorium with  no motor or cerebellar deficits apparent.    CXR PA and Lateral:   06/17/2021 :    I personally reviewed images and impression is as follows:     No active dz     Assessment & Plan:

## 2021-06-17 NOTE — Assessment & Plan Note (Addendum)
Resolved in 2016 on gerd rx and off all inhalers - recurred Jan 2023 responded best to symbicort 80 - Allergy screen 06/17/2021 >  Eos 0. /  IgE   -  06/17/2021 trial of just symbicort 80 2bid and 1st gen H1 blockers per guidelines   The most common causes of chronic cough in immunocompetent adults include the following: upper airway cough syndrome (UACS), previously referred to as postnasal drip syndrome (PNDS), which is caused by variety of rhinosinus conditions; (2) asthma; (3) GERD; (4) chronic bronchitis from cigarette smoking or other inhaled environmental irritants; (5) nonasthmatic eosinophilic bronchitis; and (6) bronchiectasis.   These conditions, singly or in combination, have accounted for up to 94% of the causes of chronic cough in prospective studies.   Other conditions have constituted no >6% of the causes in prospective studies These have included bronchogenic carcinoma, chronic interstitial pneumonia, sarcoidosis, left ventricular failure, ACEI-induced cough, and aspiration from a condition associated with pharyngeal dysfunction.    Chronic cough is often simultaneously caused by more than one condition. A single cause has been found from 38 to 82% of the time, multiple causes from 18 to 62%. Multiple caused cough has been the result of three diseases up to 42% of the time.   Suspect this is combination of cough variant asthma and Upper airway cough syndrome (previously labeled PNDS),  is so named because it's frequently impossible to sort out how much is  CR/sinusitis with freq throat clearing (which can be related to primary GERD)   vs  causing  secondary (" extra esophageal")  GERD from wide swings in gastric pressure that occur with throat clearing, often  promoting self use of mint and menthol lozenges that reduce the lower esophageal sphincter tone and exacerbate the problem further in a cyclical fashion.   These are the same pts (now being labeled as having "irritable larynx  syndrome" by some cough centers) who not infrequently have a history of having failed to tolerate ace inhibitors,  dry powder inhalers or biphosphonates or report having atypical/extraesophageal reflux symptoms that don't respond to standard doses of PPI  and are easily confused as having aecopd or asthma flares by even experienced allergists/ pulmonologists (myself included).   rec symbicort 80 2bid 1st gen H1 blockers per guidelines   Gerd diet but no acid suppressio for now tesssilon 200 mg tid prn   - The proper method of use, as well as anticipated side effects, of a metered-dose inhaler were discussed and demonstrated to the patient using teach back method    F/u when returns from [place].         Each maintenance medication was reviewed in detail including emphasizing most importantly the difference between maintenance and prns and under what circumstances the prns are to be triggered using an action plan format where appropriate.  Total time for H and P, chart review, counseling, reviewing hfa device(s) and generating customized AVS unique to this office visit / same day charting  > 45 min for multiple  refractory respiratory  symptoms of uncertain etiology

## 2021-06-17 NOTE — Patient Instructions (Addendum)
GERD (REFLUX)  is an extremely common cause of respiratory symptoms just like yours , many times with no obvious heartburn at all.    It can be treated with medication, but also with lifestyle changes including elevation of the head of your bed (ideally with 6 -8inch blocks under the headboard of your bed),  Smoking cessation, avoidance of late meals, excessive alcohol, and avoid fatty foods, chocolate, peppermint, colas, red wine, and acidic juices such as orange juice.  NO MINT OR MENTHOL PRODUCTS SO NO COUGH DROPS  USE SUGARLESS CANDY INSTEAD (Jolley ranchers or Stover's or Life Savers) or even ice chips will also do - the key is to swallow to prevent all throat clearing. NO OIL BASED VITAMINS - use powdered substitutes.  Avoid fish oil when coughing.    Symbicort 80 Take 2 puffs first thing in am and then another 2 puffs about 12 hours later.    Work on inhaler technique:  relax and gently blow all the way out then take a nice smooth full deep breath back in, triggering the inhaler at same time you start breathing in.  Hold for up to 5 seconds if you can. Blow out thru nose. Rinse and gargle with water when done.  If mouth or throat bother you at all,  try brushing teeth/gums/tongue with arm and hammer toothpaste/ make a slurry and gargle and spit out.    For drainage / throat tickle try take CHLORPHENIRAMINE  4 mg  ("Allergy Relief" 4mg   at [facility] should be easiest to find in the blue box usually on bottom shelf)  take one every 4 hours as needed - extremely effective and inexpensive over the counter- may cause drowsiness so start with just a dose or two an hour before bedtime and see how you tolerate it before trying in daytime.   For cough > tessalon 200 mg every 6 hours as needed   Return to clinic when you are back from [facility] and bring your inhaler          .

## 2021-06-18 LAB — CBC WITH DIFFERENTIAL/PLATELET
Basophils Absolute: 0.1 10*3/uL (ref 0.0–0.1)
Basophils Relative: 0.9 % (ref 0.0–3.0)
Eosinophils Absolute: 0.2 10*3/uL (ref 0.0–0.7)
Eosinophils Relative: 2 % (ref 0.0–5.0)
HCT: 38.7 % (ref 36.0–46.0)
Hemoglobin: 12.4 g/dL (ref 12.0–15.0)
Lymphocytes Relative: 13.9 % (ref 12.0–46.0)
Lymphs Abs: 1.6 10*3/uL (ref 0.7–4.0)
MCHC: 32.1 g/dL (ref 30.0–36.0)
MCV: 77.5 fl — ABNORMAL LOW (ref 78.0–100.0)
Monocytes Absolute: 0.5 10*3/uL (ref 0.1–1.0)
Monocytes Relative: 4.1 % (ref 3.0–12.0)
Neutro Abs: 8.9 10*3/uL — ABNORMAL HIGH (ref 1.4–7.7)
Neutrophils Relative %: 79.1 % — ABNORMAL HIGH (ref 43.0–77.0)
Platelets: 323 10*3/uL (ref 150.0–400.0)
RBC: 4.99 Mil/uL (ref 3.87–5.11)
RDW: 17.3 % — ABNORMAL HIGH (ref 11.5–15.5)
WBC: 11.2 10*3/uL — ABNORMAL HIGH (ref 4.0–10.5)

## 2021-06-18 LAB — IGE: IgE (Immunoglobulin E), Serum: 396 kU/L — ABNORMAL HIGH (ref <=114)

## 2021-06-19 ENCOUNTER — Telehealth: Payer: Self-pay | Admitting: Internal Medicine

## 2021-06-19 DIAGNOSIS — S83242D Other tear of medial meniscus, current injury, left knee, subsequent encounter: Secondary | ICD-10-CM | POA: Diagnosis not present

## 2021-06-19 NOTE — Telephone Encounter (Signed)
Stephanie Cowden, MD  06/18/2021  4:08 PM EDT     Call patient :  Stephanie Davies is .c/w mod allergy so if not better with present rx consider formal allergy eval with [facility] at her convenience (going to [place] for 2 months soon and it can certainly wait )     Called and spoke with pt letting her know the results of bloodwork per MW and she verbalized understanding. Pt wants to wait until she returns from [place] and have f/u with Dr. Sherene Sires before we place referral to allergy. Nothing further needed.

## 2021-07-17 IMAGING — CT CT ABD-PELV W/ CM
2 of 5 series · 16 of 46 positions shown, 18 images · IV contrast (OMNIPAQUE)
Comparison: 11/03/2019

CLINICAL DATA: Left lower quadrant pain for 2-3 months

EXAM:
CT ABDOMEN AND PELVIS WITH CONTRAST
TECHNIQUE: Multidetector CT imaging of the abdomen and pelvis was performed
using the standard protocol following bolus administration of
intravenous contrast.
CONTRAST:  100mL OMNIPAQUE IOHEXOL 300 MG/ML  SOLN

[Series 4: axial st · axial · 0.76mm/px · z∈[-734,-320]mm · 13 of 95 slices shown, 15 images]
[im 6/95  soft-tissue]
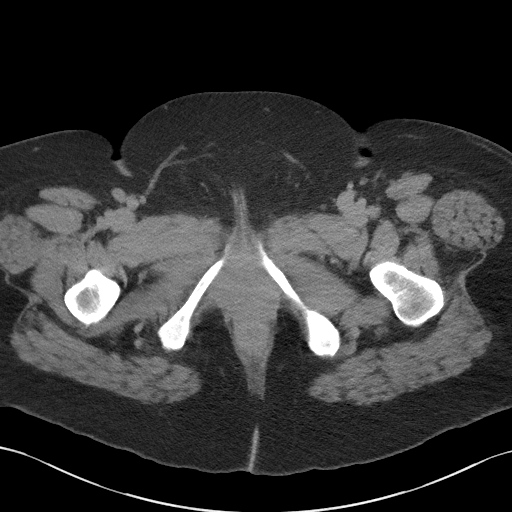
[im 6/95  bone]
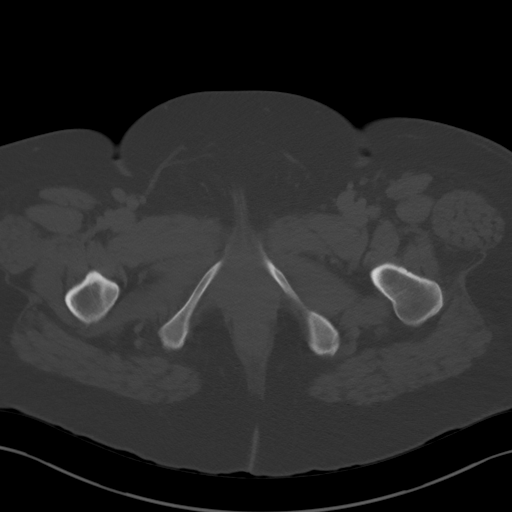
[im 12/95  soft-tissue]
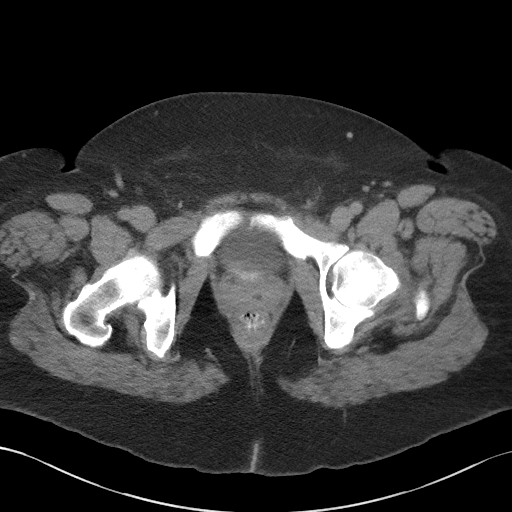
[im 23/95  soft-tissue]
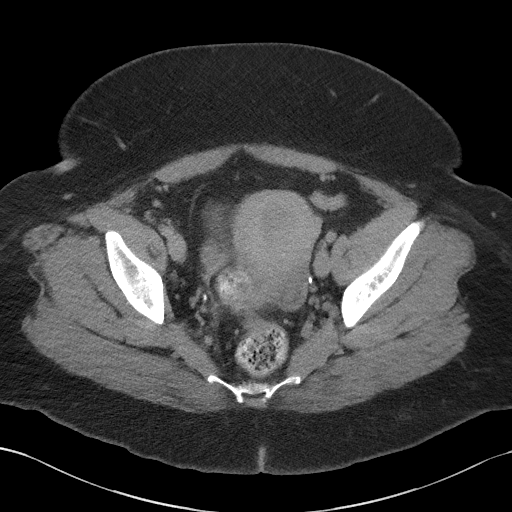
[im 28/95  soft-tissue]
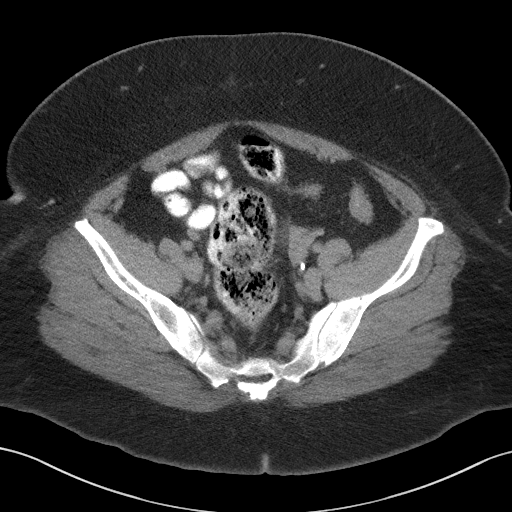
[im 34/95  soft-tissue]
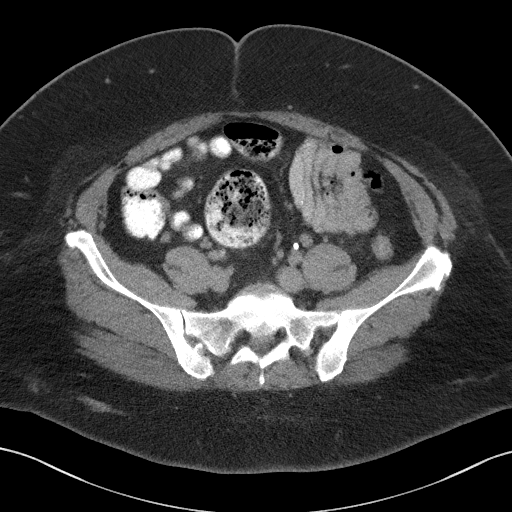
[im 39/95  soft-tissue]
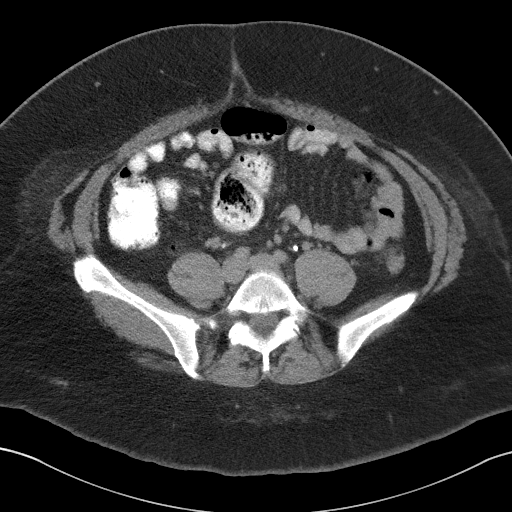
[im 50/95  soft-tissue]
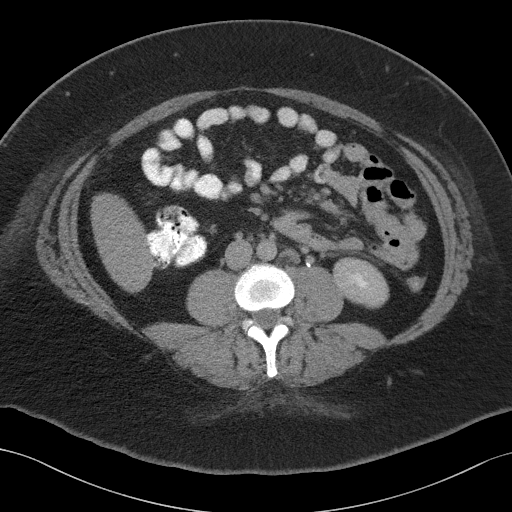
[im 56/95  soft-tissue]
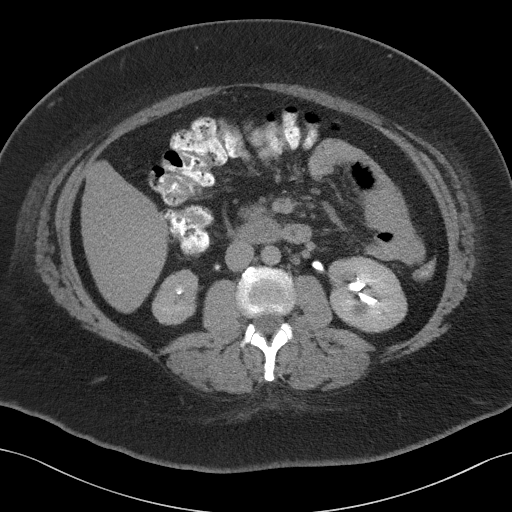
[im 61/95  soft-tissue]
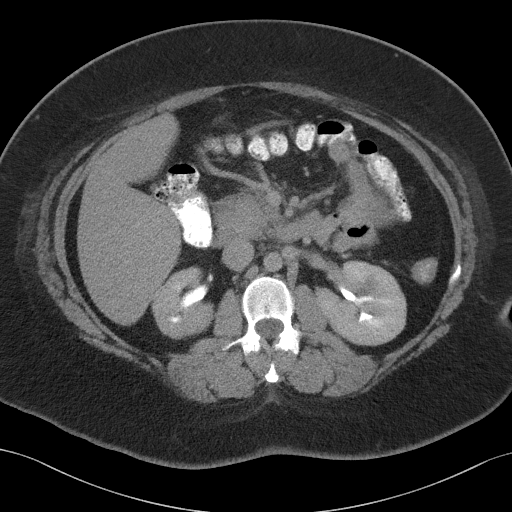
[im 61/95  bone]
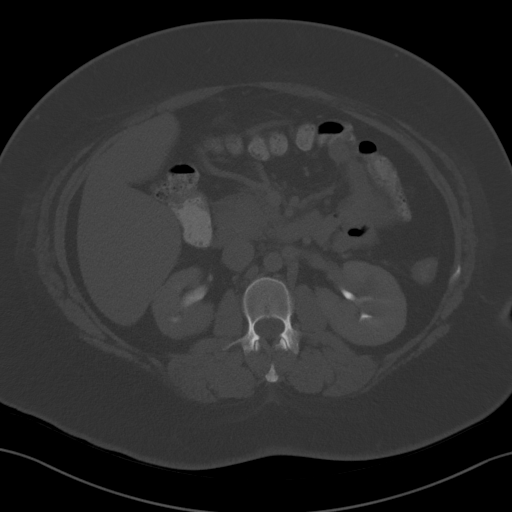
[im 67/95  soft-tissue]
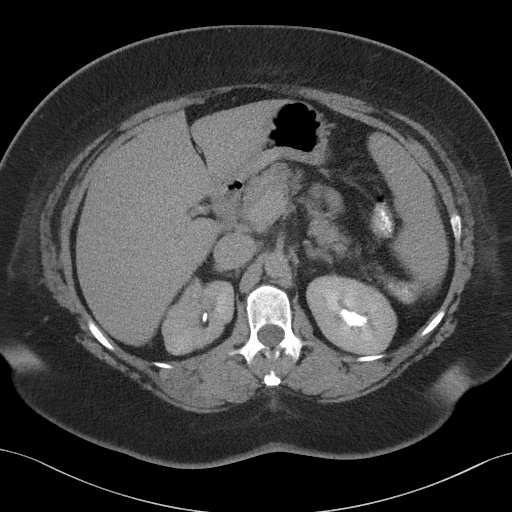
[im 72/95  soft-tissue]
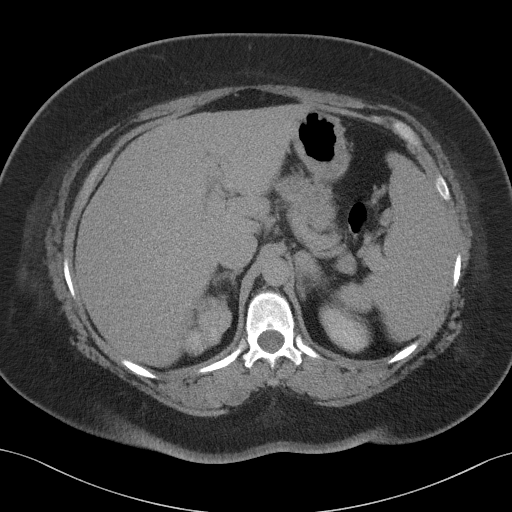
[im 83/95  soft-tissue]
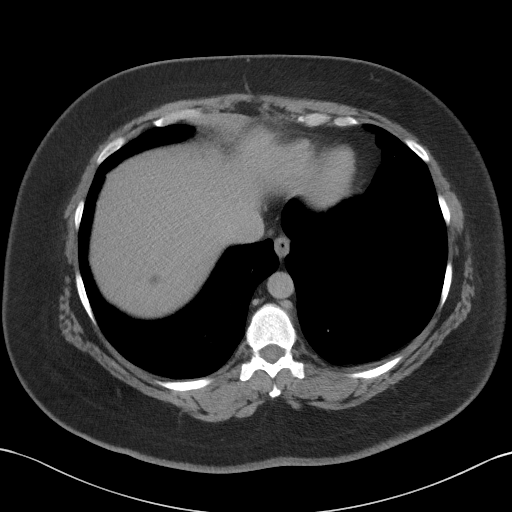
[im 89/95  soft-tissue]
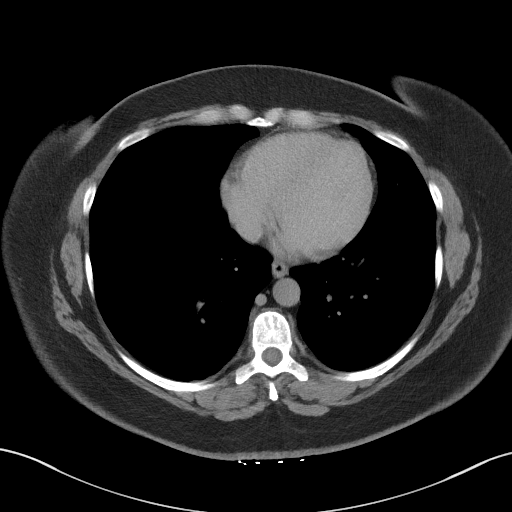

[Series 6: coronal st · coronal · 0.90mm/px · 3 of 109 slices shown]
[im 37/109  soft-tissue]
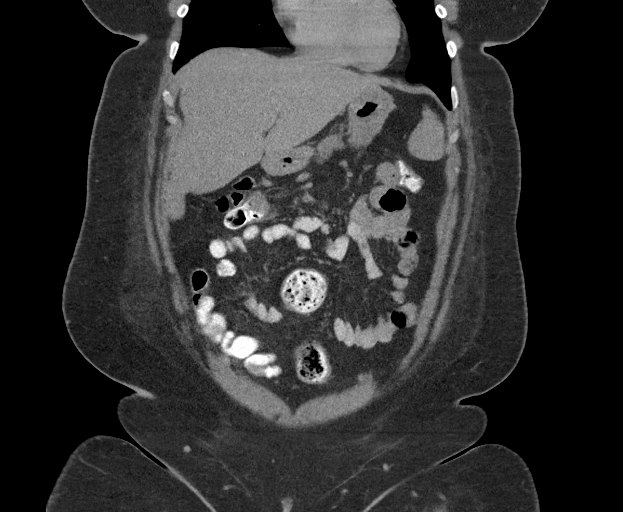
[im 49/109  soft-tissue]
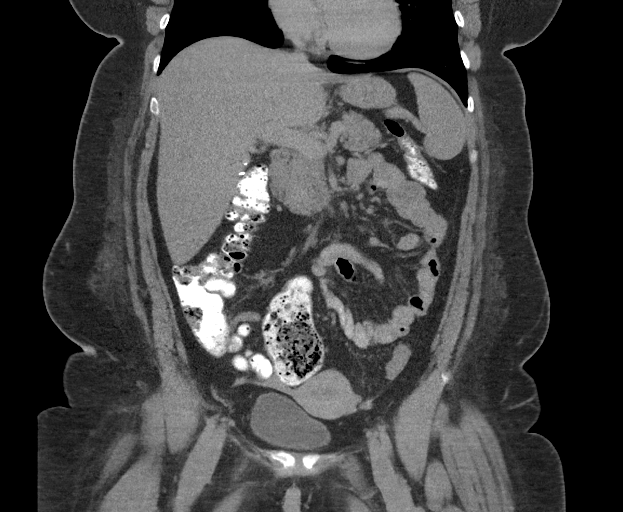
[im 61/109  soft-tissue]
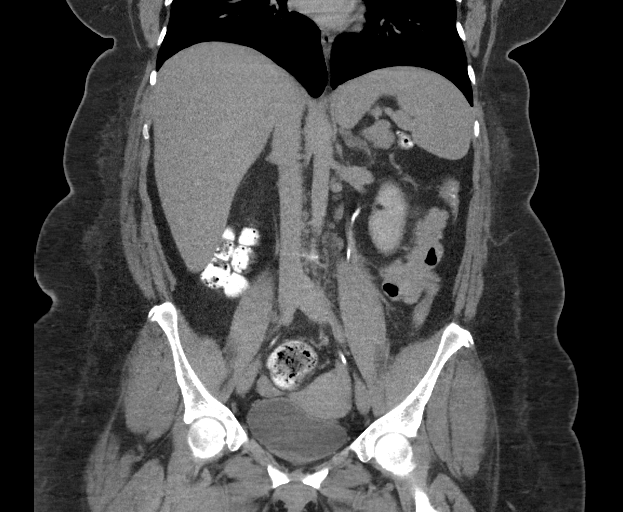

[16 of 46 positions shown; findings below may reference images not displayed]

FINDINGS: Lower chest: No acute abnormality.

Hepatobiliary: 11 mm hypodense, fluid attenuating right hepatic mass
most consistent with a cyst. Liver is otherwise unremarkable. Status
post cholecystectomy. No biliary dilatation.

Pancreas: Unremarkable. No pancreatic ductal dilatation or
surrounding inflammatory changes.

Spleen: Normal in size without focal abnormality.

Adrenals/Urinary Tract: Normal adrenal glands. 11 mm hypodense,
fluid attenuating left renal mass with a small peripheral mural
calcification likely reflecting a mildly complicated cyst unchanged
compared with 11/03/2019. Stable 13 mm right renal cyst. Right renal
scarring. No obstructive uropathy. No urolithiasis.

Stomach/Bowel: Stomach is within normal limits. Appendix appears
normal. No evidence of bowel wall thickening, distention, or
inflammatory changes. Moderate amount of stool throughout the colon.

Vascular/Lymphatic: No significant vascular findings are present. No
enlarged abdominal or pelvic lymph nodes.

Reproductive: Uterus and bilateral adnexa are unremarkable.

Other: No abdominal wall hernia or abnormality. No abdominopelvic
ascites.

Musculoskeletal: No acute or significant osseous findings.
IMPRESSION: 1. No acute intra-abdominal or pelvic pathology.
2. Bilateral renal cysts.

## 2021-08-12 NOTE — H&P (Signed)
PREOPERATIVE H&P  Chief Complaint: LEFT KNEE MEDIAL & LATERAL MENISCUS TEAR, CHONDROMALACIA PATELLA  HPI: Stephanie Davies is a 46 y.o. female who presents with a diagnosis of LEFT KNEE MEDIAL & LATERAL MENISCUS TEAR, CHONDROMALACIA PATELLA. Symptoms are rated as moderate to severe, and have been worsening.  This is significantly impairing activities of daily living.  She has elected for surgical management.   Past Medical History:  Diagnosis Date   Alopecia    Anal fistula    Anxiety and depression 03/07/2014   COVID    GERD (gastroesophageal reflux disease) 02/14/2014   Goiter 03/07/2014   Headache(784.0)    Hemorrhoids    Kidney stone    Pseudotumor cerebri    Past Surgical History:  Procedure Laterality Date   CHOLECYSTECTOMY  2004   kidney stent: 12/2018 at [facility]     LAPAROSCOPIC APPENDECTOMY N/A 03/05/2020   Procedure: APPENDECTOMY LAPAROSCOPIC;  Surgeon: Quentin Ore, MD;  Location: [facility];  Service: General;  Laterality: N/A;   Social History   Socioeconomic History   Marital status: Married    Spouse name: Not on file   Number of children: 1   Years of education: Not on file   Highest education level: Bachelor's degree (e.g., BA, AB, BS)  Occupational History   Occupation: stay home  Tobacco Use   Smoking status: Never   Smokeless tobacco: Never  Vaping Use   Vaping Use: Never used  Substance and Sexual Activity   Alcohol use: No   Drug use: No   Sexual activity: Not Currently    Birth control/protection: None  Other Topics Concern   Not on file  Social History Narrative   Original from from [place]   Lives w/ mother, husband overseas ([place]   Student-- [employer]   G1P1, daughter 68   Social Determinants of Health   Financial Resource Strain: Not on file  Food Insecurity: Not on file  Transportation Needs: Not on file  Physical Activity: Not on file  Stress: Not on file  Social Connections: Not on file   Family History  Problem  Relation Age of Onset   Diabetes Mother    Hypertension Mother    Esophageal cancer Maternal Grandmother    Esophageal cancer Maternal Grandfather    Diabetes Maternal Grandfather    Colon cancer Neg Hx    Colon polyps Neg Hx    Gallbladder disease Neg Hx    Kidney disease Neg Hx    No Known Allergies Prior to Admission medications   Medication Sig Start Date End Date Taking? Authorizing Provider  benzonatate (TESSALON) 200 MG capsule Take 1 capsule (200 mg total) by mouth 3 (three) times daily as needed for cough. 06/17/21   Nyoka Cowden, MD  budesonide-formoterol (SYMBICORT) 80-4.5 MCG/ACT inhaler Take 2 puffs first thing in am and then another 2 puffs about 12 hours later. 06/17/21   Nyoka Cowden, MD  dicyclomine (BENTYL) 20 MG tablet Take 1 tablet (20 mg total) by mouth every 8 (eight) hours as needed for spasms (abdominal pain). 05/25/21   Petrucelli, Samantha R, PA-C  hyoscyamine (LEVSIN SL) 0.125 MG SL tablet Place 1 tablet (0.125 mg total) under the tongue every 4 (four) hours as needed. 04/24/21   Tressia Danas, MD  linaclotide [facility]) 145 MCG CAPS capsule Take 1 capsule (145 mcg total) by mouth daily before breakfast. 03/12/21   Doree Albee, PA-C  ondansetron (ZOFRAN-ODT) 4 MG disintegrating tablet Take 1 tablet (4 mg total)  by mouth every 8 (eight) hours as needed for nausea or vomiting. 05/25/21   Petrucelli, Lelon Mast R, PA-C  sertraline (ZOLOFT) 100 MG tablet Take 1.5 tablets (150 mg total) by mouth daily. 06/09/21   Wanda Plump, MD     Positive ROS: All other systems have been reviewed and were otherwise negative with the exception of those mentioned in the HPI and as above.  Physical Exam: General: Alert, no acute distress Cardiovascular: No pedal edema Respiratory: No cyanosis, no use of accessory musculature GI: No organomegaly, abdomen is soft and non-tender Skin: No lesions in the area of chief complaint Neurologic: Sensation intact distally Psychiatric:  Patient is competent for consent with normal mood and affect Lymphatic: No axillary or cervical lymphadenopathy  MUSCULOSKELETAL: +TTP medial and lateral joint lines, ROM intact, good strength, + McMurrays test, + Thessaly's test, NVI   Imaging: MRI of the left knee shows tear of the central third of the meniscal triangle of the medial meniscus and tear at the peripheral inferior aspect of the meniscal triangle with extrusion into the medial gutter, possible linear tear extending through the anterior wall of the anterior horn of the lateral meniscus, and moderate patellar and medial tibiofemoral OA   Assessment: LEFT KNEE MEDIAL & LATERAL MENISCUS TEAR, CHONDROMALACIA PATELLA  Plan: Plan for Procedure(s): KNEE ARTHROSCOPY WITH MEDIAL MENISECTOMY KNEE ARTHROSCOPY WITH LATERAL MENISECTOMY CHONDROPLASTY  The risks benefits and alternatives were discussed with the patient including but not limited to the risks of nonoperative treatment, versus surgical intervention including infection, bleeding, nerve injury,  blood clots, cardiopulmonary complications, morbidity, mortality, among others, and they were willing to proceed.   Weightbearing: WBAT LLE Orthopedic devices: none Showering: POD 3 Dressing: reinforce PRN Medicines: ASA, Norco, Celebrex, Zofran  Discharge: home Follow up: 2 weeks    Marzetta Board Office [phone] 08/12/2021 7:24 PM

## 2021-08-21 ENCOUNTER — Encounter (HOSPITAL_BASED_OUTPATIENT_CLINIC_OR_DEPARTMENT_OTHER): Payer: Self-pay | Admitting: Orthopedic Surgery

## 2021-08-25 NOTE — Progress Notes (Signed)
Unable to reach pt to complete pre-op interview for surgery on 09-01-2021 for Dr Eulah Pont @ [facility].  Wilfrid Lund, [place] scheduler for Dr Eulah Pont, to verify phone number since pt phone "can not complete call".  Tresa Endo stated we had the correct number but did give emergency contact as pt's brother with is phone number.  Called and spoke w/ pt's brother, Tasia Catchings, he stated that pt is out of the country and returning on 09-01-2021 DOS. Brother stated he would call for his sister and rescheduled surgery, number for OR scheduler given.

## 2021-09-03 ENCOUNTER — Encounter: Payer: Self-pay | Admitting: Internal Medicine

## 2021-09-28 ENCOUNTER — Encounter (HOSPITAL_BASED_OUTPATIENT_CLINIC_OR_DEPARTMENT_OTHER): Payer: Self-pay | Admitting: Orthopedic Surgery

## 2021-10-06 ENCOUNTER — Encounter (HOSPITAL_BASED_OUTPATIENT_CLINIC_OR_DEPARTMENT_OTHER): Payer: Self-pay | Admitting: Orthopedic Surgery

## 2021-10-06 NOTE — Progress Notes (Signed)
Spoke w/ via phone for pre-op interview--- pt Lab needs dos----  urine preg            Lab results------ no COVID test -----patient states asymptomatic no test needed Arrive at ------- 0530 on 10-13-2021 NPO after MN NO Solid Food.  Clear liquids from MN until--- 0430 Med rec completed Medications to take morning of surgery ----- symbicort inhaler Diabetic medication ----- n/a Patient instructed no nail polish to be worn day of surgery Patient instructed to bring photo id and insurance card day of surgery Patient aware to have Driver (ride ) / caregiver for 24 hours after surgery --- brother, ahmed Patient Special Instructions ----- n/a Pre-Op special Istructions ----- n/a Patient verbalized understanding of instructions that were given at this phone interview. Patient denies shortness of breath, chest pain, fever, cough at this phone interview.

## 2021-10-13 ENCOUNTER — Ambulatory Visit (HOSPITAL_BASED_OUTPATIENT_CLINIC_OR_DEPARTMENT_OTHER): Admission: RE | Admit: 2021-10-13 | Payer: Medicaid Other | Source: Home / Self Care | Admitting: Orthopedic Surgery

## 2021-10-13 DIAGNOSIS — Z01818 Encounter for other preprocedural examination: Secondary | ICD-10-CM

## 2021-10-13 HISTORY — DX: Unspecified tear of unspecified meniscus, current injury, left knee, initial encounter: S83.207A

## 2021-10-13 HISTORY — DX: Depression, unspecified: F32.A

## 2021-10-13 HISTORY — DX: Other specified cough: R05.8

## 2021-10-13 HISTORY — DX: Personal history of other diseases of the digestive system: Z87.19

## 2021-10-13 HISTORY — DX: Personal history of urinary calculi: Z87.442

## 2021-10-13 HISTORY — DX: Nontoxic multinodular goiter: E04.2

## 2021-10-13 HISTORY — DX: Other chronic pain: G89.29

## 2021-10-13 HISTORY — DX: Personal history of peptic ulcer disease: Z87.11

## 2021-10-13 HISTORY — DX: Personal history of benign neoplasm of the brain: Z86.011

## 2021-10-13 HISTORY — DX: Cyst of kidney, acquired: N28.1

## 2021-10-13 HISTORY — DX: Headache, unspecified: R51.9

## 2021-10-13 HISTORY — DX: Dermatitis, unspecified: L30.9

## 2021-10-13 HISTORY — DX: Irritable bowel syndrome with constipation: K58.1

## 2021-10-13 HISTORY — DX: Unspecified osteoarthritis, unspecified site: M19.90

## 2021-10-13 HISTORY — DX: Personal history of other diseases of the nervous system and sense organs: Z86.69

## 2021-10-13 HISTORY — DX: Anxiety disorder, unspecified: F41.9

## 2021-10-13 HISTORY — DX: Obstructive sleep apnea (adult) (pediatric): G47.33

## 2021-10-13 SURGERY — ARTHROSCOPY, KNEE, WITH MEDIAL MENISCECTOMY
Anesthesia: Choice | Site: Knee | Laterality: Left

## 2021-12-31 IMAGING — US US THYROID
1 series · 16 of 25 positions shown · non-contrast
Comparison: 08/02/2019;

CLINICAL DATA: Prior ultrasound follow-up. Thyromegaly on physical
examination. History of ultrasound-guided fine-needle aspiration of
left thyroid nodule performed 12/03/2015

EXAM:
THYROID ULTRASOUND
TECHNIQUE: Ultrasound examination of the thyroid gland and adjacent soft
tissues was performed.

[Series 1: us thyroid · 16 of 50 slices shown]
[im 1/50]
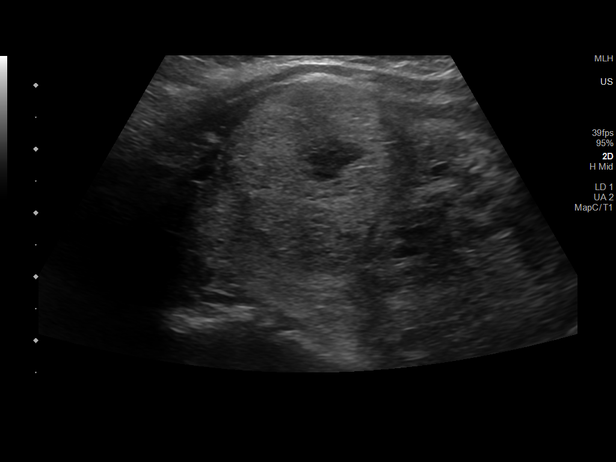
[im 5/50]
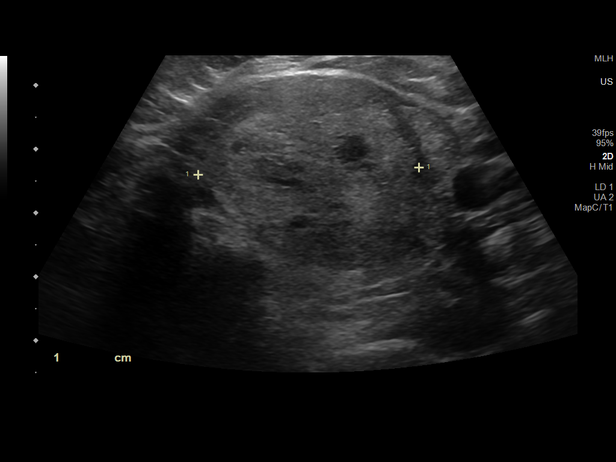
[im 7/50]
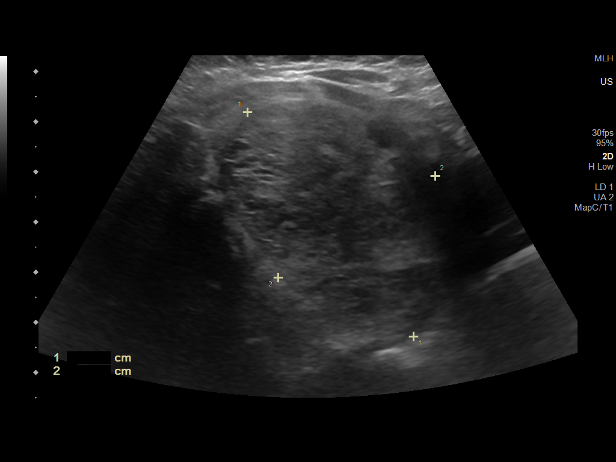
[im 11/50]
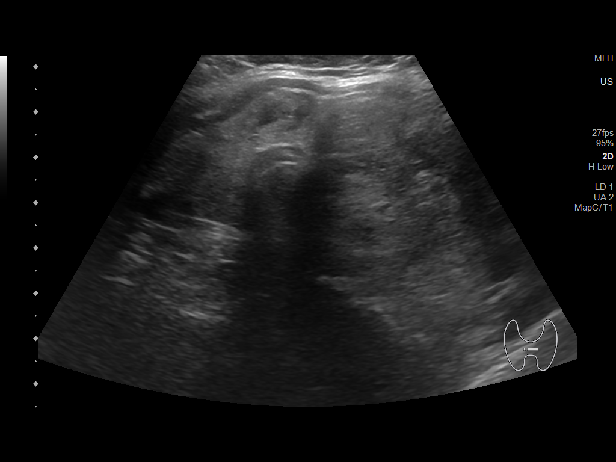
[im 15/50]
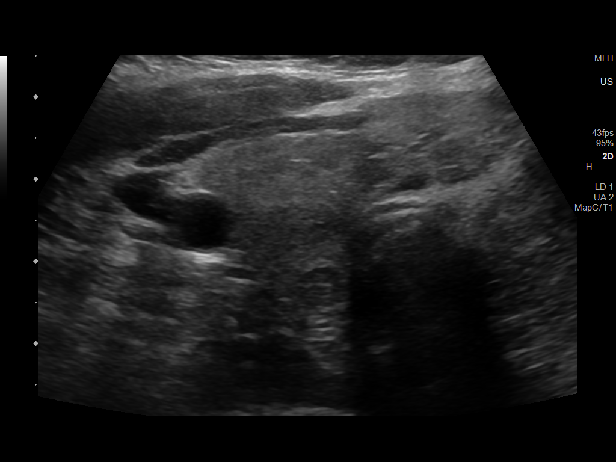
[im 17/50]
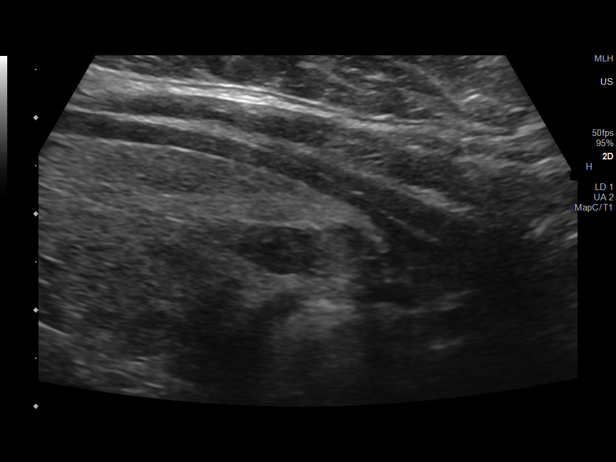
[im 21/50]
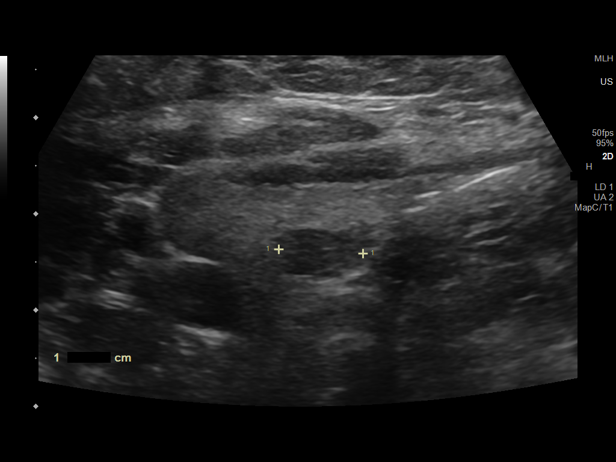
[im 23/50]
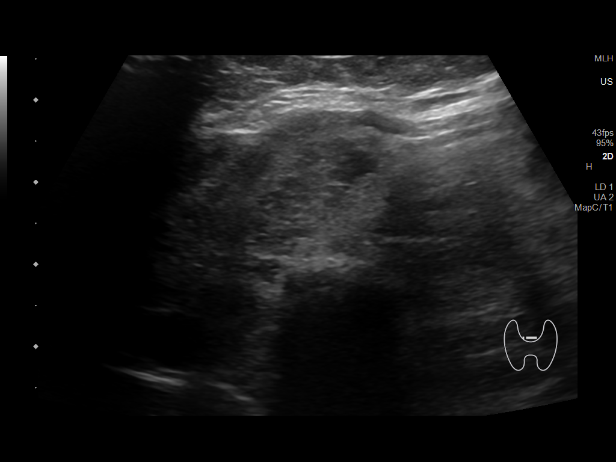
[im 27/50]
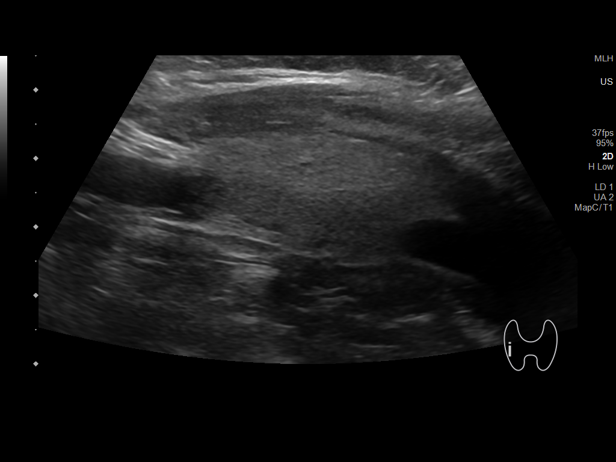
[im 29/50]
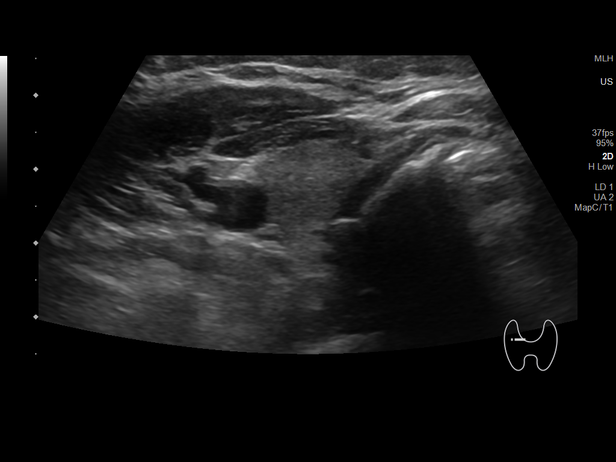
[im 33/50]
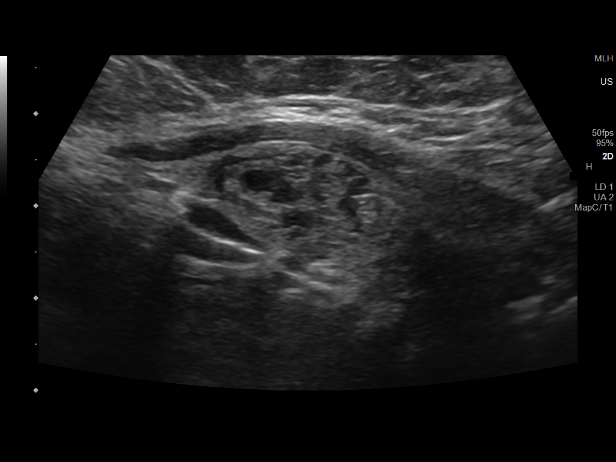
[im 35/50]
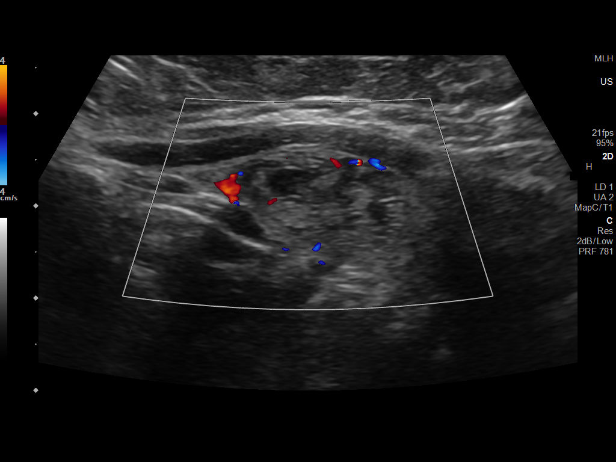
[im 39/50]
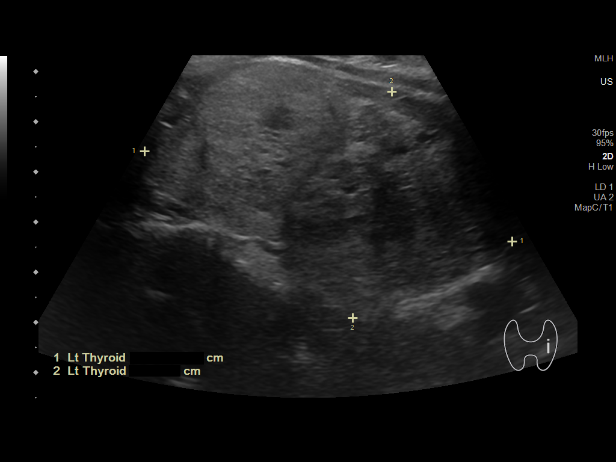
[im 43/50]
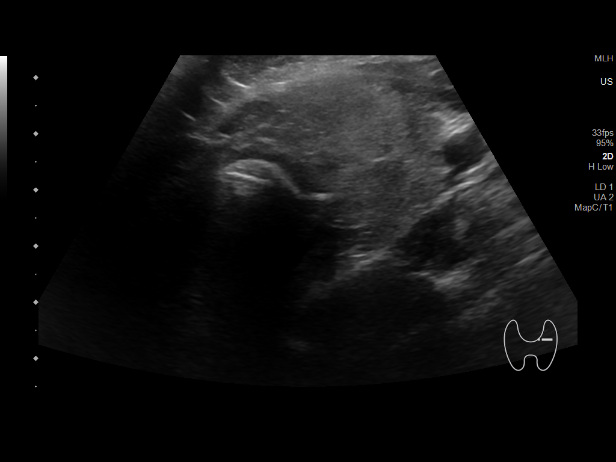
[im 45/50]
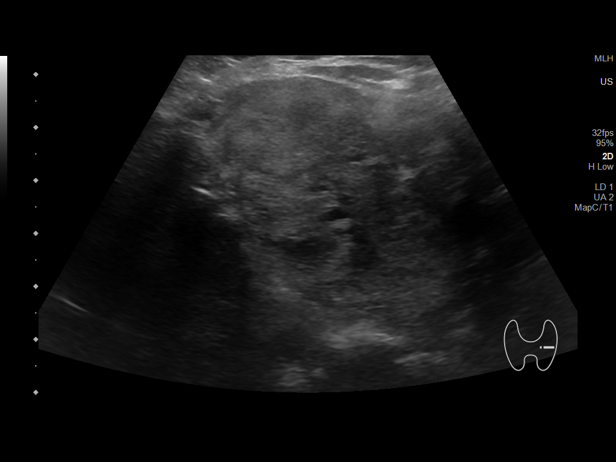
[im 50/50]
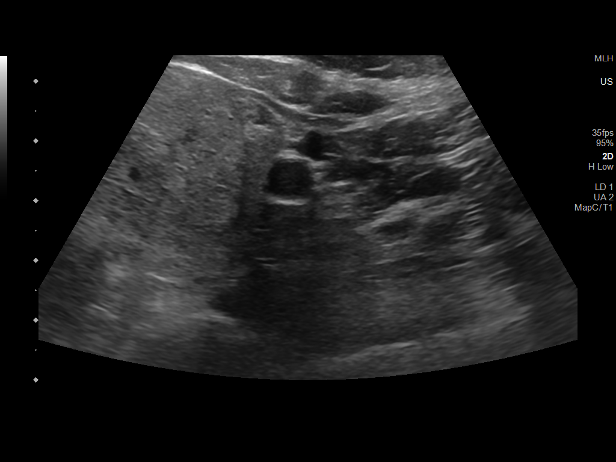

[16 of 25 positions shown; findings below may reference images not displayed]

10/03/2015; 04/01/2014 ultrasound-guided
left thyroid nodule fine-needle aspiration-12/03/2015
FINDINGS: Examination is degraded due to patient body habitus and poor
sonographic window. Examination is further degraded secondary to
moderate heterogeneity thyroid parenchyma.

Parenchymal Echotexture: Moderately heterogenous

Isthmus: Enlarged measuring 1.7 cm in diameter, previously, 1.2 cm

Right lobe: Borderline enlarged measuring 4.9 x 2.1 x 2.1 cm,
previously, 4.6 x 2.0 x 1.6 cm

Left lobe: Enlarged measuring 7.6 x 4.6 x 4.8 cm, previously, 6.5 x
3.3 x 3.6 cm

_________________________________________________________

Estimated total number of nodules >/= 1 cm: 5

Number of spongiform nodules >/=  2 cm not described below (TR1): 0

Number of mixed cystic and solid nodules >/= 1.5 cm not described
below (TR2): 0

_________________________________________________________

The approximately 2.0 cm hypoechoic ill-defined nodule/pseudonodule
within the inferior pole of the right lobe of the thyroid (labeled
1) is grossly unchanged compared to the 8410 examination. Imaging
stability for greater than 5 years is indicative of a benign
etiology.

Punctate (approximately 0.8 cm) hypoechoic nodule within the
inferior pole the right lobe of the thyroid (labeled 2), is
unchanged compared to the 8410 examination and again does not meet
criteria to recommend percutaneous sampling or continued dedicated
follow-up.

Punctate (approximately 0.7 cm) hypoechoic nodule within the
inferior pole of the right lobe of the thyroid which was not seen on
previous examinations though does not meet criteria to recommend
percutaneous sampling or continued dedicated follow-up.

_________________________________________________________

The approximately 2.0 x 1.8 x 1.2 cm spongiform/benign-appearing
nodule within the thyroid isthmus (labeled 3), has slightly
increased in size compared to the [DATE] examination, previously,
1.5 cm, though does not meet criteria to recommend percutaneous
sampling or continued dedicated follow-up.

_________________________________________________________

Nodule # 4:

Prior biopsy: No

Location: Left; Superior

Maximum size: 3.2 cm; Other 2 dimensions: 3.0 x 2.5 cm, previously,
3.2 x 2.8 x 2.7 cm

Composition: solid/almost completely solid (2)

Echogenicity: isoechoic (1)

Shape: not taller-than-wide (0)

Margins: ill-defined (0)

Echogenic foci: none (0)

ACR TI-RADS total points: 3.

ACR TI-RADS risk category:  TR3 (3 points).

Significant change in size (>/= 20% in two dimensions and minimal
increase of 2 mm): No - size differences likely attributable to scan
plane projection accentuated due to the ill-defined borders of the
nodule

Change in features: No

Change in ACR TI-RADS risk category: No

ACR TI-RADS recommendations:

**Given size (>/= 2.5 cm) and appearance, fine needle aspiration of
this mildly suspicious nodule should be considered based on TI-RADS
criteria.

_________________________________________________________

Continued increase in size of the previously biopsied now
approximately 5.6 x 3.7 x 5.1 cm isoechoic ill-defined
nodule/pseudonodule within the inferior pole the left lobe of the
thyroid (labeled 5), previously, 4.0 x 2.7 x 4.1 cm when compared to
the [DATE] examination, previously, 4.8 x 2.2 x 2.3 cm when
compared to the [DATE] examination. Correlation with previous
biopsy results is advised.
IMPRESSION: 1. Similar findings of thyromegaly and multinodular goiter. No
worrisome new thyroid nodules.
2. Nodule #4 is unchanged compared to the [DATE] examination though
again meets imaging criteria to recommend percutaneous sampling as
clinically indicated. This examination documents nearly 1 year of
stability. If ultrasound-guided fine-needle aspiration is not
stability and thus a benign etiology.
3. Previously biopsied nodule #5 has continued to increase in size,
when compared to the [DATE] and the [DATE] examinations.
Correlation with previous biopsy results is advised. Assuming a
benign pathologic diagnosis, in spite of interval growth, repeat
sampling and/or continued dedicated follow-up is not be recommended.
Otherwise, this nodule could undergo repeat ultrasound-guided
fine-needle aspiration as indicated.
4. None of the remaining thyroid nodules meet imaging criteria to
recommend percutaneous sampling or continued dedicated follow-up.

The above is in keeping with the ACR TI-RADS recommendations - [HOSPITAL] 9234;[DATE].

## 2022-02-14 ENCOUNTER — Ambulatory Visit (HOSPITAL_COMMUNITY)
Admission: EM | Admit: 2022-02-14 | Discharge: 2022-02-14 | Disposition: A | Discharge status: Home / Self Care | Payer: Medicaid Other | Attending: Internal Medicine | Admitting: Internal Medicine

## 2022-02-14 ENCOUNTER — Encounter (HOSPITAL_COMMUNITY): Payer: Self-pay

## 2022-02-14 DIAGNOSIS — Z8709 Personal history of other diseases of the respiratory system: Secondary | ICD-10-CM | POA: Diagnosis not present

## 2022-02-14 DIAGNOSIS — R0602 Shortness of breath: Secondary | ICD-10-CM | POA: Diagnosis not present

## 2022-02-14 DIAGNOSIS — U071 COVID-19: Secondary | ICD-10-CM | POA: Diagnosis not present

## 2022-02-14 DIAGNOSIS — J069 Acute upper respiratory infection, unspecified: Secondary | ICD-10-CM | POA: Diagnosis not present

## 2022-02-14 MED ORDER — ONDANSETRON 4 MG PO TBDP: 4.0000 mg | ORAL_TABLET | Freq: Three times a day (TID) | ORAL | 0 refills | Status: DC | PRN

## 2022-02-14 MED ORDER — PROMETHAZINE-DM 6.25-15 MG/5ML PO SYRP: 5.0000 mL | ORAL_SOLUTION | Freq: Every evening | ORAL | 0 refills | Status: DC | PRN

## 2022-02-14 MED ORDER — ONDANSETRON 4 MG PO TBDP
4.0000 mg | ORAL_TABLET | Freq: Once | ORAL | Status: AC
  Administered 2022-02-14: 4 mg via ORAL

## 2022-02-14 MED ORDER — VENTOLIN HFA 108 (90 BASE) MCG/ACT IN AERS: 1.0000 | INHALATION_SPRAY | Freq: Four times a day (QID) | RESPIRATORY_TRACT | 0 refills | Status: AC | PRN

## 2022-02-14 MED ORDER — ALBUTEROL SULFATE HFA 108 (90 BASE) MCG/ACT IN AERS
INHALATION_SPRAY | RESPIRATORY_TRACT | Status: AC
  Filled 2022-02-14: qty 6.7

## 2022-02-14 MED ORDER — BENZONATATE 100 MG PO CAPS: 100.0000 mg | ORAL_CAPSULE | Freq: Three times a day (TID) | ORAL | 0 refills | Status: DC

## 2022-02-14 MED ORDER — OSELTAMIVIR PHOSPHATE 75 MG PO CAPS
75.0000 mg | ORAL_CAPSULE | Freq: Two times a day (BID) | ORAL | 0 refills | Status: DC
Start: 2022-02-14 — End: 2022-06-25

## 2022-02-14 MED ORDER — ALBUTEROL SULFATE HFA 108 (90 BASE) MCG/ACT IN AERS
2.0000 | INHALATION_SPRAY | Freq: Once | RESPIRATORY_TRACT | Status: AC
  Administered 2022-02-14: 2 via RESPIRATORY_TRACT

## 2022-02-14 MED ORDER — ONDANSETRON 4 MG PO TBDP
ORAL_TABLET | ORAL | Status: AC
  Filled 2022-02-14: qty 1

## 2022-02-14 NOTE — Discharge Instructions (Addendum)
Your symptoms are most likely due to a viral illness, which will improve on its own with rest and fluids. COVID-19 testing is pending.   We will treat your symptoms with the following: - Zofran 4mg  every 8 hours as needed to help with nausea and vomiting.  Eat a bland diet for the next 12-24 hours (bananas, rice, white toast, and applesauce) once you are able to tolerate liquids (broth, etc). These foods are easy for your stomach to digest. Pedialyte can be purchased to help with rehydration. Drink plenty of water.  - Ibuprofen 600mg  and/or tylenol 1,000mg  every 6 hours as needed for fever/chills and aches/pains. - Guaifenesin (Mucinex) may be purchased over the counter to be taken as directed to reduce nasal congestion/cough - Tessalon perles every 8 hours as needed for coughing  - Two teaspoons of honey (10 ml) can be given by mouth or in warm water every four hours as needed and may decrease the amount of coughing. - Use a cool mist humidifier in your room at night  If you develop any new or worsening symptoms or do not improve in the next 2 to 3 days, please return.  If your symptoms are severe, please go to the emergency room.  Follow-up with your primary care provider for further evaluation and management of your symptoms as well as ongoing wellness visits.  I hope you feel better!

## 2022-02-14 NOTE — ED Triage Notes (Signed)
Chief Complaint: fever, sore throat, vomiting, body aches, cough productive and SOB. Patient has asthma.   Onset: 2 days   Prescriptions or OTC medications tried: Yes- tylenol cold and flu,     with no relief  Sick exposure: No  New foods, medications, or products: No  Recent Travel: No

## 2022-02-15 LAB — SARS CORONAVIRUS 2 (TAT 6-24 HRS): SARS Coronavirus 2: POSITIVE — AB

## 2022-02-16 NOTE — ED Provider Notes (Signed)
[facility]    CSN: [id number] Arrival date & time: 02/14/22  1707      History   Chief Complaint Chief Complaint  Patient presents with   URI   Shortness of Breath    HPI Stephanie Davies is a 47 y.o. female.   Patient presents to urgent care for evaluation of cough, shortness of breath, fever/chills, sore throat, nasal congestion, and generalized fatigue that started 2 days ago on Friday February 12, 2022. No known sick contacts with similar symptoms, however patient does attend school and she supposes she may have caught a viral illness there. Fever was as high as 100-101 when symptoms first started and fever broke yesterday after antipyretics. Complains of frontal headache without dizziness, eye pain, vision changes, or ear pain. Reports shortness of breath with exertion and nausea but denies chest pain, heart palpitations, extremity weakness, vomiting, abdominal pain, and paresthesias. History of asthma but does not have an in date albuterol inhaler, requesting refill of this today. No orthopnea or leg swelling. She is not a smoker and denies drug use. Has been using OTC meds without relief. No recent steroid/antibiotic use.    URI Shortness of Breath   Past Medical History:  Diagnosis Date   Acute meniscal tear of left knee    Alopecia    Anxiety    Arthritis    Chronic headaches    Depression    Eczema    GERD (gastroesophageal reflux disease)    Hemorrhoids    History of anal fissures    History of benign brain tumor    perpt history pseudotumor cerebri ,  per MRI 06-17-2000 no evidence  and MRI 11-07-2019  no evidence   History of gastric ulcer    per EGD non-bleeding  10-14-2020   History of idiopathic intracranial hypertension    yrs ago;  last MRI in epic 11-07-2019   History of kidney stones    Irritable bowel syndrome with constipation    Multinodular non-toxic goiter    followed by pcp----   hx benign bx in 2017  and 2021   OSA (obstructive  sleep apnea)    followed by dr dohmeier----   sleep study in epic 10-31-2019,  mild osa   (10-06-2021  per pt stopped used cpap approx 4 months due to chronic cough)   Renal cysts, acquired, bilateral    Upper airway cough syndrome    pulmologist--- dr wert    Patient Active Problem List   Diagnosis Date Noted   Chronic pain of right knee 10/03/2020   OSA (obstructive sleep apnea) 12/01/2019   Obesity, Class III, BMI 40-49.9 (morbid obesity) ([place]) 11/29/2019   Annual physical exam 11/29/2019   IBS (irritable bowel syndrome) 07/23/2019   History of kidney stones 07/23/2019   Pseudotumor cerebri    Chronic cough 04/11/2015   PCP NOTES >>>>>>>>>>>>>>>>>>>>>>>>>>>>>>>>>> 09/20/2014   Anxiety and depression 03/07/2014   Goiter 03/07/2014   Hives 02/20/2014   GERD (gastroesophageal reflux disease) 02/14/2014   Upper airway cough syndrome 01/22/2014   Eczema, allergic 01/09/2014   Dyspnea 01/09/2014   External bleeding hemorrhoids 10/08/2013   Rh negative status during pregnancy 06/06/2013    Past Surgical History:  Procedure Laterality Date   COLONOSCOPY WITH ESOPHAGOGASTRODUODENOSCOPY (EGD)  10/21/2020   by dr Raliegh Ip. beavers   CYSTOSCOPY W/ URETERAL STENT PLACEMENT  12/2018   in [place] N/A 03/05/2020   Procedure: APPENDECTOMY LAPAROSCOPIC;  Surgeon: Felicie Morn, MD;  Location: [facility];  Service: General;  Laterality: N/A;   LAPAROSCOPIC CHOLECYSTECTOMY  01/03/2003   @[facility]  by dr    OB History     Gravida  1   Para  1   Term  1   Preterm      AB      Living  1      SAB      IAB      Ectopic      Multiple      Live Births  1            Home Medications    Prior to Admission medications   Medication Sig Start Date End Date Taking? Authorizing Provider  acetaminophen (TYLENOL) 500 MG tablet Take 500 mg by mouth every 6 (six) hours as needed.   Yes [provider]  albuterol (VENTOLIN HFA) 108 (90 Base)  MCG/ACT inhaler Inhale 1-2 puffs into the lungs every 6 (six) hours as needed for wheezing or shortness of breath. 02/14/22  Yes 04/15/22, FNP  benzonatate (TESSALON) 100 MG capsule Take 1 capsule (100 mg total) by mouth every 8 (eight) hours. 02/14/22  Yes 04/15/22, FNP  ondansetron (ZOFRAN-ODT) 4 MG disintegrating tablet Take 1 tablet (4 mg total) by mouth every 8 (eight) hours as needed for nausea or vomiting. 02/14/22  Yes 04/15/22, FNP  oseltamivir (TAMIFLU) 75 MG capsule Take 1 capsule (75 mg total) by mouth every 12 (twelve) hours. 02/14/22  Yes 04/15/22, FNP  promethazine-dextromethorphan (PROMETHAZINE-DM) 6.25-15 MG/5ML syrup Take 5 mLs by mouth at bedtime as needed for cough. 02/14/22  Yes 04/15/22, FNP  sertraline (ZOLOFT) 100 MG tablet Take 1.5 tablets (150 mg total) by mouth daily. Patient taking differently: Take 150 mg by mouth at bedtime. 06/09/21  Yes Paz, 06/11/21, MD  budesonide-formoterol [facility]) 80-4.5 MCG/ACT inhaler Take 2 puffs first thing in am and then another 2 puffs about 12 hours later. Patient taking differently: Inhale 2 puffs into the lungs 2 (two) times daily. Take 2 puffs first thing in am and then another 2 puffs about 12 hours later. 06/17/21   08/17/21, MD  dicyclomine (BENTYL) 20 MG tablet Take 1 tablet (20 mg total) by mouth every 8 (eight) hours as needed for spasms (abdominal pain). Patient not taking: Reported on 10/06/2021 05/25/21   Petrucelli, 05/27/21, PA-C  hyoscyamine (LEVSIN SL) 0.125 MG SL tablet Place 1 tablet (0.125 mg total) under the tongue every 4 (four) hours as needed. Patient not taking: Reported on 10/06/2021 04/24/21   04/26/21, MD    Family History Family History  Problem Relation Age of Onset   Diabetes Mother    Hypertension Mother    Esophageal cancer Maternal Grandmother    Esophageal cancer Maternal Grandfather    Diabetes Maternal Grandfather    Colon cancer  Neg Hx    Colon polyps Neg Hx    Gallbladder disease Neg Hx    Kidney disease Neg Hx     Social History Social History   Tobacco Use   Smoking status: Never   Smokeless tobacco: Never  Vaping Use   Vaping Use: Never used  Substance Use Topics   Alcohol use: No   Drug use: Never     Allergies   Patient has no known allergies.   Review of Systems Review of Systems  Respiratory:  Positive for shortness of breath.   Per HPI  Physical Exam Triage Vital Signs ED Triage Vitals  Enc Vitals Group     BP 02/14/22 1815 135/85     Pulse Rate 02/14/22 1815 (!) 101     Resp 02/14/22 1815 (!) 22     Temp 02/14/22 1815 98.6 F (37 C)     Temp Source 02/14/22 1815 Oral     SpO2 02/14/22 1815 94 %     Weight 02/14/22 1814 285 lb (129.3 kg)     Height 02/14/22 1814 5\' 6"  (1.676 m)     Head Circumference --      Peak Flow --      Pain Score 02/14/22 1813 10     Pain Loc --      Pain Edu? --      Excl. in [place]? --    No data found.  Updated Vital Signs BP 135/85 (BP Location: Left Arm)   Pulse (!) 101   Temp 98.6 F (37 C) (Oral)   Resp (!) 22   Ht 5\' 6"  (1.676 m)   Wt 285 lb (129.3 kg)   LMP 01/31/2022 (Approximate)   SpO2 94%   BMI 46.00 kg/m   Visual Acuity Right Eye Distance:   Left Eye Distance:   Bilateral Distance:    Right Eye Near:   Left Eye Near:    Bilateral Near:     Physical Exam Vitals and nursing note reviewed.  Constitutional:      Appearance: She is obese. She is ill-appearing. She is not toxic-appearing.  HENT:     Head: Normocephalic and atraumatic.     Right Ear: Hearing, tympanic membrane, ear canal and external ear normal.     Left Ear: Hearing, tympanic membrane, ear canal and external ear normal.     Nose: Congestion and rhinorrhea present.     Mouth/Throat:     Lips: Pink.     Mouth: Mucous membranes are moist. No injury or oral lesions.     Dentition: Normal dentition.     Tongue: No lesions.     Palate: No mass and lesions.      Pharynx: Oropharynx is clear. Uvula midline. Posterior oropharyngeal erythema present. No pharyngeal swelling, oropharyngeal exudate or uvula swelling.     Tonsils: No tonsillar exudate or tonsillar abscesses.     Comments: Small amount of clear postnasal drainage visualized to the posterior oropharynx.  Eyes:     General: Lids are normal. Vision grossly intact. Gaze aligned appropriately.     Extraocular Movements: Extraocular movements intact.     Conjunctiva/sclera: Conjunctivae normal.     Pupils: Pupils are equal, round, and reactive to light.  Cardiovascular:     Rate and Rhythm: Normal rate and regular rhythm.     Heart sounds: Normal heart sounds, S1 normal and S2 normal.  Pulmonary:     Effort: Pulmonary effort is normal. No accessory muscle usage, prolonged expiration or respiratory distress.     Breath sounds: Normal breath sounds and air entry. No decreased breath sounds, wheezing, rhonchi or rales.     Comments: No adventitious sounds heard to auscultation of all lung fields. Speaking in full sentences without difficulty. Phonation normal. Musculoskeletal:     Cervical back: Neck supple.  Lymphadenopathy:     Cervical: Cervical adenopathy present.  Skin:    General: Skin is warm and dry.     Capillary Refill: Capillary refill takes less than 2 seconds.     Findings: No rash.  Neurological:  General: No focal deficit present.     Mental Status: She is alert and oriented to person, place, and time. Mental status is at baseline.     Cranial Nerves: No dysarthria or facial asymmetry.  Psychiatric:        Mood and Affect: Mood normal.        Speech: Speech normal.        Behavior: Behavior normal.        Thought Content: Thought content normal.        Judgment: Judgment normal.      UC Treatments / Results  Labs (all labs ordered are listed, but only abnormal results are displayed)   EKG   Radiology No results found.  Procedures Procedures (including  critical care time)  Medications Ordered in UC Medications  ondansetron (ZOFRAN-ODT) disintegrating tablet 4 mg (4 mg Oral Given 02/14/22 1835)  albuterol (VENTOLIN HFA) 108 (90 Base) MCG/ACT inhaler 2 puff (2 puffs Inhalation Given 02/14/22 1835)    Initial Impression / Assessment and Plan / UC Course  I have reviewed the triage vital signs and the nursing notes.  Pertinent labs & imaging results that were available during my care of the patient were reviewed by me and considered in my medical decision making (see chart for details).   1. Viral URI with cough Symptoms and physical exam consistent with a viral upper respiratory tract infection that will likely resolve with rest, fluids, and prescriptions for symptomatic relief. Deferred imaging based on stable cardiopulmonary exam and hemodynamically stable vital signs. She is nontoxic in appearance and is oxygenating well without supplemental support.  Due to symptoms and fever, will treat with Tamiflu and call patient if COVID-19 testing is positive to have her stop tamiflu and start paxlovid if indicated.  COVID-19 testing is pending.  We will call patient if this is positive.  Quarantine guidelines discussed. Currently on day 3 of symptoms and does qualify for antiviral therapy. She may have paxlovid.  Patient given zofran 4mg  ODT in clinic today for nausea.   Zofran, tessalon perles, promethazine DM, and albuterol inhaler sent to pharmacy for symptomatic relief to be taken as prescribed.  May continue taking over the counter medications as directed for further symptomatic relief.  Drowsiness precautions discussed regarding promethazine DM prescription.  Nonpharmacologic interventions for symptom relief provided and after visit summary below. Advised to push fluids to stay well hydrated while recovering from viral illness.   Discussed physical exam and available lab work findings in clinic with patient.  Counseled patient regarding appropriate  use of medications and potential side effects for all medications recommended or prescribed today. Discussed red flag signs and symptoms of worsening condition,when to call the PCP office, return to urgent care, and when to seek higher level of care in the emergency department. Patient verbalizes understanding and agreement with plan. All questions answered. Patient discharged in stable condition.    Final Clinical Impressions(s) / UC Diagnoses   Final diagnoses:  Viral URI with cough  History of asthma  Shortness of breath     Discharge Instructions      Your symptoms are most likely due to a viral illness, which will improve on its own with rest and fluids. COVID-19 testing is pending.   We will treat your symptoms with the following: - Zofran 4mg  every 8 hours as needed to help with nausea and vomiting.  Eat a bland diet for the next 12-24 hours (bananas, rice, white toast, and applesauce) once you  are able to tolerate liquids (broth, etc). These foods are easy for your stomach to digest. Pedialyte can be purchased to help with rehydration. Drink plenty of water.  - Ibuprofen 600mg  and/or tylenol 1,000mg  every 6 hours as needed for fever/chills and aches/pains. - Guaifenesin (Mucinex) may be purchased over the counter to be taken as directed to reduce nasal congestion/cough - Tessalon perles every 8 hours as needed for coughing  - Two teaspoons of honey (10 ml) can be given by mouth or in warm water every four hours as needed and may decrease the amount of coughing. - Use a cool mist humidifier in your room at night  If you develop any new or worsening symptoms or do not improve in the next 2 to 3 days, please return.  If your symptoms are severe, please go to the emergency room.  Follow-up with your primary care provider for further evaluation and management of your symptoms as well as ongoing wellness visits.  I hope you feel better!     ED Prescriptions     Medication Sig  Dispense Auth. Provider   benzonatate (TESSALON) 100 MG capsule Take 1 capsule (100 mg total) by mouth every 8 (eight) hours. 21 capsule , FNP   promethazine-dextromethorphan (PROMETHAZINE-DM) 6.25-15 MG/5ML syrup Take 5 mLs by mouth at bedtime as needed for cough. 118 mL 03-[birth year]-1990 M, FNP   oseltamivir (TAMIFLU) 75 MG capsule Take 1 capsule (75 mg total) by mouth every 12 (twelve) hours. 10 capsule M, FNP   albuterol (VENTOLIN HFA) 108 (90 Base) MCG/ACT inhaler Inhale 1-2 puffs into the lungs every 6 (six) hours as needed for wheezing or shortness of breath. 54 g Carlisle Beers M, FNP   ondansetron (ZOFRAN-ODT) 4 MG disintegrating tablet Take 1 tablet (4 mg total) by mouth every 8 (eight) hours as needed for nausea or vomiting. 20 tablet M, FNP      PDMP not reviewed this encounter.   Carlisle Beers, Carlisle Beers 02/16/22 2040

## 2022-02-18 ENCOUNTER — Encounter: Payer: Medicaid Other | Admitting: Nurse Practitioner

## 2022-02-18 NOTE — Progress Notes (Signed)
No show for video visit

## 2022-04-04 ENCOUNTER — Other Ambulatory Visit: Payer: Self-pay | Admitting: Internal Medicine

## 2022-06-06 ENCOUNTER — Other Ambulatory Visit: Payer: Self-pay

## 2022-06-06 ENCOUNTER — Emergency Department (HOSPITAL_COMMUNITY): Payer: Medicaid Other

## 2022-06-06 ENCOUNTER — Emergency Department (HOSPITAL_COMMUNITY)
Admission: EM | Admit: 2022-06-06 | Discharge: 2022-06-07 | Disposition: A | Discharge status: Home / Self Care | Payer: Medicaid Other | Attending: Emergency Medicine | Admitting: Emergency Medicine

## 2022-06-06 DIAGNOSIS — R112 Nausea with vomiting, unspecified: Secondary | ICD-10-CM | POA: Diagnosis present

## 2022-06-06 DIAGNOSIS — N200 Calculus of kidney: Secondary | ICD-10-CM | POA: Diagnosis not present

## 2022-06-06 DIAGNOSIS — R42 Dizziness and giddiness: Secondary | ICD-10-CM | POA: Insufficient documentation

## 2022-06-06 DIAGNOSIS — M791 Myalgia, unspecified site: Secondary | ICD-10-CM | POA: Diagnosis not present

## 2022-06-06 DIAGNOSIS — R1084 Generalized abdominal pain: Secondary | ICD-10-CM | POA: Insufficient documentation

## 2022-06-06 DIAGNOSIS — N281 Cyst of kidney, acquired: Secondary | ICD-10-CM | POA: Diagnosis not present

## 2022-06-06 DIAGNOSIS — R197 Diarrhea, unspecified: Secondary | ICD-10-CM | POA: Insufficient documentation

## 2022-06-06 DIAGNOSIS — E876 Hypokalemia: Secondary | ICD-10-CM | POA: Insufficient documentation

## 2022-06-06 LAB — COMPREHENSIVE METABOLIC PANEL
ALT: 18 U/L (ref 0–44)
AST: 16 U/L (ref 15–41)
Albumin: 3.9 g/dL (ref 3.5–5.0)
Alkaline Phosphatase: 99 U/L (ref 38–126)
Anion gap: 11 (ref 5–15)
BUN: 10 mg/dL (ref 6–20)
CO2: 18 mmol/L — ABNORMAL LOW (ref 22–32)
Calcium: 9 mg/dL (ref 8.9–10.3)
Chloride: 108 mmol/L (ref 98–111)
Creatinine, Ser: 0.89 mg/dL (ref 0.44–1.00)
GFR, Estimated: >60 mL/min (ref >60)
Glucose, Bld: 99 mg/dL (ref 70–99)
Potassium: 3.1 mmol/L — ABNORMAL LOW (ref 3.5–5.1)
Sodium: 137 mmol/L (ref 135–145)
Total Bilirubin: 0.7 mg/dL (ref 0.3–1.2)
Total Protein: 8.4 g/dL — ABNORMAL HIGH (ref 6.5–8.1)

## 2022-06-06 LAB — URINALYSIS, ROUTINE W REFLEX MICROSCOPIC
Bilirubin Urine: NEGATIVE
Glucose, UA: NEGATIVE mg/dL
Hgb urine dipstick: NEGATIVE
Ketones, ur: NEGATIVE mg/dL
Nitrite: NEGATIVE
Protein, ur: 30 mg/dL — AB
Specific Gravity, Urine: 1.019 (ref 1.005–1.030)
pH: 5 (ref 5.0–8.0)

## 2022-06-06 LAB — CBC
HCT: 42.3 % (ref 36.0–46.0)
Hemoglobin: 13.6 g/dL (ref 12.0–15.0)
MCH: 24.8 pg — ABNORMAL LOW (ref 26.0–34.0)
MCHC: 32.2 g/dL (ref 30.0–36.0)
MCV: 77.2 fL — ABNORMAL LOW (ref 80.0–100.0)
Platelets: 481 10*3/uL — ABNORMAL HIGH (ref 150–400)
RBC: 5.48 MIL/uL — ABNORMAL HIGH (ref 3.87–5.11)
RDW: 17 % — ABNORMAL HIGH (ref 11.5–15.5)
WBC: 12.1 10*3/uL — ABNORMAL HIGH (ref 4.0–10.5)
nRBC: 0 % (ref 0.0–0.2)

## 2022-06-06 LAB — LIPASE, BLOOD: Lipase: 24 U/L (ref 11–51)

## 2022-06-06 LAB — I-STAT BETA HCG BLOOD, ED (MC, WL, AP ONLY): I-stat hCG, quantitative: <5 m[IU]/mL (ref <5)

## 2022-06-06 LAB — CK: Total CK: 80 U/L (ref 38–234)

## 2022-06-06 MED ORDER — IOHEXOL 300 MG/ML  SOLN
100.0000 mL | Freq: Once | INTRAMUSCULAR | Status: AC | PRN
  Administered 2022-06-06: 100 mL via INTRAVENOUS

## 2022-06-06 MED ORDER — PANTOPRAZOLE SODIUM 40 MG IV SOLR
40.0000 mg | Freq: Once | INTRAVENOUS | Status: AC
  Administered 2022-06-06: 40 mg via INTRAVENOUS
  Filled 2022-06-06: qty 10

## 2022-06-06 MED ORDER — METOCLOPRAMIDE HCL 5 MG/ML IJ SOLN
10.0000 mg | Freq: Once | INTRAMUSCULAR | Status: AC
  Administered 2022-06-06: 10 mg via INTRAVENOUS
  Filled 2022-06-06: qty 2

## 2022-06-06 MED ORDER — LACTATED RINGERS IV BOLUS
1000.0000 mL | Freq: Once | INTRAVENOUS | Status: AC
  Administered 2022-06-06: 1000 mL via INTRAVENOUS

## 2022-06-06 MED ORDER — POTASSIUM CHLORIDE CRYS ER 20 MEQ PO TBCR
40.0000 meq | EXTENDED_RELEASE_TABLET | Freq: Once | ORAL | Status: AC
  Administered 2022-06-06: 40 meq via ORAL
  Filled 2022-06-06: qty 2

## 2022-06-06 MED ORDER — LACTATED RINGERS IV BOLUS
1000.0000 mL | Freq: Once | INTRAVENOUS | Status: AC
  Administered 2022-06-07: 1000 mL via INTRAVENOUS

## 2022-06-06 MED ORDER — DICYCLOMINE HCL 10 MG PO CAPS: 10.0000 mg | ORAL_CAPSULE | Freq: Once | ORAL | Status: DC

## 2022-06-06 MED ORDER — LOPERAMIDE HCL 2 MG PO CAPS
2.0000 mg | ORAL_CAPSULE | Freq: Once | ORAL | Status: AC
  Administered 2022-06-06: 2 mg via ORAL
  Filled 2022-06-06: qty 1

## 2022-06-06 NOTE — ED Notes (Signed)
Pt to bathroom, pt states she had diarrhea

## 2022-06-06 NOTE — ED Triage Notes (Signed)
Pt arrives c/o n/v/d, abdominal pain, generalized body aches, dizziness, decreased PO intake x4 days. Also c/o feeling weak. Took first dose of monjaro on Thursday and has been feeling ill since. Has been taking zofran without relief.

## 2022-06-06 NOTE — ED Provider Notes (Signed)
[place] EMERGENCY DEPARTMENT AT [facility] Provider Note   CSN: [id number] Arrival date & time: 06/06/22  2000     History Chief Complaint  Patient presents with   Diarrhea   Generalized Body Aches   Dizziness   Emesis    Stephanie Davies is a 47 y.o. female reportedly otherwise healthy presents to the ER for evaluation of nausea, vomiting, diarrhea for the past 3 days.  Patient reports that she started to feel lightheaded as well as some bodyaches.  Denies any racing sensation.  She reports that the symptoms started a few hours after having her first Monjuro injection.  She reports that she has not been able to tolerate p.o. even with Zofran given to her by her primary provider.  She denies any fevers.  She reports that her abdominal pain started afterwards and is mainly in the upper area and is cramping.  She denies any melena, hematochezia, coffee-ground emesis, or hematemesis.  Denies any dysuria or hematuria.  Surgical history includes cholecystectomy and appendectomy. Medications include sertraline.  No known drug allergies.  Denies any tobacco, EtOH illicit drug use.   Diarrhea Associated symptoms: abdominal pain and vomiting   Associated symptoms: no chills and no fever   Dizziness Associated symptoms: diarrhea, nausea and vomiting   Associated symptoms: no blood in stool, no chest pain and no shortness of breath   Emesis Associated symptoms: abdominal pain and diarrhea   Associated symptoms: no chills and no fever        Home Medications Prior to Admission medications   Medication Sig Start Date End Date Taking? Authorizing Provider  acetaminophen (TYLENOL) 500 MG tablet Take 500 mg by mouth every 6 (six) hours as needed.    [provider]  albuterol (VENTOLIN HFA) 108 (90 Base) MCG/ACT inhaler Inhale 1-2 puffs into the lungs every 6 (six) hours as needed for wheezing or shortness of breath. 02/14/22   Carlisle Beers, FNP  benzonatate  (TESSALON) 100 MG capsule Take 1 capsule (100 mg total) by mouth every 8 (eight) hours. 02/14/22   Carlisle Beers, FNP  budesonide-formoterol (SYMBICORT) 80-4.5 MCG/ACT inhaler Take 2 puffs first thing in am and then another 2 puffs about 12 hours later. Patient taking differently: Inhale 2 puffs into the lungs 2 (two) times daily. Take 2 puffs first thing in am and then another 2 puffs about 12 hours later. 06/17/21   Nyoka Cowden, MD  dicyclomine (BENTYL) 20 MG tablet Take 1 tablet (20 mg total) by mouth every 8 (eight) hours as needed for spasms (abdominal pain). Patient not taking: Reported on 10/06/2021 05/25/21   Petrucelli, Pleas Koch, PA-C  hyoscyamine (LEVSIN SL) 0.125 MG SL tablet Place 1 tablet (0.125 mg total) under the tongue every 4 (four) hours as needed. Patient not taking: Reported on 10/06/2021 04/24/21   Tressia Danas, MD  ondansetron (ZOFRAN-ODT) 4 MG disintegrating tablet Take 1 tablet (4 mg total) by mouth every 8 (eight) hours as needed for nausea or vomiting. 02/14/22   Carlisle Beers, FNP  oseltamivir (TAMIFLU) 75 MG capsule Take 1 capsule (75 mg total) by mouth every 12 (twelve) hours. 02/14/22   Carlisle Beers, FNP  promethazine-dextromethorphan (PROMETHAZINE-DM) 6.25-15 MG/5ML syrup Take 5 mLs by mouth at bedtime as needed for cough. 02/14/22   Carlisle Beers, FNP  sertraline (ZOLOFT) 100 MG tablet Take 1.5 tablets (150 mg total) by mouth daily. 04/05/22   Wanda Plump, MD  Allergies    Patient has no known allergies.    Review of Systems   Review of Systems  Constitutional:  Negative for chills and fever.  Respiratory:  Negative for shortness of breath.   Cardiovascular:  Negative for chest pain.  Gastrointestinal:  Positive for abdominal pain, diarrhea, nausea and vomiting. Negative for blood in stool.  Genitourinary:  Negative for dysuria, hematuria, vaginal bleeding and vaginal discharge.  Neurological:  Positive for light-headedness.  Negative for dizziness and syncope.    Physical Exam Updated Vital Signs BP 122/64   Pulse (!) 102   Temp 98.5 F (36.9 C) (Oral)   Resp 18   Ht 5\' 6"  (1.676 m)   Wt 129.3 kg   LMP 05/31/2022   SpO2 98%   BMI 46.00 kg/m  Physical Exam Vitals and nursing note reviewed.  Constitutional:      Appearance: Normal appearance. She is not toxic-appearing.     Comments: Uncomfortable, but nontoxic-appearing  HENT:     Mouth/Throat:     Mouth: Mucous membranes are dry.  Eyes:     General: No scleral icterus. Cardiovascular:     Rate and Rhythm: Regular rhythm. Tachycardia present.  Pulmonary:     Effort: Pulmonary effort is normal. No respiratory distress.     Breath sounds: Normal breath sounds.  Abdominal:     General: Bowel sounds are normal.     Palpations: Abdomen is soft.     Tenderness: There is abdominal tenderness. There is no guarding or rebound.     Comments: Some tenderness mainly to the upper abdomen.  No guarding or rebound.  Soft.  Normal active bowel sounds.  Musculoskeletal:        General: No deformity.     Cervical back: Normal range of motion.  Skin:    General: Skin is warm and dry.  Neurological:     General: No focal deficit present.     Mental Status: She is alert. Mental status is at baseline.     Cranial Nerves: No cranial nerve deficit.     Motor: No weakness.     Gait: Gait normal.     ED Results / Procedures / Treatments   Labs (all labs ordered are listed, but only abnormal results are displayed) Labs Reviewed  COMPREHENSIVE METABOLIC PANEL - Abnormal; Notable for the following components:      Result Value   Potassium 3.1 (*)    CO2 18 (*)    Total Protein 8.4 (*)    All other components within normal limits  CBC - Abnormal; Notable for the following components:   WBC 12.1 (*)    RBC 5.48 (*)    MCV 77.2 (*)    MCH 24.8 (*)    RDW 17.0 (*)    Platelets 481 (*)    All other components within normal limits  URINALYSIS, ROUTINE W  REFLEX MICROSCOPIC - Abnormal; Notable for the following components:   Protein, ur 30 (*)    Leukocytes,Ua TRACE (*)    Bacteria, UA RARE (*)    All other components within normal limits  LIPASE, BLOOD  CK  I-STAT BETA HCG BLOOD, ED ([facility], [facility], [facility] ONLY)    EKG None  Radiology CT ABDOMEN PELVIS W CONTRAST  Result Date: 06/06/2022 CLINICAL DATA:  Acute abdominal pain EXAM: CT ABDOMEN AND PELVIS WITH CONTRAST TECHNIQUE: Multidetector CT imaging of the abdomen and pelvis was performed using the standard protocol following bolus administration of intravenous contrast. RADIATION DOSE  REDUCTION: This exam was performed according to the departmental dose-optimization program which includes automated exposure control, adjustment of the mA and/or kV according to patient size and/or use of iterative reconstruction technique. CONTRAST:  OMNIPAQUE IOHEXOL 300 MG/ML  SOLN COMPARISON:  CT examination dated May 25, 2021 FINDINGS: Lower chest: No acute abnormality. Hepatobiliary: Hypodense cysts in the right hepatic lobe near the dome of the diaphragm measuring up to 1.2 cm. Status post cholecystectomy. No biliary dilatation. Pancreas: Unremarkable. No pancreatic ductal dilatation or surrounding inflammatory changes. Spleen: Normal in size without focal abnormality. Adrenals/Urinary Tract: Mild left adrenal thickening. Right adrenal is normal. Multifocal right renal cortical scarring, likely sequela of prior infectious process. Multiple bilateral renal cortical cysts, the largest in the lower pole of the left kidney measuring up to 2.7 cm, likely Bosniak type 1 cyst. 2-3 mm nonobstructing calculi in the interpolar region of the left kidney. No evidence of hydronephrosis or ureteral calculus. Urinary bladder is unremarkable. Stomach/Bowel: Stomach is within normal limits. Appendix not identified, postsurgical changes in the cecal base. No evidence of bowel wall thickening, distention, or inflammatory changes.  Vascular/Lymphatic: No significant vascular findings are present. No enlarged abdominal or pelvic lymph nodes. Reproductive: Uterus and bilateral adnexa are unremarkable. Other: No abdominal wall hernia or abnormality. No abdominopelvic ascites. Musculoskeletal: No acute or significant osseous findings. IMPRESSION: 1. 2-3 mm nonobstructing calculi in the interpolar region of the left kidney. No evidence of hydronephrosis or ureteral calculus. 2. Multiple bilateral renal cortical cysts, the largest in the lower pole of the left kidney measuring up to 2.7 cm, likely Bosniak type 1 cyst. No follow-up needed. 3. Hepatic cysts. 4. No evidence of colitis or diverticulitis. 5. Bowel loops are normal in caliber. 6. Uterus and adnexa are unremarkable. Electronically Signed   By: Larose Hires D.O.   On: 06/06/2022 22:11    Procedures Procedures   Medications Ordered in ED Medications  lactated ringers bolus 1,000 mL (has no administration in time range)  potassium chloride SA (KLOR-CON M) CR tablet 40 mEq (has no administration in time range)  metoCLOPramide (REGLAN) injection 10 mg (10 mg Intravenous Given 06/06/22 2135)  lactated ringers bolus 1,000 mL (1,000 mLs Intravenous New Bag/Given 06/06/22 2135)  iohexol (OMNIPAQUE) 300 MG/ML solution 100 mL (100 mLs Intravenous Contrast Given 06/06/22 2201)  pantoprazole (PROTONIX) injection 40 mg (40 mg Intravenous Given 06/06/22 2238)  loperamide (IMODIUM) capsule 2 mg (2 mg Oral Given 06/06/22 2237)    ED Course/ Medical Decision Making/ A&P   {                           Medical Decision Making Amount and/or Complexity of Data Reviewed Labs: ordered. Radiology: ordered.  Risk Prescription drug management.   47 y.o. female presents to the ER for evaluation of N/V/D and abdominal pain after Monjaro. Differential diagnosis includes but is not limited to ACS/MI, Boerhaave's, DKA, elevated ICP, Ischemic bowel, Sepsis, Drug-related (toxicity, THC hyperemesis,  ETOH, withdrawal), Appendicitis, Bowel obstruction, Electrolyte abnormalities, Pancreatitis, Biliary colic, Gastroenteritis, Gastroparesis, Hepatitis, Migraine, Thyroid disease, Renal colic, GERD/PUD, UTI. Vital signs show mild tachycardia, otherwise unremarkable. Physical exam as noted above.   CT and labs ordered.  I independently reviewed and interpreted the patient's labs.  CMP shows mild decrease potassium 21.  Decreased bicarb to 18 however normal anion gap.  Total protein mildly elevated 8.4.  Otherwise, no electrolyte or LFT abnormality.  CBC does show slight leukocytosis with 12.1  with an increase in platelet count as well.  hCG is negative.  CK within the limits.  Lipase within normal limits.  Urinalysis shows 30 protein with trace amount leukocytes.  There is presenting with blood cells or bacteria however squamous epithelials present.  Likely dirty catch.  Patient does not have any urinary symptoms and abdominal pain is mainly upper.  CT imaging shows 1. 2-3 mm nonobstructing calculi in the interpolar region of the left kidney. No evidence of hydronephrosis or ureteral calculus. 2. Multiple bilateral renal cortical cysts, the largest in the lower pole of the left kidney measuring up to 2.7 cm, likely Bosniak type 1 cyst. No follow-up needed. 3. Hepatic cysts. 4. No evidence of colitis or diverticulitis. 5. Bowel loops are normal in caliber. 6. Uterus and adnexa are unremarkable.  The patient did not have relief with Zofran at home so we will order Reglan and 1 L LR IVF.  Additionally ordered her some Protonix and potassium given her labs.  She has had 5 episodes of diarrhea since being here.  Will order her some Imodium as well.  On reevaluation, patient reports that feels like the Imodium is working.  No vomiting.  Will p.o. challenge.  Patient likely experiencing the symptoms from her [facility] shot. I recommended that she not take her next ones. Discussed with her about staying well  hydrated with plenty of PO fluids, mainly water. Will send home with some Reglan for nausea/vomiting and some loperamide.  She does not have a surgical abdomen.  CT unremarkable for any new changes.  From previous chart evaluation appears that patient's lack renal cyst before.  Her vital signs are stable.  Her electrolytes were replenished.  Recommend to follow-up with her primary care doctor and discontinuing her Mounjaro.  We discussed the results of the labs/imaging. The plan is follow up with PCP, stay well hydrated, discontinue Mounjaro. We discussed strict return precautions and red flag symptoms. The patient verbalized their understanding and agrees to the plan. The patient is stable and being discharged home in good condition.  Portions of this report may have been transcribed using voice recognition software. Every effort was made to ensure accuracy; however, inadvertent computerized transcription errors may be present.   Final Clinical Impression(s) / ED Diagnoses Final diagnoses:  None    Rx / DC Orders ED Discharge Orders     None

## 2022-06-07 MED ORDER — PROMETHAZINE HCL 25 MG PO TABS: 25.0000 mg | ORAL_TABLET | Freq: Four times a day (QID) | ORAL | 0 refills | Status: DC | PRN

## 2022-06-07 MED ORDER — PROMETHAZINE HCL 25 MG RE SUPP: 25.0000 mg | Freq: Four times a day (QID) | RECTAL | 0 refills | Status: DC | PRN

## 2022-06-07 MED ORDER — PROMETHAZINE HCL 25 MG PO TABS
25.0000 mg | ORAL_TABLET | Freq: Once | ORAL | Status: AC
  Administered 2022-06-07: 25 mg via ORAL
  Filled 2022-06-07: qty 1

## 2022-06-07 NOTE — Discharge Instructions (Addendum)
You were seen in the ER today for evaluation of your abdominal pain with nausea and vomiting and diarrhea.  This is likely a adverse effect from your [facility].  I am glad that you are not having any more vomiting or diarrhea.  Please make sure you are drinking plenty of fluids, mainly water.  I am going to prescribe you medication called Phenergan to take as needed for nausea and vomiting.  If you are afraid you are not able to tolerate the Phenergan tablet, I did prescribe a suppository that she can use.  Please do not use both at the same time.  Additionally, you can take over-the-counter Imodium to help.  I would also recommend a bland diet.  Please make sure you follow-up with your primary care doctor on Tuesday to discuss with her your adverse reaction.  I would not take another Mounjaro injection. If you have any concerns, new or worsening symptoms, please return to the nearest ER for re-evaluation.   Contact a doctor if: Your symptoms get worse. You have new symptoms. You have a fever. You cannot drink fluids without vomiting. You feel like you may vomit for more than 2 days. You feel light-headed or dizzy. You have a headache. You have muscle cramps. You have a rash. You have pain while peeing. Get help right away if: You have pain in your chest, neck, arm, or jaw. You feel very weak or you faint. You vomit again and again. You have vomit that is bright red or looks like black coffee grounds. You have bloody or black poop (stools) or poop that looks like tar. You have a very bad headache, a stiff neck, or both. You have very bad pain, cramping, or bloating in your belly (abdomen). You have trouble breathing. You are breathing very quickly. Your heart is beating very quickly. Your skin feels cold and clammy. You feel confused. You have signs of losing too much water in your body, such as: Dark pee, very little pee, or no pee. Cracked lips. Dry mouth. Sunken  eyes. Sleepiness. Weakness. These symptoms may be an emergency. Get help right away. Call 911. Do not wait to see if the symptoms will go away. Do not drive yourself to the hospital.

## 2022-06-07 NOTE — ED Notes (Signed)
Pt ambulated from ed with steady gait . Pt verbalized understanding of discharge instructions. Pt family to drive home.

## 2022-06-25 ENCOUNTER — Ambulatory Visit: Payer: Medicaid Other | Admitting: Internal Medicine

## 2022-06-25 ENCOUNTER — Encounter: Payer: Self-pay | Admitting: Internal Medicine

## 2022-06-25 ENCOUNTER — Telehealth: Payer: Self-pay | Admitting: Internal Medicine

## 2022-06-25 VITALS — BP 122/74 | HR 70 | Temp 98.0°F | Resp 16 | Ht 65.0 in | Wt 283.2 lb

## 2022-06-25 DIAGNOSIS — G4733 Obstructive sleep apnea (adult) (pediatric): Secondary | ICD-10-CM | POA: Diagnosis not present

## 2022-06-25 DIAGNOSIS — F419 Anxiety disorder, unspecified: Secondary | ICD-10-CM

## 2022-06-25 DIAGNOSIS — F32A Depression, unspecified: Secondary | ICD-10-CM

## 2022-06-25 MED ORDER — SERTRALINE HCL 100 MG PO TABS: 150.0000 mg | ORAL_TABLET | Freq: Every day | ORAL | 0 refills | Status: DC

## 2022-06-25 MED ORDER — KETOCONAZOLE 2 % EX CREA: 1.0000 | TOPICAL_CREAM | Freq: Every day | CUTANEOUS | 0 refills | Status: DC

## 2022-06-25 MED ORDER — ZEPBOUND 2.5 MG/0.5ML ~~LOC~~ SOAJ: 2.5000 mg | SUBCUTANEOUS | 0 refills | Status: DC

## 2022-06-25 NOTE — Telephone Encounter (Signed)
Pt called back to advise that her pharmacy sent a fax requesting ozempic since patient's insurance covers that one. Since patient is going out of town on very soon, she is asking for this to be expedited. Please call to advise.

## 2022-06-25 NOTE — Telephone Encounter (Signed)
PA denied. Plan exclusion.   This is a courtesy notification to advise you that we do not cover the above-requested  medication under this member's benefit plan.  

## 2022-06-25 NOTE — Telephone Encounter (Signed)
Unfortunately Pt has medicaid- they aren't going to cover any weight related medications. However will run PA for Zepbound which was recommended by Dr. Drue Novel.

## 2022-06-25 NOTE — Telephone Encounter (Signed)
Pt called back to advise that medication is [facility]. He can either approve the request from pharmacy to switch from zepbound to [facility] or send in new prescription.

## 2022-06-25 NOTE — Patient Instructions (Addendum)
Please make an appointment needed the wellness clinic.  Please see a counselor for stress management  We are sending a medication for ZEPBOUND  vaccines I recommend:  Covid booster      GO TO THE FRONT DESK, PLEASE SCHEDULE YOUR APPOINTMENTS Come back for physical exam at your earliest convenience

## 2022-06-25 NOTE — Progress Notes (Signed)
Subjective:    Patient ID: Stephanie Davies, female    DOB: 04-Jan-1976, 47 y.o.   MRN: [id number]  DOS:  06/25/2022 Type of visit - description: Here for weight management.  Last visit here 05/2021  Here with her husband. Her main concern is treatment for morbid obesity. States she has "tried everything". Unable to lose weight.  Also, went to the ER for nausea vomiting and diarrhea on 06/06/2022. Workup included urinalysis which was okay, potassium 3.1, slightly elevated white count of 12.1, normal lipase, CT abdomen and pelvic with contrast: Left kidney calculus Bosniak kidney cyst 1, no follow-up needed, hepatic cysts.  Was treated with IV fluids and potassium supplements   Wt Readings from Last 3 Encounters:  06/25/22 283 lb 4 oz (128.5 kg)  06/06/22 285 lb (129.3 kg)  02/14/22 285 lb (129.3 kg)  2022: 279 pounds 2021: 285 pounds   Review of Systems See above   Past Medical History:  Diagnosis Date   Acute meniscal tear of left knee    Alopecia    Anxiety    Arthritis    Chronic headaches    Depression    Eczema    GERD (gastroesophageal reflux disease)    Hemorrhoids    History of anal fissures    History of benign brain tumor    perpt history pseudotumor cerebri ,  per MRI 06-17-2000 no evidence  and MRI 11-07-2019  no evidence   History of gastric ulcer    per EGD non-bleeding  10-14-2020   History of idiopathic intracranial hypertension    yrs ago;  last MRI in epic 11-07-2019   History of kidney stones    Irritable bowel syndrome with constipation    Multinodular non-toxic goiter    followed by pcp----   hx benign bx in 2017  and 2021   OSA (obstructive sleep apnea)    followed by dr dohmeier----   sleep study in epic 10-31-2019,  mild osa   (10-06-2021  per pt stopped used cpap approx 4 months due to chronic cough)   Renal cysts, acquired, bilateral    Upper airway cough syndrome    pulmologist--- dr Sherene Sires    Past Surgical History:  Procedure Laterality  Date   COLONOSCOPY WITH ESOPHAGOGASTRODUODENOSCOPY (EGD)  10/21/2020   by dr Kirtland Bouchard. [facility]   CYSTOSCOPY W/ URETERAL STENT PLACEMENT  12/2018   in [place]   LAPAROSCOPIC APPENDECTOMY N/A 03/05/2020   Procedure: APPENDECTOMY LAPAROSCOPIC;  Surgeon: Quentin Ore, MD;  Location: [facility];  Service: General;  Laterality: N/A;   LAPAROSCOPIC CHOLECYSTECTOMY  01/03/2003   @[facility]  by dr Derrell Lolling    Current Outpatient Medications  Medication Instructions   acetaminophen (TYLENOL) 500 mg, Every 6 hours PRN   albuterol (VENTOLIN HFA) 108 (90 Base) MCG/ACT inhaler 1-2 puffs, Inhalation, Every 6 hours PRN   benzonatate (TESSALON) 100 mg, Oral, Every 8 hours   budesonide-formoterol (SYMBICORT) 80-4.5 MCG/ACT inhaler Take 2 puffs first thing in am and then another 2 puffs about 12 hours later.   dicyclomine (BENTYL) 20 mg, Oral, Every 8 hours PRN   hyoscyamine (LEVSIN SL) 0.125 mg, Sublingual, Every 4 hours PRN   ketoconazole (NIZORAL) 2 % cream 1 Application, Topical, Daily   ondansetron (ZOFRAN-ODT) 4 mg, Oral, Every 8 hours PRN   oseltamivir (TAMIFLU) 75 mg, Oral, Every 12 hours   promethazine (PHENERGAN) 25 mg, Rectal, Every 6 hours PRN   promethazine (PHENERGAN) 25 mg, Oral, Every 6 hours PRN   promethazine-dextromethorphan (  PROMETHAZINE-DM) 6.25-15 MG/5ML syrup 5 mLs, Oral, At bedtime PRN   sertraline (ZOLOFT) 150 mg, Oral, Daily       Objective:   Physical Exam BP 122/74   Pulse 70   Temp 98 F (36.7 C) (Oral)   Resp 16   Ht 5\' 5"  (1.651 m)   Wt 283 lb 4 oz (128.5 kg)   LMP 05/31/2022   SpO2 97%   BMI 47.14 kg/m  General:   Well developed, NAD, BMI noted. HEENT:  Normocephalic . Face symmetric, atraumatic Skin: Not pale. Not jaundice Neurologic:  alert & oriented X3.  Speech normal, gait appropriate for age and unassisted Psych--  Cognition and judgment appear intact.  Cooperative with normal attention span and concentration.  Behavior appropriate. No anxious or depressed  appearing.      Assessment    ASSESSMENT Anxiety, depression pseudotumor cerebri, headaches Goiter : [place] 03-2014, bx  Done 11-2015 IBS Kidney stones, kidney stent R 12/2018 renal cysts found on a renal ultrasound in [place] CT 05/2022:CT abdomen and pelvic with contrast: Left kidney calculus Bosniak kidney cyst 1, no follow-up needed, hepatic cysts. Goiter: Biopsy 08/23/2019, [facility] category 2 benign OSA: Rx CPAP Headaches: See neurology note 06/03/2020 Recurrent cough (Saw  pulmonology- 2016, dx  asthma unlikely, upper airway cough syndrome GERD/sinusitis/throat clearing) Not on BCP    PLAN: Chronic morbid obesity: She has been morbidly obese for years.  Reports that she has tried multiple diets.  Requests injectables such as Zepbound. Although she is on sertraline and anxiety and depression are well-controlled, whenever she is under stress she tends to eat. Advised that I will send a prescription for the injectable but she is aware that her insurance may not cover it. Recommend stress management, see a counselor, information provided Recommend visit with the wellness clinic for a more formal advice about diet. Anxiety depression: Symptoms controlled on sertraline, however when facing the stress she tends to eat.  Consider change her medications in the future from SSRIs to BuSpar. OSA: Intolerant to CPAP, explain risk of untreated OSA.  States she will not be able to do it. RTC for CPX recommended

## 2022-06-25 NOTE — Telephone Encounter (Addendum)
PA initiated via Covermymeds; KEY: [id number]. Awaiting determination.

## 2022-06-26 NOTE — Assessment & Plan Note (Signed)
Chronic morbid obesity: She has been morbidly obese for years.  Reports that she has tried multiple diets.  Requests injectables such as Zepbound. Although she is on sertraline and anxiety and depression are well-controlled, whenever she is under stress she tends to eat. Advised that I will send a prescription for the injectable but she is aware that her insurance may not cover it. Recommend stress management, see a counselor, information provided Recommend visit with the wellness clinic for a more formal advice about diet. Anxiety depression: Symptoms controlled on sertraline, however when facing the stress she tends to eat.  Consider change her medications in the future from SSRIs to BuSpar. OSA: Intolerant to CPAP, explain risk of untreated OSA.  States she will not be able to do it. RTC for CPX recommended

## 2022-06-28 ENCOUNTER — Encounter: Payer: Self-pay | Admitting: Internal Medicine

## 2022-06-28 MED ORDER — WEGOVY 0.25 MG/0.5ML ~~LOC~~ SOAJ: 0.2500 mg | SUBCUTANEOUS | 0 refills | Status: DC

## 2022-06-29 ENCOUNTER — Telehealth: Payer: Self-pay

## 2022-06-29 NOTE — Telephone Encounter (Signed)
PA denied. Plan exclusion.   This is a courtesy notification to advise you that we do not cover the above-requested  medication under this member's benefit plan.  

## 2022-06-29 NOTE — Telephone Encounter (Signed)
PA initiated via Covermymeds; KEY: [id number]. Awaiting determination.

## 2022-08-11 ENCOUNTER — Encounter (INDEPENDENT_AMBULATORY_CARE_PROVIDER_SITE_OTHER): Payer: Self-pay

## 2022-09-06 ENCOUNTER — Encounter: Payer: Medicaid Other | Admitting: Internal Medicine

## 2022-09-06 ENCOUNTER — Telehealth: Payer: Self-pay

## 2022-09-06 NOTE — Telephone Encounter (Signed)
Yes

## 2022-09-06 NOTE — Telephone Encounter (Signed)
Dismissal sent to [facility]

## 2022-09-06 NOTE — Telephone Encounter (Signed)
Multiple no shows  09/09/2020 04/20/2021 09/06/2022  Would you like to begin dismissal process?

## 2022-09-07 ENCOUNTER — Encounter: Payer: Self-pay | Admitting: Internal Medicine

## 2022-09-07 DIAGNOSIS — M25562 Pain in left knee: Secondary | ICD-10-CM | POA: Diagnosis not present

## 2022-09-07 DIAGNOSIS — M25561 Pain in right knee: Secondary | ICD-10-CM | POA: Diagnosis not present

## 2022-09-12 ENCOUNTER — Ambulatory Visit (HOSPITAL_COMMUNITY)
Admission: EM | Admit: 2022-09-12 | Discharge: 2022-09-12 | Disposition: A | Discharge status: Home / Self Care | Payer: Medicaid Other | Attending: Emergency Medicine | Admitting: Emergency Medicine

## 2022-09-12 ENCOUNTER — Encounter (HOSPITAL_COMMUNITY): Payer: Self-pay

## 2022-09-12 DIAGNOSIS — R030 Elevated blood-pressure reading, without diagnosis of hypertension: Secondary | ICD-10-CM

## 2022-09-12 DIAGNOSIS — R519 Headache, unspecified: Secondary | ICD-10-CM | POA: Diagnosis not present

## 2022-09-12 MED ORDER — KETOROLAC TROMETHAMINE 30 MG/ML IJ SOLN
30.0000 mg | Freq: Once | INTRAMUSCULAR | Status: AC
  Administered 2022-09-12: 30 mg via INTRAMUSCULAR

## 2022-09-12 MED ORDER — KETOROLAC TROMETHAMINE 30 MG/ML IJ SOLN
INTRAMUSCULAR | Status: AC
  Filled 2022-09-12: qty 1

## 2022-09-12 NOTE — Discharge Instructions (Addendum)
Overall your physical exam was reassuring and your blood pressure improved on recheck.  We have given you a medication today in clinic to help with your headache  Continue to monitor your blood pressure at home and record these readings to bring to your primary care appointment.  You can manage her stress at home and practice deep breathing, 5 seconds and, hold for 5 seconds, and exhale for 5 seconds.  Seek immediate care if you develop a sudden severe headache, vision changes, chest pain, or any new concerning symptoms.

## 2022-09-12 NOTE — ED Triage Notes (Signed)
Patient here today with c/o high blood pressure and headache for the last 3 days. She has been under a lot of stress lately. She is not on blood pressure medication. No h/o htn. 160/93 at home.

## 2022-09-12 NOTE — ED Provider Notes (Signed)
[facility]    CSN: [id number] Arrival date & time: 09/12/22  1248      History   Chief Complaint Chief Complaint  Patient presents with   Hypertension    HPI Stephanie Davies is a 47 y.o. female.   Patient presents to clinic complaining of headache, hypertension and increased stress.  Reports her blood pressure is normally well-controlled, no history of hypertension.  Blood pressure at previous visits has been in the 120s over 70s.  She has been having a headache for the past 3 days so she did check her blood pressure at home and has been high, 160/93 at home.  She had been taking Tylenol and ibuprofen at home, has not taken anything today.  She notices that as her blood pressure drops, her headache improves.  She is on sertraline for anxiety and depression.  She does not currently have a primary care provider.  Reports increased stress recently.  She has been traveling out of country, she is working, going to school full-time, and her child just recently started school again as well.  She denies fever, cough, chest pain, shortness of breath, congestion, or recent sick contacts.  Denies vision changes.   The history is provided by the patient and medical records.  Hypertension Associated symptoms include headaches. Pertinent negatives include no chest pain, no abdominal pain and no shortness of breath.    Past Medical History:  Diagnosis Date   Acute meniscal tear of left knee    Alopecia    Anxiety    Arthritis    Chronic headaches    Depression    Eczema    GERD (gastroesophageal reflux disease)    Hemorrhoids    History of anal fissures    History of benign brain tumor    perpt history pseudotumor cerebri ,  per MRI 06-17-2000 no evidence  and MRI 11-07-2019  no evidence   History of gastric ulcer    per EGD non-bleeding  10-14-2020   History of idiopathic intracranial hypertension    yrs ago;  last MRI in epic 11-07-2019   History of kidney stones     Irritable bowel syndrome with constipation    Multinodular non-toxic goiter    followed by pcp----   hx benign bx in 2017  and 2021   OSA (obstructive sleep apnea)    followed by dr dohmeier----   sleep study in epic 10-31-2019,  mild osa   (10-06-2021  per pt stopped used cpap approx 4 months due to chronic cough)   Renal cysts, acquired, bilateral    Upper airway cough syndrome    pulmologist--- dr wert    Patient Active Problem List   Diagnosis Date Noted   Chronic pain of right knee 10/03/2020   OSA (obstructive sleep apnea) 12/01/2019   Obesity, Class III, BMI 40-49.9 (morbid obesity) (HCC) 11/29/2019   Annual physical exam 11/29/2019   IBS (irritable bowel syndrome) 07/23/2019   History of kidney stones 07/23/2019   Pseudotumor cerebri    Chronic cough 04/11/2015   PCP NOTES >>>>>>>>>>>>>>>>>>>>>>>>>>>>>>>>>> 09/20/2014   Anxiety and depression 03/07/2014   Goiter 03/07/2014   Hives 02/20/2014   GERD (gastroesophageal reflux disease) 02/14/2014   Upper airway cough syndrome 01/22/2014   Eczema, allergic 01/09/2014   Dyspnea 01/09/2014   External bleeding hemorrhoids 10/08/2013   Rh negative status during pregnancy 06/06/2013    Past Surgical History:  Procedure Laterality Date   COLONOSCOPY WITH ESOPHAGOGASTRODUODENOSCOPY (EGD)  10/21/2020  by dr Kirtland Bouchard. [facility]   CYSTOSCOPY W/ URETERAL STENT PLACEMENT  12/2018   in [place]   LAPAROSCOPIC APPENDECTOMY N/A 03/05/2020   Procedure: APPENDECTOMY LAPAROSCOPIC;  Surgeon: Quentin Ore, MD;  Location: [facility];  Service: General;  Laterality: N/A;   LAPAROSCOPIC CHOLECYSTECTOMY  01/03/2003   @[facility]  by dr Derrell Lolling    OB History     Gravida  1   Para  1   Term  1   Preterm      AB      Living  1      SAB      IAB      Ectopic      Multiple      Live Births  1            Home Medications    Prior to Admission medications   Medication Sig Start Date End Date Taking? Authorizing Provider   acetaminophen (TYLENOL) 500 MG tablet Take 500 mg by mouth every 6 (six) hours as needed.   Yes [provider]  sertraline (ZOLOFT) 100 MG tablet Take 1.5 tablets (150 mg total) by mouth daily. 06/25/22  Yes Paz, Nolon Rod, MD  albuterol (VENTOLIN HFA) 108 (90 Base) MCG/ACT inhaler Inhale 1-2 puffs into the lungs every 6 (six) hours as needed for wheezing or shortness of breath. Patient not taking: Reported on 06/25/2022 02/14/22   Carlisle Beers, FNP  budesonide-formoterol [facility]) 80-4.5 MCG/ACT inhaler Take 2 puffs first thing in am and then another 2 puffs about 12 hours later. Patient not taking: Reported on 06/25/2022 06/17/21   Nyoka Cowden, MD  ketoconazole (NIZORAL) 2 % cream Apply 1 Application topically daily. 06/25/22   Wanda Plump, MD  Semaglutide-Weight Management [facility]) 0.25 MG/0.5ML SOAJ Inject 0.25 mg into the skin once a week. 06/28/22   Wanda Plump, MD    Family History Family History  Problem Relation Age of Onset   Diabetes Mother    Hypertension Mother    Esophageal cancer Maternal Grandmother    Esophageal cancer Maternal Grandfather    Diabetes Maternal Grandfather    Colon cancer Neg Hx    Colon polyps Neg Hx    Gallbladder disease Neg Hx    Kidney disease Neg Hx     Social History Social History   Tobacco Use   Smoking status: Never   Smokeless tobacco: Never  Vaping Use   Vaping status: Never Used  Substance Use Topics   Alcohol use: No   Drug use: Never     Allergies   Patient has no known allergies.   Review of Systems Review of Systems  Constitutional:  Negative for fever.  HENT:  Negative for sore throat.   Respiratory:  Negative for cough and shortness of breath.   Cardiovascular:  Negative for chest pain.  Gastrointestinal:  Negative for abdominal pain.  Neurological:  Positive for headaches.     Physical Exam Triage Vital Signs ED Triage Vitals  Encounter Vitals Group     BP 09/12/22 1430 (!) 146/89      Systolic BP Percentile --      Diastolic BP Percentile --      Pulse Rate 09/12/22 1430 74     Resp 09/12/22 1430 16     Temp 09/12/22 1430 98.5 F (36.9 C)     Temp Source 09/12/22 1430 Oral     SpO2 09/12/22 1430 98 %     Weight 09/12/22 1430 280 lb (  127 kg)     Height 09/12/22 1430 5\' 6"  (1.676 m)     Head Circumference --      Peak Flow --      Pain Score 09/12/22 1429 9     Pain Loc --      Pain Education --      Exclude from Growth Chart --    No data found.  Updated Vital Signs BP (!) 146/89 (BP Location: Left Arm)   Pulse 74   Temp 98.5 F (36.9 C) (Oral)   Resp 16   Ht 5\' 6"  (1.676 m)   Wt 280 lb (127 kg)   LMP 09/10/2022 (Exact Date)   SpO2 98%   BMI 45.19 kg/m   Visual Acuity Right Eye Distance:   Left Eye Distance:   Bilateral Distance:    Right Eye Near:   Left Eye Near:    Bilateral Near:     Physical Exam Vitals and nursing note reviewed.  Constitutional:      Appearance: Normal appearance.  HENT:     Head: Normocephalic and atraumatic.     Right Ear: External ear normal.     Left Ear: External ear normal.     Nose: Nose normal.  Eyes:     General: No scleral icterus.    Extraocular Movements: Extraocular movements intact.     Conjunctiva/sclera: Conjunctivae normal.     Pupils: Pupils are equal, round, and reactive to light.  Cardiovascular:     Rate and Rhythm: Normal rate and regular rhythm.     Heart sounds: Normal heart sounds. No murmur heard. Pulmonary:     Effort: Pulmonary effort is normal. No respiratory distress.     Breath sounds: Normal breath sounds.  Musculoskeletal:        General: Normal range of motion.     Cervical back: Normal range of motion.  Skin:    General: Skin is warm and dry.  Neurological:     General: No focal deficit present.     Mental Status: She is alert.  Psychiatric:        Mood and Affect: Mood normal.      UC Treatments / Results  Labs (all labs ordered are listed, but only abnormal  results are displayed) Labs Reviewed - No data to display  EKG   Radiology No results found.  Procedures Procedures (including critical care time)  Medications Ordered in UC Medications  ketorolac (TORADOL) 30 MG/ML injection 30 mg (30 mg Intramuscular Given 09/12/22 1513)    Initial Impression / Assessment and Plan / UC Course  I have reviewed the triage vital signs and the nursing notes.  Pertinent labs & imaging results that were available during my care of the patient were reviewed by me and considered in my medical decision making (see chart for details).  Vitals and triage reviewed, patient is hemodynamically stable.  On blood pressure recheck, blood pressure improved to 134/82.  Patient continues to have slight headache, low concern for hypertensive urgency or emergency.  No vision changes.  Blood pressure appears slightly elevated, but not dangerously.  Lungs are vesicular, heart with regular rate and rhythm. PERRLA, EOMI.  Staff will schedule with her primary care provider for close follow-up.  IM Toradol in clinic for headache.  Stress management discussed. Strict emergency return precautions given if symptoms worsen.     Final Clinical Impressions(s) / UC Diagnoses   Final diagnoses:  Bad headache  Elevated blood pressure reading without diagnosis  of hypertension     Discharge Instructions      Overall your physical exam was reassuring and your blood pressure improved on recheck.  We have given you a medication today in clinic to help with your headache  Continue to monitor your blood pressure at home and record these readings to bring to your primary care appointment.  You can manage her stress at home and practice deep breathing, 5 seconds and, hold for 5 seconds, and exhale for 5 seconds.  Seek immediate care if you develop a sudden severe headache, vision changes, chest pain, or any new concerning symptoms.      ED Prescriptions   None    PDMP not  reviewed this encounter.   Audria Takeshita, [place], [place] 09/12/22 1515

## 2022-09-17 ENCOUNTER — Other Ambulatory Visit: Payer: Self-pay | Admitting: Internal Medicine

## 2022-10-15 IMAGING — CT CT ABD-PELV W/ CM
2 of 5 series · 16 of 46 positions shown, 18 images · IV contrast (OMNIPAQUE)
Comparison: CT abdomen pelvis 03/23/2020.

CLINICAL DATA: Left upper quadrant abdominal pain.

EXAM:
CT ABDOMEN AND PELVIS WITH CONTRAST
TECHNIQUE: Multidetector CT imaging of the abdomen and pelvis was performed
using the standard protocol following bolus administration of
intravenous contrast.

[Series 2: axial st · axial · 0.98mm/px · z∈[+929,+1369]mm · 13 of 102 slices shown, 15 images]
[im 7/102  soft-tissue]
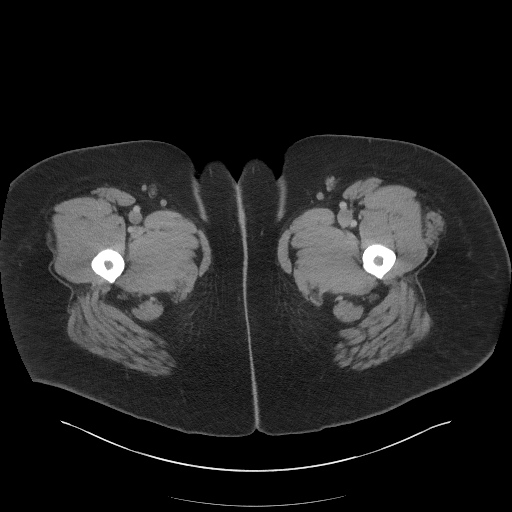
[im 7/102  bone]
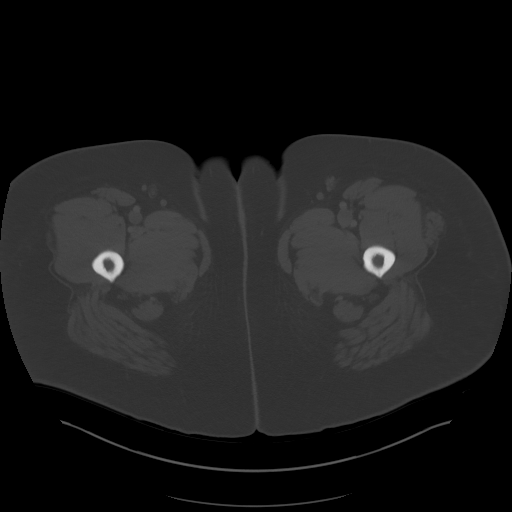
[im 14/102  soft-tissue]
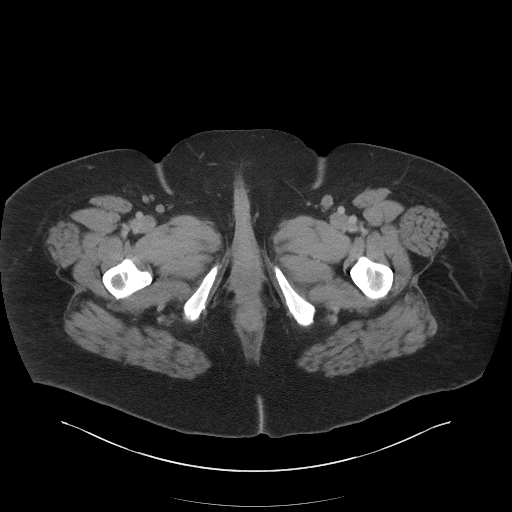
[im 21/102  soft-tissue]
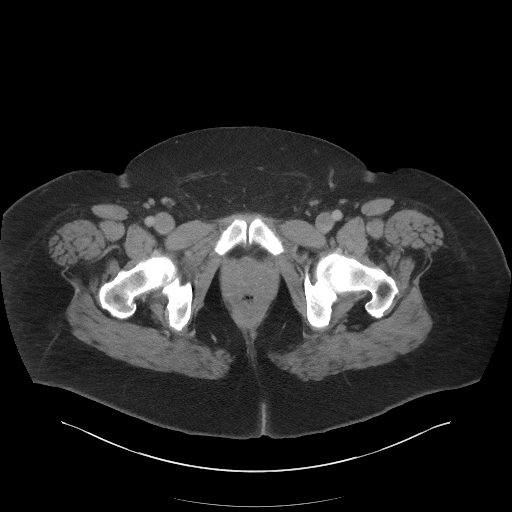
[im 27/102  soft-tissue]
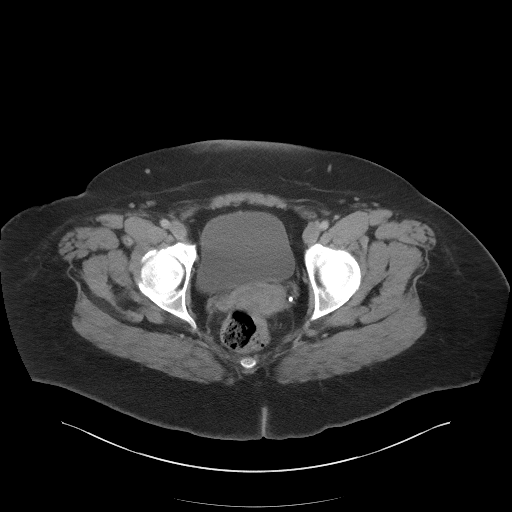
[im 34/102  soft-tissue]
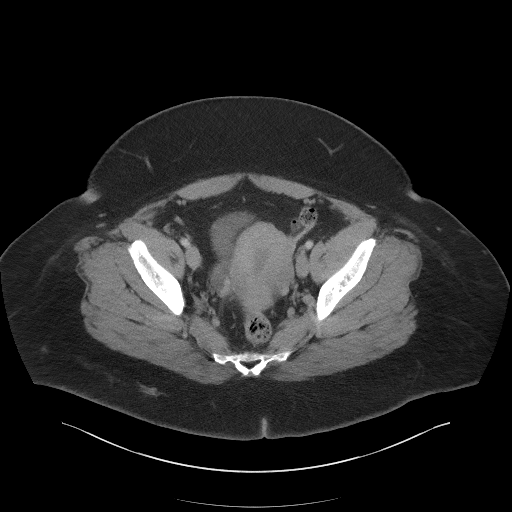
[im 41/102  soft-tissue]
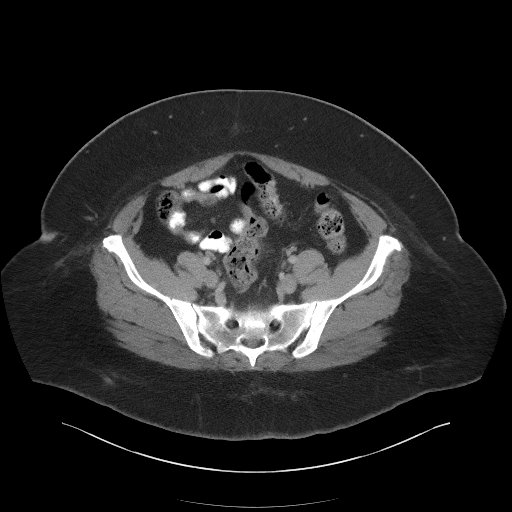
[im 54/102  soft-tissue]
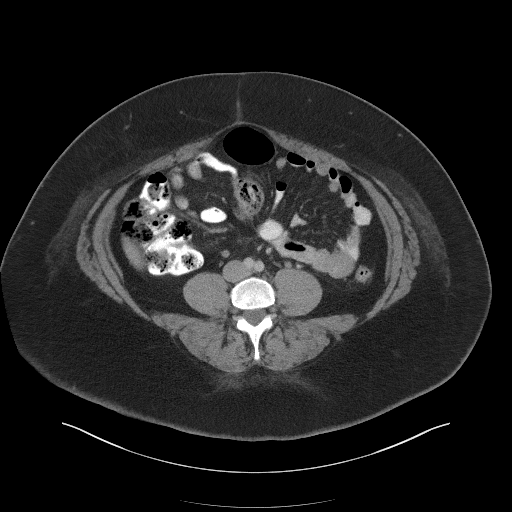
[im 61/102  soft-tissue]
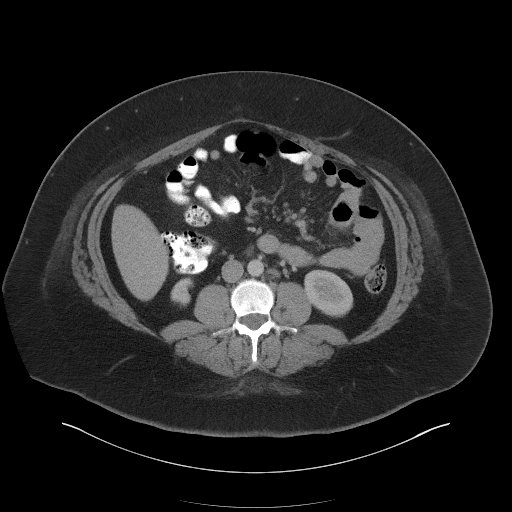
[im 68/102  soft-tissue]
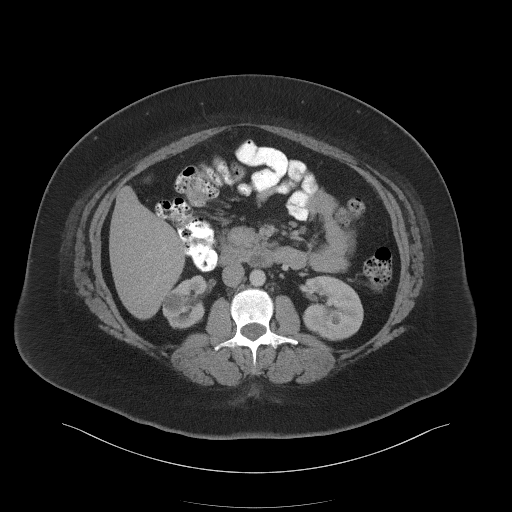
[im 68/102  bone]
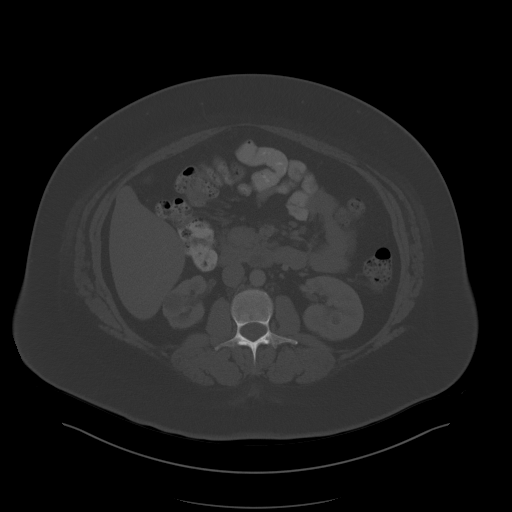
[im 75/102  soft-tissue]
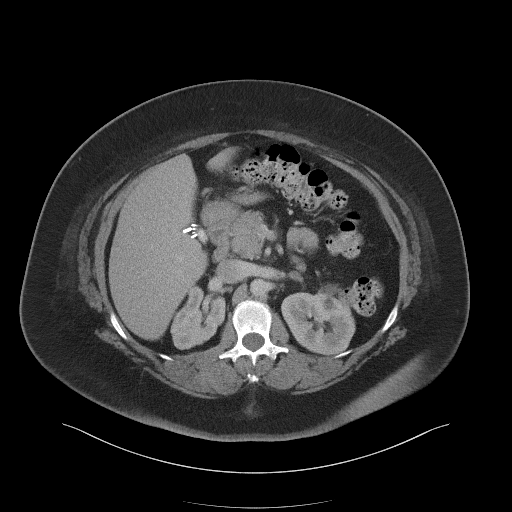
[im 81/102  soft-tissue]
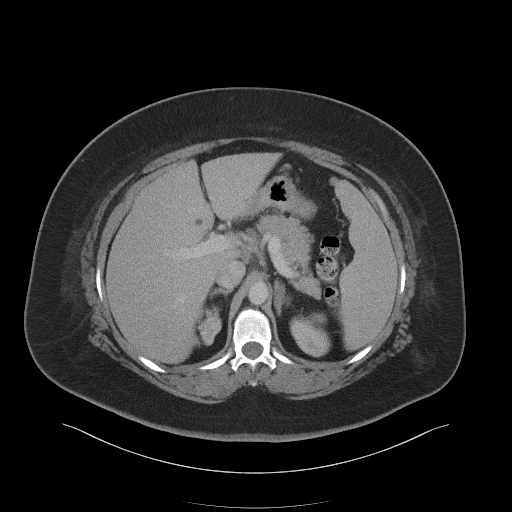
[im 88/102  soft-tissue]
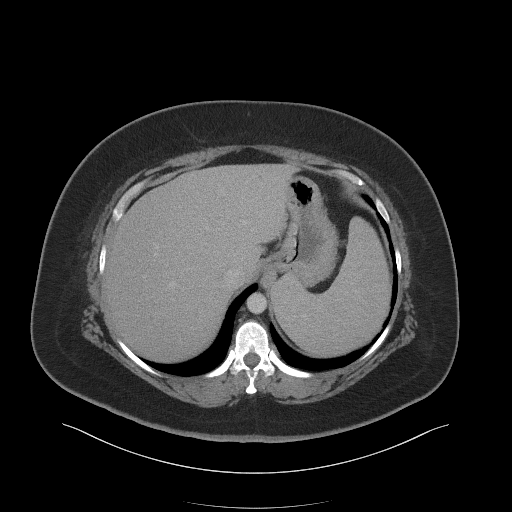
[im 95/102  soft-tissue]
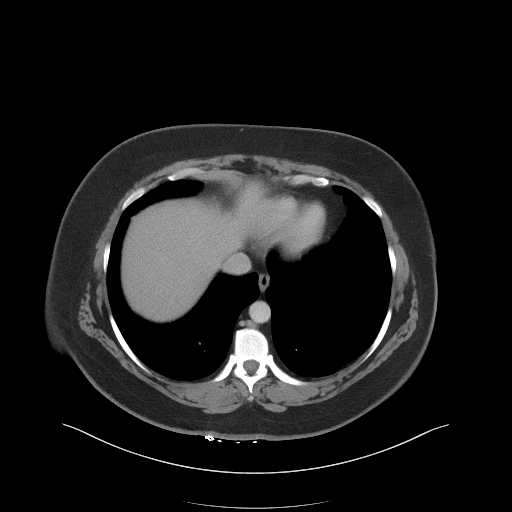

[Series 5: coronal st · coronal · 0.98mm/px · 3 of 123 slices shown]
[im 41/123  soft-tissue]
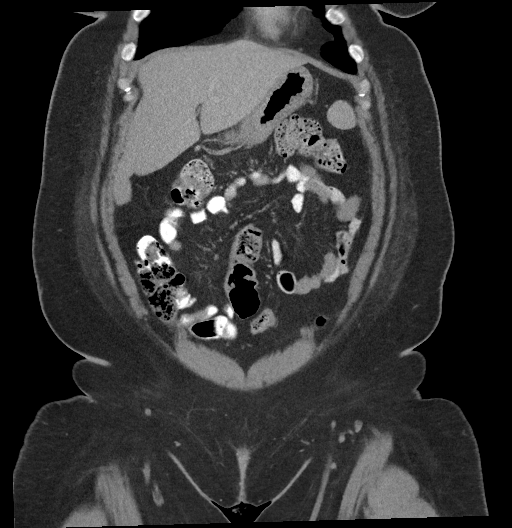
[im 55/123  soft-tissue]
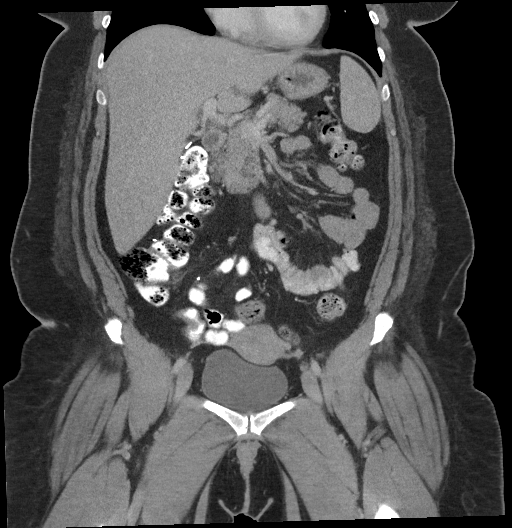
[im 68/123  soft-tissue]
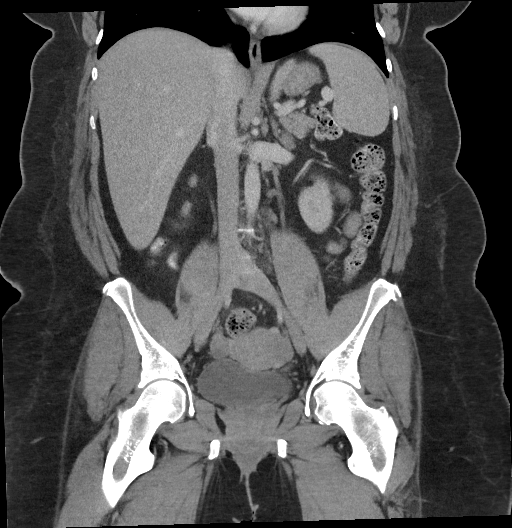

[16 of 46 positions shown; findings below may reference images not displayed]

RADIATION DOSE REDUCTION: This exam was performed according to the
departmental dose-optimization program which includes automated
exposure control, adjustment of the mA and/or kV according to
patient size and/or use of iterative reconstruction technique.

CONTRAST:  100mL OMNIPAQUE IOHEXOL 300 MG/ML  SOLN
FINDINGS: Lower chest: Normal heart size. Dependent atelectasis within the
lower lobes bilaterally. No pleural effusion.

Hepatobiliary: Liver is normal in size and contour. Stable cyst
within the right hepatic lobe. Additional subcentimeter
hypodensities are stable likely representing cysts. Prior
cholecystectomy.

Pancreas: Unremarkable

Spleen: Spleen is mildly enlarged measuring 15.6 cm.

Adrenals/Urinary Tract: Stable mild left adrenal gland thickening.
Normal right adrenal gland. The right kidney is lobular in contour.
Unchanged 1.4 cm low-attenuation lesion mid pole right kidney,
favored to represent a small cyst. Interval increased density
internally involving the low-attenuation lesion measuring 1.9 cm
within the superior pole left kidney (image 30; series 2). Punctate
calcifications within the left kidney are unchanged. Urinary bladder
is unremarkable.

Stomach/Bowel: No abnormal bowel wall thickening or evidence for
bowel obstruction. No free fluid or free intraperitoneal air. Normal
morphology of the stomach.

Vascular/Lymphatic: Normal caliber abdominal aorta. No
retroperitoneal lymphadenopathy.

Reproductive: Uterus and adnexal structures are unremarkable.

Other: None.

Musculoskeletal: No aggressive or acute appearing osseous lesions.
IMPRESSION: 1. Interval increased internal density within the lesion involving
the superior pole of the left kidney. This may potentially represent
interval hemorrhage. Recommend further evaluation with pre and post
contrast-enhanced abdominal MRI given the interval change in
appearance.
2. No acute process within the abdomen or pelvis.

## 2022-11-19 DIAGNOSIS — R1084 Generalized abdominal pain: Secondary | ICD-10-CM | POA: Diagnosis not present

## 2022-11-25 DIAGNOSIS — E049 Nontoxic goiter, unspecified: Secondary | ICD-10-CM | POA: Diagnosis not present

## 2022-11-25 DIAGNOSIS — Z1329 Encounter for screening for other suspected endocrine disorder: Secondary | ICD-10-CM | POA: Diagnosis not present

## 2022-11-25 DIAGNOSIS — Z833 Family history of diabetes mellitus: Secondary | ICD-10-CM | POA: Diagnosis not present

## 2022-12-08 DIAGNOSIS — E042 Nontoxic multinodular goiter: Secondary | ICD-10-CM | POA: Diagnosis not present

## 2023-02-18 DIAGNOSIS — R41840 Attention and concentration deficit: Secondary | ICD-10-CM | POA: Diagnosis not present

## 2023-02-21 DIAGNOSIS — H1089 Other conjunctivitis: Secondary | ICD-10-CM | POA: Diagnosis not present

## 2023-02-21 DIAGNOSIS — R41840 Attention and concentration deficit: Secondary | ICD-10-CM | POA: Diagnosis not present

## 2023-02-21 DIAGNOSIS — J339 Nasal polyp, unspecified: Secondary | ICD-10-CM | POA: Diagnosis not present

## 2023-02-21 DIAGNOSIS — J3089 Other allergic rhinitis: Secondary | ICD-10-CM | POA: Diagnosis not present

## 2023-02-23 DIAGNOSIS — E042 Nontoxic multinodular goiter: Secondary | ICD-10-CM | POA: Diagnosis not present

## 2023-03-25 DIAGNOSIS — E042 Nontoxic multinodular goiter: Secondary | ICD-10-CM | POA: Diagnosis not present

## 2023-03-25 DIAGNOSIS — J45909 Unspecified asthma, uncomplicated: Secondary | ICD-10-CM | POA: Diagnosis not present

## 2023-03-25 DIAGNOSIS — K219 Gastro-esophageal reflux disease without esophagitis: Secondary | ICD-10-CM | POA: Diagnosis not present

## 2023-06-07 DIAGNOSIS — M25562 Pain in left knee: Secondary | ICD-10-CM | POA: Diagnosis not present

## 2023-06-07 DIAGNOSIS — M25561 Pain in right knee: Secondary | ICD-10-CM | POA: Diagnosis not present

## 2023-10-19 ENCOUNTER — Ambulatory Visit: Payer: Self-pay

## 2023-10-19 NOTE — Telephone Encounter (Signed)
 FYI Only or Action Required?: FYI only for provider.  Patient is followed in Pulmonology for chronic cough, last seen on 06/17/2021 by Darlean Ozell NOVAK, MD.  Called Nurse Triage reporting Shortness of Breath.  Symptoms began several months ago.  Interventions attempted: Nothing.  Symptoms are: gradually worsening.  Triage Disposition: No disposition on file.  Patient/caregiver understands and will follow disposition?:  Call disconnected. This RN made 3rd attempt to contact patient, no asnwer, LVM with CB number.   Answer Assessment - Initial Assessment Questions 1. RESPIRATORY STATUS: Describe your breathing? (e.g., wheezing, shortness of breath, unable to speak, severe coughing)      Shortness of breath, worse at night  2. ONSET: When did this breathing problem begin?      Worsening over the past 3 weeks  3. PATTERN Does the difficult breathing come and go, or has it been constant since it started?      Breathing difficult comes and goes  4. SEVERITY: How bad is your breathing? (e.g., mild, moderate, severe)      Moderate shortness  5. RECURRENT SYMPTOM: Have you had difficulty breathing before? If Yes, ask: When was the last time? and What happened that time?      Yes, patient has hx of chronic cough  6. CARDIAC HISTORY: Do you have any history of heart disease? (e.g., heart attack, angina, bypass surgery, angioplasty)      *No Answer* 7. LUNG HISTORY: Do you have any history of lung disease?  (e.g., pulmonary embolus, asthma, emphysema)     Yes  8. CAUSE: What do you think is causing the breathing problem?      *No Answer* 9. OTHER SYMPTOMS: Do you have any other symptoms? (e.g., chest pain, cough, dizziness, fever, runny nose)     Chest tights 10. O2 SATURATION MONITOR:  Do you use an oxygen saturation monitor (pulse oximeter) at home? If Yes, ask: What is your reading (oxygen level) today? What is your usual oxygen saturation reading? (e.g.,  95%)       No  11. PREGNANCY: Is there any chance you are pregnant? When was your last menstrual period?       No; LMP 2 weeks ago  12. TRAVEL: Have you traveled out of the country in the last month? (e.g., travel history, exposures)       No  Protocols used: Breathing Difficulty-A-AH

## 2023-10-19 NOTE — Telephone Encounter (Signed)
 Call dropped prior to transfer. Specialist and nursing Attempt x2 to connect at [phone] with no answer and no VM.  Copied from CRM #[id number]. Topic: Clinical - Red Word Triage >> Oct 19, 2023  2:06 PM Rilla B wrote: [facility] that prompted transfer to Nurse Triage: Shortness of breath   ----------------------------------------------------------------------- From previous Reason for Contact - Scheduling: Patient/patient representative is calling to schedule an appointment. Refer to attachments for appointment information.

## 2023-11-23 DIAGNOSIS — E042 Nontoxic multinodular goiter: Secondary | ICD-10-CM | POA: Diagnosis not present

## 2023-12-04 DIAGNOSIS — R051 Acute cough: Secondary | ICD-10-CM | POA: Diagnosis not present

## 2023-12-04 DIAGNOSIS — J040 Acute laryngitis: Secondary | ICD-10-CM | POA: Diagnosis not present

## 2023-12-05 ENCOUNTER — Ambulatory Visit

## 2023-12-05 ENCOUNTER — Encounter: Payer: Self-pay | Admitting: Internal Medicine

## 2023-12-05 ENCOUNTER — Ambulatory Visit: Admitting: Internal Medicine

## 2023-12-05 VITALS — BP 122/80 | HR 87 | Temp 98.3°F | Ht 66.0 in | Wt 290.0 lb

## 2023-12-05 DIAGNOSIS — R0609 Other forms of dyspnea: Secondary | ICD-10-CM

## 2023-12-05 DIAGNOSIS — R053 Chronic cough: Secondary | ICD-10-CM

## 2023-12-05 DIAGNOSIS — R059 Cough, unspecified: Secondary | ICD-10-CM | POA: Diagnosis not present

## 2023-12-05 LAB — BRAIN NATRIURETIC PEPTIDE: Pro B Natriuretic peptide (BNP): 10 pg/mL (ref 0.0–100.0)

## 2023-12-05 LAB — BASIC METABOLIC PANEL WITH GFR
BUN: 7 mg/dL (ref 6–23)
CO2: 26 meq/L (ref 19–32)
Calcium: 9.3 mg/dL (ref 8.4–10.5)
Chloride: 106 meq/L (ref 96–112)
Creatinine, Ser: 0.7 mg/dL (ref 0.40–1.20)
GFR: 103.91 mL/min (ref >60.00)
Glucose, Bld: 94 mg/dL (ref 70–99)
Potassium: 3.9 meq/L (ref 3.5–5.1)
Sodium: 139 meq/L (ref 135–145)

## 2023-12-05 LAB — CBC WITH DIFFERENTIAL/PLATELET
Basophils Absolute: 0 K/uL (ref 0.0–0.1)
Basophils Relative: 0.3 % (ref 0.0–3.0)
Eosinophils Absolute: 0.3 K/uL (ref 0.0–0.7)
Eosinophils Relative: 2.5 % (ref 0.0–5.0)
HCT: 38.2 % (ref 36.0–46.0)
Hemoglobin: 12.6 g/dL (ref 12.0–15.0)
Lymphocytes Relative: 16.1 % (ref 12.0–46.0)
Lymphs Abs: 1.7 K/uL (ref 0.7–4.0)
MCHC: 33 g/dL (ref 30.0–36.0)
MCV: 76.4 fl — ABNORMAL LOW (ref 78.0–100.0)
Monocytes Absolute: 0.4 K/uL (ref 0.1–1.0)
Monocytes Relative: 4.2 % (ref 3.0–12.0)
Neutro Abs: 8.2 K/uL — ABNORMAL HIGH (ref 1.4–7.7)
Neutrophils Relative %: 76.9 % (ref 43.0–77.0)
Platelets: 377 K/uL (ref 150.0–400.0)
RBC: 5.01 Mil/uL (ref 3.87–5.11)
RDW: 16.7 % — ABNORMAL HIGH (ref 11.5–15.5)
WBC: 10.7 K/uL — ABNORMAL HIGH (ref 4.0–10.5)

## 2023-12-05 MED ORDER — METHYLPREDNISOLONE ACETATE 80 MG/ML IJ SUSP: 120.0000 mg | Freq: Once | INTRAMUSCULAR | Status: DC

## 2023-12-05 MED ORDER — BUDESONIDE-FORMOTEROL FUMARATE 80-4.5 MCG/ACT IN AERO: INHALATION_SPRAY | RESPIRATORY_TRACT | 12 refills | Status: DC

## 2023-12-05 MED ORDER — FAMOTIDINE 20 MG PO TABS
ORAL_TABLET | ORAL | 11 refills | Status: AC
Start: 2023-12-05 — End: ?

## 2023-12-05 MED ORDER — OMEPRAZOLE 40 MG PO CPDR
DELAYED_RELEASE_CAPSULE | ORAL | 2 refills | Status: AC
Start: 2023-12-05 — End: ?

## 2023-12-05 MED ORDER — TRAMADOL HCL 50 MG PO TABS
50.0000 mg | ORAL_TABLET | ORAL | 0 refills | Status: AC | PRN
Start: 2023-12-05 — End: 2023-12-10

## 2023-12-05 NOTE — Progress Notes (Unsigned)
 Subjective:    Patient ID: Stephanie Davies, female    DOB: 1975/12/21   MRN: [id number]    Brief patient profile:  48  yo [place] female smoker immigrated in 1999 with first term baby Aug 2015  With onset during the last month of IUP= chest tight, sob and feels like rock on chest no change at all p baby born referred by Dr Bishop to pulmonary clinic 01/21/2014    History of Present Illness  01/21/2014 [facility] office visit/ Sheril Hammond   Chief Complaint  Patient presents with   Pulmonary Consult    Referred by Dr. Dallas Bishop. [name]  She states feels like a rock in my chest.    ex tol not back to normal since delivery with doe x more than slow adls Cough did not start until  early dec 2015 day > noct,  ? Better immediately p  Qvar , dry   Better if stays up >  30 degrees when supine Symptoms were are insidious, pattern is daily, noct, not really progressive but certainly persistent Has saba but never took out of box  Prednisone  10 mg take  4 each am x 2 days,   2 each am x 2 days,  1 each am x 2 days and stop  Stop qvar  for now  Pantoprazole  (protonix ) 40 mg   Take 30-60 min before first meal of the day and Pepcid  20 mg one bedtime along with chlortrimeton 4 mg at bedtime until you return     02/18/2014 f/u ov/Yuto Cajuste re: unexplained cp/sob/cough post partum, no longer nursing  Chief Complaint  Patient presents with   Follow-up    pt c/o sharp chest tightness Xfew days, esophageal tightness.  still cannot lay flat.    cough came on p chest tightness and sob and is now completely gone x 2 weeks s need for cough meds  Breathing much better and rare if ever  need for saba Chest tightness gone New cp center of chest x two weeks prior to OV  Diffuse mostly midline rad to back and does not hurt to take deep breath/ no change with meals but much worse when trieds to lie flat > w/u underway by Dr Amon   Rec F/u prn     06/17/2021  Re-establish  ov/Renley Gutman re: recurrent cough  x 5 months Jan 2023 onset with uri / neg covid testing  Chief Complaint  Patient presents with   Consult    Pt states she has had a chronic cough that is worse at night but states there will be times when she will be fine and then all of a sudden she will start to cough.  Dyspnea: 10 min walking  then cough stops her, not really doe  Cough: rx abx/inhaler/pred  helped a lot but still present/ cough immediately when stirs, not while sleeping assoc with sense of pnds  Peaks around 1 pm and after supper / off ppi x  2 weeks no worse Sleeping: bed is electric about 10 degrees  SABA use: symbicort  80 helped the most  02: none Covid status:   vax full  LLower abd pain worse with  coughing no better on ppi and bid h2 no worse off it , worse hs  Pnds x 2013 esp very cold or very hot  Rec GERD diet reviewed, bed blocks rec  Symbicort  80 Take 2 puffs first  thing in am and then another 2 puffs about 12 hours later.  Work on inhaler technique:  For drainage / throat tickle try take CHLORPHENIRAMINE  4 mg    For cough > tessalon  200 mg every 6 hours as needed   Return to clinic when you are back from [place] and bring your inhaler    12/05/2023  f/u ov/[place] Skalicky re: sob while in [place] July 2025 > ER start symb 10/ventolin  > much better then returned Sep 04 2023    maint on no rx  then ariybd 11/21/23 cough started  and one day prior doxy / tessalon   Chief Complaint  Patient presents with   Medical Management of Chronic Issues   Cough    Cough x 2 wks- non prod. Her breathing has been worse at night and she has to sleep propped up.    Dyspnea:  walking 50 ft  Cough: yellow  Sleeping: > 30 degrees hob due to cough/ sob   SABA use: none 02: none  Lung cancer screening :       No obvious day to day or daytime variability or assoc excess/ purulent sputum or mucus plugs or hemoptysis or cp or chest tightness, subjective wheeze or overt sinus or  hb symptoms.    Also denies any obvious fluctuation of symptoms with weather or environmental changes or other aggravating or alleviating factors except as outlined above   No unusual exposure hx or h/o childhood pna/ asthma or knowledge of premature birth.  Current Allergies, Complete Past Medical History, Past Surgical History, Family History, and Social History were reviewed in [employer] record.  ROS  The following are not active complaints unless bolded Hoarseness, sore throat, dysphagia, dental problems, itching, sneezing,  nasal congestion or discharge of excess mucus or purulent secretions, ear ache,   fever, chills, sweats, unintended wt loss or wt gain, classically pleuritic or exertional cp,  orthopnea pnd or arm/hand swelling  or leg swelling, presyncope, palpitations, abdominal pain, anorexia, nausea, vomiting, diarrhea  or change in bowel habits or change in bladder habits, change in stools or change in urine, dysuria, hematuria,  rash, arthralgias, visual complaints, headache, numbness, weakness or ataxia or problems with walking or coordination,  change in mood or  memory.        Current Meds  Medication Sig   benzonatate  (TESSALON ) 200 MG capsule Take 200 mg by mouth 3 (three) times daily as needed.   doxycycline  (VIBRA -TABS) 100 MG tablet Take 100 mg by mouth 2 (two) times daily.               Objective:   Physical Exam   Wts  12/05/2023       290  06/17/2021          290 02/18/2014          258   01/21/14 254 lb (115.214 kg)  01/09/14 256 lb 9.6 oz (116.393 kg)  11/15/13 248 lb 12.8 oz (112.855 kg)     Vital signs reviewed  12/05/2023  - Note at rest 02 sats  99% on RA   General appearance:    amb MO (by BMI) [place] female nad    HEENT : Oropharynx  clear       NECK :  without  apparent JVD/ palpable Nodes/TM    LUNGS: no acc muscle use,  Nl contour chest which is clear to A and P bilaterally without cough on insp or exp  maneuvers  CV:  RRR  no s3 or murmur or increase in P2, and no edema   ABD:  quit obese soft and nontender   MS:   t   ext warm without deformities Or obvious joint restrictions  calf tenderness, cyanosis or clubbing    SKIN: warm and dry without lesions    NEURO:  alert, approp, nl sensorium with  no motor or cerebellar deficits apparent.        Assessment & Plan:

## 2023-12-05 NOTE — Patient Instructions (Addendum)
 Please remember to go to the lab department   for your tests - we will call you with the results when they are available.      Please remember to go to the  x-ray department  for your tests - we will call you with the results when they are available    GERD (REFLUX)  is an extremely common cause of respiratory symptoms just like yours , many times with no obvious heartburn at all.    It can be treated with medication, but also with lifestyle changes including elevation of the head of your bed (ideally with 6 -8inch blocks under the headboard of your bed),  Smoking cessation, avoidance of late meals, excessive alcohol, and avoid fatty foods, chocolate, peppermint, colas, red wine, and acidic juices such as orange juice.  NO MINT OR MENTHOL  PRODUCTS SO NO COUGH DROPS  USE SUGARLESS CANDY INSTEAD (Jolley ranchers or Stover's or Life Savers) or even ice chips will also do - the key is to swallow to prevent all throat clearing. NO OIL BASED VITAMINS - use powdered substitutes.  Avoid fish oil when coughing.   Omeprazole  40 mg   Take  30-60 min before first meal of the day and Pepcid  (famotidine )  20 mg after supper until rno cough for a week off cough medication     Symbicort  80 Take 2 puffs first thing in am and then another 2 puffs about 12 hours later ()do not stop)    Work on inhaler technique:  relax and gently blow all the way out then take a nice smooth full deep breath back in, triggering the inhaler at same time you start breathing in.  Hold for up to 5 seconds if you can. Blow out thru nose. Rinse and gargle with water when done.  If mouth or throat bother you at all,  try brushing teeth/gums/tongue with arm and hammer toothpaste/ make a slurry and gargle and spit out.    For drainage / throat tickle try take CHLORPHENIRAMINE  4 mg  (Allergy Relief 4mg   at [facility] should be easiest to find in the blue box usually on bottom shelf)  take one every 4 hours as needed - extremely  effective and inexpensive over the counter- may cause drowsiness so start with just a dose or two an hour before bedtime and see how you tolerate it before trying in daytime.   For cough > tramadol  50 mg every 4 hours as needed   Please schedule a follow up office visit in 4 weeks, sooner if needed with pfts in meantime

## 2023-12-06 LAB — IGE: IgE (Immunoglobulin E), Serum: 328 kU/L — ABNORMAL HIGH (ref <=114)

## 2023-12-06 LAB — D-DIMER, QUANTITATIVE: D-Dimer, Quant: 0.35 ug{FEU}/mL (ref <0.50)

## 2023-12-06 NOTE — Assessment & Plan Note (Addendum)
 02/18/2014  Walked RA x 3 laps @ 185 ft each stopped due to  End of study, no sob or desat, moderate pace  02/18/14 spirometry p walking completely wnl off all resp rx  - 12/05/2023 worse in setting of ? URI onset 11/21/23 rx doxy/tessalon  >>> doe labs 12/05/2023   Discussed in detail all the  indications, usual  risks and alternatives  relative to the benefits with patient who agrees to proceed with Rx as outlined.      Each maintenance medication was reviewed in detail including emphasizing most importantly the difference between maintenance and prns and under what circumstances the prns are to be triggered using an action plan format where appropriate.  Total time for H and P, chart review, counseling, reviewing hfa device(s) and generating customized AVS unique to this office visit / same day charting = 

## 2023-12-06 NOTE — Assessment & Plan Note (Addendum)
 Resolved in 2016 on gerd rx and off all inhalers - recurred Jan 2023 responded best to symbicort  80 - Allergy screen 06/17/2021 >  Eos 0.2 /  IgE 396  -  06/17/2021 trial of just symbicort  80 2bid and 1st gen H1 blockers per guidelines  > resolved - recurred in [place]  July 2025 > ER start symb 80/ventolin  improved and flared off symbicort  80 around 11/10 / 25 so rx doxy/tessalon  200 on 12/04/23  - 12/05/2023  After extensive coaching inhaler device,  effectiveness =    75% for hfa (SHORT ti) with compoent of upper airway wheeze ? From TM - Allergy screen 12/05/2023 >  Eos 0.3 /  IgE  328    >>> try symbicort  80 2bid along with cyclical cough protocol below  Of the three most common causes of  Sub-acute / recurrent or chronic cough, only one (GERD)  can actually contribute to/ trigger  the other two (asthma and post nasal drip syndrome)  and perpetuate the cylce of cough.  While not intuitively obvious, many patients with chronic low grade reflux do not cough until there is a primary insult that disturbs the protective epithelial barrier and exposes sensitive nerve endings.   This is typically viral but can due to PNDS and  either may apply here.     >>>The point is that once this occurs, it is difficult to eliminate the cycle  using anything but a maximally effective acid suppression regimen at least in the short run, accompanied by an appropriate diet to address non acid GERD and control / eliminate the cough itself for at least 3 days with tramadol  plus 1st gen H1 blockers per guidelines  to cover PNDS and low dose symciort to cover asthma.

## 2023-12-07 ENCOUNTER — Ambulatory Visit: Payer: Self-pay | Admitting: Internal Medicine

## 2023-12-07 NOTE — Progress Notes (Signed)
 ATC x1. LVMTCB

## 2023-12-12 ENCOUNTER — Telehealth: Payer: Self-pay

## 2023-12-12 NOTE — Progress Notes (Signed)
 ATCx1 LVMTCB. Sending MyChart message. NFN

## 2023-12-12 NOTE — Telephone Encounter (Unsigned)
 Copied from CRM #[id number]. Topic: Clinical - Prescription Issue >> Dec 07, 2023  4:59 PM [name] wrote: Reason for CRM: Patient has been having issues with her budesonide -formoterol  (SYMBICORT ) 80-4.5 MCG/ACT inhaler, she said there has been a problem with her getting it covered by her insurance.    Tried to reach out to patient VM/ LM to return call.

## 2023-12-13 ENCOUNTER — Telehealth: Payer: Self-pay | Admitting: *Deleted

## 2023-12-13 NOTE — Telephone Encounter (Signed)
 Dr. Darlean, We are having to get a PA for the patient's Symbicort  and she is unable to get a refill.  Please advise what she can substitute if anything as far as samples in the meantime.  Thank you.

## 2023-12-13 NOTE — Telephone Encounter (Signed)
 Copied from CRM #[id number]. Topic: Clinical - Prescription Issue >> Dec 07, 2023  4:59 PM [name] wrote: Reason for CRM: Patient has been having issues with her budesonide -formoterol  (SYMBICORT ) 80-4.5 MCG/ACT inhaler, she said there has been a problem with her getting it covered by her insurance. >> Dec 13, 2023  3:22 PM [name] wrote: Patient returning call from [facility] - please call back at earliest convenience  >> Dec 13, 2023 12:58 PM [name] wrote: Pt stated pharmacy advised her the inhaler budesonide -formoterol  (SYMBICORT ) 80-4.5 MCG/ACT  has to be pre approved and she is not able to pick it up    I called and spoke with patient, she states that the Symbicort  needs a PA.  I advised her that I would send a message to our PA team and have them start the process.  I let her know we would let her know when we get an answer.  She verbalized understanding.  Pharmacy team,  Please start a PA for SYMBICORT ) 80-4.5 MCG/ACT.  She is out of her medication and cannot get it refilled.  Thank you.  She has tried and failed Qvar  and Breo.  She says this was covered previously.

## 2023-12-13 NOTE — Telephone Encounter (Signed)
 Lmtcb re: Symbicort  inhaler denial. Patient will need to contact her insurance to see what's covered.

## 2023-12-14 ENCOUNTER — Other Ambulatory Visit (HOSPITAL_COMMUNITY): Payer: Self-pay

## 2023-12-14 ENCOUNTER — Telehealth: Payer: Self-pay

## 2023-12-14 NOTE — Telephone Encounter (Signed)
*  Pulm  Pharmacy Patient Advocate Encounter   Received notification from Fax that prior authorization for Breyna  is required/requested.   Insurance verification completed.   The patient is insured through [employer].   Per test claim:  Brand Symbicort  is preferred by the insurance.  If suggested medication is appropriate, Please send in a new RX and discontinue this one. If not, please advise as to why it's not appropriate so that we may request a Prior Authorization. Please note, some preferred medications may still require a PA.  If the suggested medications have not been trialed and there are no contraindications to their use, the PA will not be submitted, as it will not be approved.   Brand Symbicort - $4.00

## 2023-12-16 MED ORDER — BUDESONIDE-FORMOTEROL FUMARATE 80-4.5 MCG/ACT IN AERO: 2.0000 | INHALATION_SPRAY | Freq: Two times a day (BID) | RESPIRATORY_TRACT | 12 refills | Status: AC

## 2023-12-16 NOTE — Telephone Encounter (Signed)
 Brand symbicort  sent to preferred pharm.

## 2023-12-27 ENCOUNTER — Other Ambulatory Visit (HOSPITAL_BASED_OUTPATIENT_CLINIC_OR_DEPARTMENT_OTHER): Payer: Self-pay

## 2023-12-27 DIAGNOSIS — R0609 Other forms of dyspnea: Secondary | ICD-10-CM

## 2023-12-29 NOTE — Telephone Encounter (Signed)
ATC x1.  LVM to return call. 

## 2024-01-02 ENCOUNTER — Ambulatory Visit (INDEPENDENT_AMBULATORY_CARE_PROVIDER_SITE_OTHER)

## 2024-01-02 DIAGNOSIS — R0609 Other forms of dyspnea: Secondary | ICD-10-CM | POA: Diagnosis not present

## 2024-01-02 LAB — PULMONARY FUNCTION TEST
DL/VA % pred: 125 %
DL/VA: 5.37 ml/min/mmHg/L
DLCO cor % pred: 113 %
DLCO cor: 25.91 ml/min/mmHg
DLCO unc % pred: 110 %
DLCO unc: 25.25 ml/min/mmHg
FEF 25-75 Post: 3.96 L/s
FEF 25-75 Pre: 4.12 L/s
FEF2575-%Change-Post: -3 %
FEF2575-%Pred-Post: 130 %
FEF2575-%Pred-Pre: 136 %
FEV1-%Change-Post: 0 %
FEV1-%Pred-Post: 97 %
FEV1-%Pred-Pre: 97 %
FEV1-Post: 3.01 L
FEV1-Pre: 3 L
FEV1FVC-%Change-Post: 0 %
FEV1FVC-%Pred-Pre: 106 %
FEV6-%Change-Post: 0 %
FEV6-%Pred-Post: 92 %
FEV6-%Pred-Pre: 91 %
FEV6-Post: 3.48 L
FEV6-Pre: 3.46 L
FEV6FVC-%Pred-Post: 102 %
FEV6FVC-%Pred-Pre: 102 %
FVC-%Change-Post: 0 %
FVC-%Pred-Post: 90 %
FVC-%Pred-Pre: 89 %
FVC-Post: 3.48 L
FVC-Pre: 3.46 L
Post FEV1/FVC ratio: 86 %
Post FEV6/FVC ratio: 100 %
Pre FEV1/FVC ratio: 87 %
Pre FEV6/FVC Ratio: 100 %
RV % pred: 121 %
RV: 2.2 L
TLC % pred: 105 %
TLC: 5.65 L

## 2024-01-02 NOTE — Progress Notes (Signed)
 Full PFT performed today.

## 2024-01-02 NOTE — Patient Instructions (Signed)
 Full PFT performed today.

## 2024-01-03 ENCOUNTER — Encounter: Payer: Self-pay | Admitting: Internal Medicine

## 2024-01-03 ENCOUNTER — Ambulatory Visit: Admitting: Internal Medicine

## 2024-01-03 VITALS — BP 118/68 | HR 90 | Temp 98.9°F | Ht 66.0 in | Wt 292.4 lb

## 2024-01-03 DIAGNOSIS — Z6841 Body Mass Index (BMI) 40.0 and over, adult: Secondary | ICD-10-CM | POA: Diagnosis not present

## 2024-01-03 DIAGNOSIS — R053 Chronic cough: Secondary | ICD-10-CM

## 2024-01-03 DIAGNOSIS — R0609 Other forms of dyspnea: Secondary | ICD-10-CM

## 2024-01-03 MED ORDER — METHYLPREDNISOLONE ACETATE 80 MG/ML IJ SUSP
120.0000 mg | Freq: Once | INTRAMUSCULAR | Status: AC
  Administered 2024-01-03: 120 mg via INTRAMUSCULAR

## 2024-01-03 MED ORDER — TRAMADOL HCL 50 MG PO TABS: 50.0000 mg | ORAL_TABLET | ORAL | 0 refills | Status: AC | PRN

## 2024-01-03 NOTE — Progress Notes (Signed)
 "  Subjective:    Patient ID: Stephanie Davies, female    DOB: 12-17-1975   MRN: [id number]    Brief patient profile:  48  yo [place] female never smoker immigrated in 1999 with first term baby Aug 2015  With onset during the last month of IUP= chest tight, sob and feels like rock on chest no change at all p baby born referred by Dr Bishop to pulmonary clinic 01/21/2014    History of Present Illness  01/21/2014 1st [facility] office visit/ Stephanie Davies   Chief Complaint  Patient presents with   Pulmonary Consult    Referred by Dr. Dallas Bishop. [name]  She states feels like a rock in my chest.    ex tol not back to normal since delivery with doe x more than slow adls Cough did not start until  early dec 2015 day > noct,  ? Better immediately p  Qvar , dry   Better if stays up >  30 degrees when supine Symptoms were are insidious, pattern is daily, noct, not really progressive but certainly persistent Has saba but never took out of box  Prednisone  10 mg take  4 each am x 2 days,   2 each am x 2 days,  1 each am x 2 days and stop  Stop qvar  for now  Pantoprazole  (protonix ) 40 mg   Take 30-60 min before first meal of the day and Pepcid  20 mg one bedtime along with chlortrimeton 4 mg at bedtime until you return> resolved and no f/u needed        06/17/2021  Re-establish  ov/Stephanie Davies re: recurrent cough  x Jan 2023 onset with uri / neg covid testing  Chief Complaint  Patient presents with   Consult    Pt states she has had a chronic cough that is worse at night but states there will be times when she will be fine and then all of a sudden she will start to cough.  Dyspnea: 10 min walking  then cough stops her, not really doe  Cough: rx abx/inhaler/pred  helped a lot but still present/ cough immediately when stirs, not while sleeping assoc with sense of pnds  Peaks around 1 pm and after supper / off ppi x  2 weeks no  worse Sleeping: bed is electric about 10 degrees  SABA use: symbicort  80 helped the most  02: none Covid status:   vax full  LLower abd pain worse with  coughing no better on ppi and bid h2 no worse off it , worse hs  Pnds x 2013 esp very cold or very hot  Rec GERD diet reviewed, bed blocks rec  Symbicort  80 Take 2 puffs first thing in am and then another 2 puffs about 12 hours later.  Work on inhaler technique:  For drainage / throat tickle try take CHLORPHENIRAMINE  4 mg    For cough > tessalon  200 mg every 6 hours as needed   Return to clinic when you are back from [place] and bring your inhaler > did not return as cough reslved    12/05/2023  f/u ov/Stephanie Davies re: sob while in [place] July 2025 > ER start symb 80/ventolin  > much better then returned to [place]  Sep 04 2023    maint on no rx  then about  11/21/23 cough started back  and rx 12/04/23 doxy / tessalon  rx no better  Chief Complaint  Patient presents with   Medical Management of Chronic Issues   Cough    Cough x 2 wks- non prod. Her breathing has been worse at night and she has to sleep propped up.   Dyspnea:  walking 50 ft  Cough: yellow  Sleeping: > 30 degrees hob due to cough/ sob   SABA use: none 02: none  Patient Instructions  Please remember to go to the lab department   for your tests - we will call you with the results when they are available. Please remember to go to the  x-ray department  for your tests - we will call you with the results when they are available   GERD diet reviewed, bed blocks rec  Omeprazole  40 mg   Take  30-60 min before first meal of the day and Pepcid  (famotidine )  20 mg after supper until rno cough for a week off cough medication   Symbicort  80 Take 2 puffs first thing in am and then another 2 puffs about 12 hours later ( do not stop)   Work on inhaler technique:    For drainage / throat tickle try take CHLORPHENIRAMINE  4 mg For cough > tramadol  50 mg every 4 hours as needed     PFTs  01/02/24  nl except for ERV 31% % wt 291 p ? Prior to study  and no insp plateau    01/03/2024  f/u ov/Stephanie Davies re: 11/21/23   maint on symbicort  80 and gerd rx   Chief Complaint  Patient presents with   Medical Management of Chronic Issues   Cough    Increased cough since she had her PFT done 12/22/5. She is coughing up some yellow sputum and also c/o wheezing.     Dyspnea:  no change  Cough: was fine until pfs now non-stop dry cough just like prior flare    SABA use: not helping  02: none       No obvious day to day or daytime variability or assoc   mucus plugs or hemoptysis or cp or chest tightness,  or overt sinus or hb symptoms.    Also denies any obvious fluctuation of symptoms with weather or environmental changes or other aggravating or alleviating factors except as outlined above   No unusual exposure hx or h/o childhood pna/ asthma or knowledge of premature birth.  Current Allergies, Complete Past Medical History, Past Surgical History, Family History, and Social History were reviewed in [employer] record.  ROS  The following are not active complaints unless bolded Hoarseness, sore throat, dysphagia, dental problems, itching, sneezing,  nasal congestion or discharge of excess mucus or purulent secretions, ear ache,   fever, chills, sweats, unintended wt loss or wt gain, classically pleuritic or exertional cp,  orthopnea pnd or arm/hand swelling  or leg swelling, presyncope, palpitations, abdominal pain, anorexia, nausea, vomiting, diarrhea  or change in bowel habits or change in bladder habits, change in stools or change in urine, dysuria, hematuria,  rash, arthralgias, visual complaints, headache, numbness, weakness or ataxia or problems with walking or coordination,  change in mood or  memory.          Outpatient Medications Prior to Visit  Medication Sig Dispense Refill   acetaminophen  (TYLENOL ) 500 MG tablet Take 500 mg by mouth every 6 (six) hours as needed.      budesonide -formoterol  (SYMBICORT ) 80-4.5 MCG/ACT inhaler Inhale 2 puffs into the lungs in the morning and at bedtime. 1  each 12   famotidine  (PEPCID ) 20 MG tablet One after supper 30 tablet 11   omeprazole  (PRILOSEC) 40 MG capsule Take 30-60 min before first meal of the day 30 capsule 2   albuterol  (VENTOLIN  HFA) 108 (90 Base) MCG/ACT inhaler Inhale 1-2 puffs into the lungs every 6 (six) hours as needed for wheezing or shortness of breath. (Patient not taking: Reported on 01/03/2024) 54 g 0   budesonide -formoterol  (SYMBICORT ) 80-4.5 MCG/ACT inhaler Take 2 puffs first thing in am and then another 2 puffs about 12 hours later. 1 each 12   methylPREDNISolone  acetate (DEPO-MEDROL ) injection 120 mg      No facility-administered medications prior to visit.     Past Medical History:  Diagnosis Date   Acute meniscal tear of left knee    Alopecia    Anxiety    Arthritis    Chronic headaches    Depression    Eczema    GERD (gastroesophageal reflux disease)    Hemorrhoids    History of anal fissures    History of benign brain tumor    perpt history pseudotumor cerebri ,  per MRI 06-17-2000 no evidence  and MRI 11-07-2019  no evidence   History of gastric ulcer    per EGD non-bleeding  10-14-2020   History of idiopathic intracranial hypertension    yrs ago;  last MRI in epic 11-07-2019   History of kidney stones    Irritable bowel syndrome with constipation    Multinodular non-toxic goiter    followed by pcp----   hx benign bx in 2017  and 2021   OSA (obstructive sleep apnea)    followed by dr dohmeier----   sleep study in epic 10-31-2019,  mild osa   (10-06-2021  per pt stopped used cpap approx 4 months due to chronic cough)   Renal cysts, acquired, bilateral    Upper airway cough syndrome    pulmologist--- dr [name] Roam            Objective:   Physical Exam   Wts  01/03/2024       292   12/05/2023       290  06/17/2021          290 02/18/2014          258   01/21/14 254 lb  (115.214 kg)  01/09/14 256 lb 9.6 oz (116.393 kg)  11/15/13 248 lb 12.8 oz (112.855 kg)    Vital signs reviewed  01/03/2024  - Note at rest 02 sats  99% on RA   General appearance:    amb MO (by bmi) bf nad       HEENT : Oropharynx  clear      Nasal turbinates nl    NECK :  without  apparent JVD/ palpable Nodes/ Prob thyromegaly    LUNGS: no acc muscle use,  Nl contour chest which is clear to A and P bilaterally with  on insp  maneuvers   CV:  RRR  no s3 or murmur or increase in P2, and no edema   ABD:  soft and nontender   MS:  Gait nl   ext warm without deformities Or obvious joint restrictions  calf tenderness, cyanosis or clubbing    SKIN: warm and dry without lesions    NEURO:  alert, approp, nl sensorium with  no motor or cerebellar deficits apparent.              Assessment & Plan:  Assessment & Plan Chronic cough Resolved in 2016 on gerd rx and off all inhalers - recurred Jan 2023 responded best to symbicort  80 - Allergy screen 06/17/2021 >  Eos 0.2 /  IgE 396  -  06/17/2021 trial of just symbicort  80 2bid and 1st gen H1 blockers per guidelines  > resolved - recurred in [place]  July 2025 > ER start symb 80/ventolin  improved and flared off symbicort  80 around 11/10 / 25 so rx doxy/tessalon  200 on 12/04/23  - 12/05/2023  After extensive coaching inhaler device,  effectiveness =    75% for hfa (SHORT ti) with compoent of upper airway wheeze ? From TM - Allergy screen  12/05/23 >  Eos 0.3 /  IgE  328  - recurred 01/02/24 p pfts that were wnl x for low erv   Rec: >>>  Repeat cyclical cough protocol with tramadol  and continue gerd rx at least until cough gone plus one extra week   >>> not sure symbicort  80 is adding anything at this point bur for now rec she stay on this to see if cough continues to recur on a good empirical asthma rx in low doses     DOE (dyspnea on exertion) 02/18/2014  Walked RA x 3 laps @ 185 ft each stopped due to  End of study, no sob or  desat, moderate pace  02/18/14 spirometry p walking completely wnl off all resp rx  - 12/05/2023 worse in setting of ? URI onset 11/21/23 rx doxy/tessalon  - DOE labst 01/04/24 neg  -PFTs  01/02/24 nl except for ERV 31% % wt 291 p ? Prior to study  and no insp plateau   Nothing to suggest upper airway obst from TM  with low ERV c/w effects of MO on her lung volumes    Morbid obesity due to excess calories (HCC) Body mass index is 47.19 kg/m.  -  trending up slightly Lab Results  Component Value Date   TSH 0.87 06/05/2020   Contributing to doe and risk of GERD/dvt/ PE  >>>   reviewed the need and the process to achieve and maintain neg calorie balance > defer f/u primary care including intermittently monitoring thyroid  status            Each maintenance medication was reviewed in detail including emphasizing most importantly the difference between maintenance and prns and under what circumstances the prns are to be triggered using an action plan format where appropriate.  Total time for H and P, chart review, counseling, reviewing hfa device(s) and generating customized AVS unique to this office visit / same day charting = 32 min summary f/u ov         AVS  Patient Instructions   Omeprazole  40 mg   Take  30-60 min before first meal of the day and Pepcid  (famotidine )  20 mg after supper until no cough for a week off cough medication     Symbicort  80 Take 2 puffs first thing in am and then another 2 puffs about 12 hours later ()do not stop)     For drainage / throat tickle try take CHLORPHENIRAMINE  4 mg  (Allergy Relief 4mg   at [facility] should be easiest to find in the blue box usually on bottom shelf)  take one every 4 hours as needed - extremely effective and inexpensive over the counter- may cause drowsiness so start with just a dose or two an hour before bedtime and see how you tolerate it before trying  in daytime.   For cough > tramadol  50 mg every 4 hours as needed    GERD (REFLUX)  is an extremely common cause of respiratory symptoms just like yours , many times with no obvious heartburn at all.    It can be treated with medication, but also with lifestyle changes including elevation of the head of your bed (ideally with 6 -8inch blocks under the headboard of your bed),  Smoking cessation, avoidance of late meals, excessive alcohol, and avoid fatty foods, chocolate, peppermint, colas, red wine, and acidic juices such as orange juice.  NO MINT OR MENTHOL  PRODUCTS SO NO COUGH DROPS  - [employer] based  USE SUGARLESS CANDY INSTEAD (Jolley ranchers or Stover[employer] or [employer]) or even ice chips will also do - the key is to swallow to prevent all throat clearing. NO OIL BASED VITAMINS - use powdered substitutes.  Avoid fish oil when coughing.   Depomedrol 120 mg IM      Ozell America, MD 01/04/2024                  "

## 2024-01-03 NOTE — Patient Instructions (Addendum)
"   Omeprazole  40 mg   Take  30-60 min before first meal of the day and Pepcid  (famotidine )  20 mg after supper until no cough for a week off cough medication     Symbicort  80 Take 2 puffs first thing in am and then another 2 puffs about 12 hours later ()do not stop)     For drainage / throat tickle try take CHLORPHENIRAMINE  4 mg  (Allergy Relief 4mg   at [facility] should be easiest to find in the blue box usually on bottom shelf)  take one every 4 hours as needed - extremely effective and inexpensive over the counter- may cause drowsiness so start with just a dose or two an hour before bedtime and see how you tolerate it before trying in daytime.   For cough > tramadol  50 mg every 4 hours as needed   GERD (REFLUX)  is an extremely common cause of respiratory symptoms just like yours , many times with no obvious heartburn at all.    It can be treated with medication, but also with lifestyle changes including elevation of the head of your bed (ideally with 6 -8inch blocks under the headboard of your bed),  Smoking cessation, avoidance of late meals, excessive alcohol, and avoid fatty foods, chocolate, peppermint, colas, red wine, and acidic juices such as orange juice.  NO MINT OR MENTHOL  PRODUCTS SO NO COUGH DROPS  - [employer] based  USE SUGARLESS CANDY INSTEAD (Jolley ranchers or [employer] or [employer]) or even ice chips will also do - the key is to swallow to prevent all throat clearing. NO OIL BASED VITAMINS - use powdered substitutes.  Avoid fish oil when coughing.   Depomedrol 120 mg IM    "

## 2024-01-04 NOTE — Assessment & Plan Note (Addendum)
 Resolved in 2016 on gerd rx and off all inhalers - recurred Jan 2023 responded best to symbicort  80 - Allergy screen 06/17/2021 >  Eos 0.2 /  IgE 396  -  06/17/2021 trial of just symbicort  80 2bid and 1st gen H1 blockers per guidelines  > resolved - recurred in [place]  July 2025 > ER start symb 80/ventolin  improved and flared off symbicort  80 around 11/10 / 25 so rx doxy/tessalon  200 on 12/04/23  - 12/05/2023  After extensive coaching inhaler device,  effectiveness =    75% for hfa (SHORT ti) with compoent of upper airway wheeze ? From TM - Allergy screen  12/05/23 >  Eos 0.3 /  IgE  328  - recurred 01/02/24 p pfts that were wnl x for low erv   Rec: >>>  Repeat cyclical cough protocol with tramadol  and continue gerd rx at least until cough gone plus one extra week   >>> not sure symbicort  80 is adding anything at this point bur for now rec she stay on this to see if cough continues to recur on a good empirical asthma rx in low doses

## 2024-01-04 NOTE — Assessment & Plan Note (Addendum)
 02/18/2014  Walked RA x 3 laps @ 185 ft each stopped due to  End of study, no sob or desat, moderate pace  02/18/14 spirometry p walking completely wnl off all resp rx  - 12/05/2023 worse in setting of ? URI onset 11/21/23 rx doxy/tessalon  - DOE labst 01/04/24 neg  -PFTs  01/02/24 nl except for ERV 31% % wt 291 p ? Prior to study  and no insp plateau   Nothing to suggest upper airway obst from TM  with low ERV c/w effects of MO on her lung volumes

## 2024-01-04 NOTE — Assessment & Plan Note (Addendum)
 Body mass index is 47.19 kg/m.  -  trending up slightly Lab Results  Component Value Date   TSH 0.87 06/05/2020   Contributing to doe and risk of GERD/dvt/ PE  >>>   reviewed the need and the process to achieve and maintain neg calorie balance > defer f/u primary care including intermittently monitoring thyroid  status            Each maintenance medication was reviewed in detail including emphasizing most importantly the difference between maintenance and prns and under what circumstances the prns are to be triggered using an action plan format where appropriate.  Total time for H and P, chart review, counseling, reviewing hfa device(s) and generating customized AVS unique to this office visit / same day charting = 32 min summary f/u ov

## 2024-01-06 ENCOUNTER — Ambulatory Visit: Payer: Self-pay | Admitting: Internal Medicine

## 2024-01-11 NOTE — Telephone Encounter (Signed)
ATC x2.  LVM to return call.  Mychart message sent as well.

## 2024-02-21 ENCOUNTER — Ambulatory Visit: Admitting: Internal Medicine
